# Patient Record
Sex: Female | Born: 1980
Health system: Southern US, Community
[De-identification: ages and names within clinical notes are randomized; demographics above are authoritative.]

## PROBLEM LIST (undated history)

## (undated) DIAGNOSIS — F419 Anxiety disorder, unspecified: Secondary | ICD-10-CM

## (undated) DIAGNOSIS — J309 Allergic rhinitis, unspecified: Secondary | ICD-10-CM

## (undated) DIAGNOSIS — R51 Headache: Secondary | ICD-10-CM

## (undated) DIAGNOSIS — M1990 Unspecified osteoarthritis, unspecified site: Secondary | ICD-10-CM

## (undated) DIAGNOSIS — J45909 Unspecified asthma, uncomplicated: Secondary | ICD-10-CM

## (undated) DIAGNOSIS — F3289 Other specified depressive episodes: Secondary | ICD-10-CM

## (undated) DIAGNOSIS — R000 Tachycardia, unspecified: Secondary | ICD-10-CM

## (undated) DIAGNOSIS — F329 Major depressive disorder, single episode, unspecified: Secondary | ICD-10-CM

## (undated) DIAGNOSIS — F1010 Alcohol abuse, uncomplicated: Secondary | ICD-10-CM

## (undated) HISTORY — DX: Other specified depressive episodes: F32.89

## (undated) HISTORY — PX: WISDOM TOOTH EXTRACTION: SHX21

## (undated) HISTORY — DX: Alcohol abuse, uncomplicated: F10.10

## (undated) HISTORY — DX: Unspecified asthma, uncomplicated: J45.909

## (undated) HISTORY — DX: Headache: R51

## (undated) HISTORY — DX: Major depressive disorder, single episode, unspecified: F32.9

## (undated) HISTORY — DX: Tachycardia, unspecified: R00.0

## (undated) HISTORY — DX: Allergic rhinitis, unspecified: J30.9

## (undated) HISTORY — DX: Unspecified osteoarthritis, unspecified site: M19.90

---

## 1998-11-02 ENCOUNTER — Other Ambulatory Visit: Admission: RE | Admit: 1998-11-02 | Discharge: 1998-11-02 | Payer: Self-pay | Admitting: *Deleted

## 2001-04-04 ENCOUNTER — Emergency Department (HOSPITAL_COMMUNITY): Admission: EM | Admit: 2001-04-04 | Discharge: 2001-04-04 | Payer: Self-pay | Admitting: Emergency Medicine

## 2004-02-17 ENCOUNTER — Ambulatory Visit: Payer: Self-pay | Admitting: *Deleted

## 2004-04-28 ENCOUNTER — Ambulatory Visit: Payer: Self-pay | Admitting: Family Medicine

## 2004-07-06 ENCOUNTER — Ambulatory Visit: Payer: Self-pay | Admitting: Family Medicine

## 2004-08-25 ENCOUNTER — Ambulatory Visit: Payer: Self-pay | Admitting: Internal Medicine

## 2004-09-11 ENCOUNTER — Emergency Department (HOSPITAL_COMMUNITY): Admission: EM | Admit: 2004-09-11 | Discharge: 2004-09-11 | Payer: Self-pay | Admitting: Family Medicine

## 2004-09-14 ENCOUNTER — Ambulatory Visit: Payer: Self-pay | Admitting: Family Medicine

## 2004-10-05 ENCOUNTER — Ambulatory Visit: Payer: Self-pay | Admitting: Family Medicine

## 2004-11-04 ENCOUNTER — Emergency Department (HOSPITAL_COMMUNITY): Admission: EM | Admit: 2004-11-04 | Discharge: 2004-11-04 | Payer: Self-pay | Admitting: Emergency Medicine

## 2004-11-08 ENCOUNTER — Ambulatory Visit: Payer: Self-pay | Admitting: Family Medicine

## 2005-05-01 ENCOUNTER — Emergency Department (HOSPITAL_COMMUNITY): Admission: EM | Admit: 2005-05-01 | Discharge: 2005-05-01 | Payer: Self-pay | Admitting: Emergency Medicine

## 2005-08-01 ENCOUNTER — Emergency Department (HOSPITAL_COMMUNITY): Admission: EM | Admit: 2005-08-01 | Discharge: 2005-08-01 | Payer: Self-pay | Admitting: Family Medicine

## 2006-03-17 ENCOUNTER — Emergency Department (HOSPITAL_COMMUNITY): Admission: EM | Admit: 2006-03-17 | Discharge: 2006-03-17 | Payer: Self-pay | Admitting: Emergency Medicine

## 2006-06-21 ENCOUNTER — Ambulatory Visit (HOSPITAL_COMMUNITY): Admission: RE | Admit: 2006-06-21 | Discharge: 2006-06-21 | Payer: Self-pay | Admitting: Internal Medicine

## 2007-01-27 ENCOUNTER — Emergency Department (HOSPITAL_COMMUNITY): Admission: EM | Admit: 2007-01-27 | Discharge: 2007-01-28 | Payer: Self-pay | Admitting: Emergency Medicine

## 2008-11-20 ENCOUNTER — Encounter: Admission: RE | Admit: 2008-11-20 | Discharge: 2009-02-18 | Payer: Self-pay | Admitting: Specialist

## 2010-04-19 ENCOUNTER — Ambulatory Visit: Payer: Self-pay | Admitting: Internal Medicine

## 2010-04-19 DIAGNOSIS — F418 Other specified anxiety disorders: Secondary | ICD-10-CM | POA: Insufficient documentation

## 2010-04-19 DIAGNOSIS — J309 Allergic rhinitis, unspecified: Secondary | ICD-10-CM | POA: Insufficient documentation

## 2010-04-19 DIAGNOSIS — M1990 Unspecified osteoarthritis, unspecified site: Secondary | ICD-10-CM | POA: Insufficient documentation

## 2010-04-19 DIAGNOSIS — R519 Headache, unspecified: Secondary | ICD-10-CM | POA: Insufficient documentation

## 2010-04-19 DIAGNOSIS — R51 Headache: Secondary | ICD-10-CM

## 2010-04-19 DIAGNOSIS — J4520 Mild intermittent asthma, uncomplicated: Secondary | ICD-10-CM | POA: Insufficient documentation

## 2010-06-15 NOTE — Assessment & Plan Note (Signed)
Summary: NEW PT/UMR/#/LB   Vital Signs:  Patient profile:   30 year old female Menstrual status:  regular LMP:     04/12/2010 Height:      59 inches Weight:      177 pounds BMI:     35.88 O2 Sat:      97 % on Room air Temp:     98.3 degrees F oral Pulse rate:   98 / minute Pulse rhythm:   regular Resp:     16 per minute BP sitting:   112 / 82  (left arm) Cuff size:   regular  Vitals Entered By: Rock Nephew CMA (April 19, 2010 10:52 AM)  Nutrition Counseling: Patient's BMI is greater than 25 and therefore counseled on weight management options.  O2 Flow:  Room air CC: new to establish Is Patient Diabetic? No  Does patient need assistance? Functional Status Self care Ambulation Normal LMP (date): 04/12/2010     Menstrual Status regular Enter LMP: 04/12/2010 Last PAP Result normal   Primary Care Provider:  Etta Grandchild MD  CC:  new to establish.  History of Present Illness: New to me for evaluation and treatment of depression. She has been declining this year after her brother died in 01-25-2011from cirrhosis induced by alcohol abuse. She has not done any therapy this year. Her prior PCP increased the dose of Effexor 4 months ago but she still has crying spells, fatigue, weight gain, and hypersomnolence.  Asthma History    Initial Asthma Severity Rating:    Age range: 12+ years    Symptoms: 0-2 days/week    Nighttime Awakenings: 0-2/month    Interferes w/ normal activity: no limitations    SABA use (not for EIB): 0-2 days/week    Asthma Severity Assessment: Intermittent  Preventive Screening-Counseling & Management  Alcohol-Tobacco     Alcohol drinks/day: 0     Alcohol Counseling: not indicated; patient does not drink     Smoking Status: never     Tobacco Counseling: not indicated; no tobacco use  Caffeine-Diet-Exercise     Does Patient Exercise: no  Hep-HIV-STD-Contraception     Hepatitis Risk: no risk noted     HIV Risk: no risk noted  STD Risk: no risk noted      Sexual History:  currently monogamous.        Drug Use:  no.        Blood Transfusions:  no.    Medications Prior to Update: 1)  None  Current Medications (verified): 1)  Singulair 10 Mg Tabs (Montelukast Sodium) .... Take 1 Tablet By Mouth Once A Day 2)  Zolpidem Tartrate 10 Mg Tabs (Zolpidem Tartrate) .... Take 1 Tab By Mouth At Bedtime 3)  Venlafaxine Hcl 225 Mg Xr24h-Tab (Venlafaxine Hcl) .... Take 1 Tablet By Mouth Once A Day 4)  Vitamin D 2000 Unit Tabs (Cholecalciferol) .... Take 1 Tablet By Mouth Once A Day 5)  Sprintec 28 0.25-35 Mg-Mcg Tabs (Norgestimate-Eth Estradiol) 6)  Budeprion Xl 150 Mg Xr24h-Tab (Bupropion Hcl) .... One By Mouth Once Daily For Depression  Allergies (verified): 1)  ! Penicillin  Past History:  Past Medical History: Allergic rhinitis Asthma Depression Headache Osteoarthritis  Past Surgical History: Denies surgical history  Family History: Family History of Alcoholism/Addiction Family History of Arthritis  Social History: Occupation: Research scientist (medical) Married Never Smoked Alcohol use-no Drug use-no Regular exercise-no Smoking Status:  never Hepatitis Risk:  no risk noted HIV Risk:  no risk noted STD  Risk:  no risk noted Sexual History:  currently monogamous Blood Transfusions:  no Drug Use:  no Does Patient Exercise:  no  Review of Systems       The patient complains of weight gain and depression.  The patient denies anorexia, fever, weight loss, chest pain, syncope, dyspnea on exertion, peripheral edema, prolonged cough, headaches, hemoptysis, abdominal pain, hematuria, suspicious skin lesions, unusual weight change, abnormal bleeding, and enlarged lymph nodes.   Resp:  Denies chest pain with inspiration, cough, coughing up blood, excessive snoring, hypersomnolence, pleuritic, shortness of breath, sputum productive, and wheezing. Psych:  Complains of depression, easily tearful, and irritability; denies  alternate hallucination ( auditory/visual), anxiety, easily angered, mental problems, panic attacks, sense of great danger, suicidal thoughts/plans, thoughts of violence, unusual visions or sounds, and thoughts /plans of harming others.  Physical Exam  General:  alert, well-developed, well-nourished, well-hydrated, appropriate dress, normal appearance, healthy-appearing, cooperative to examination, good hygiene, and overweight-appearing.   Head:  normocephalic, atraumatic, no abnormalities observed, and no abnormalities palpated.   Mouth:  good dentition and pharynx pink and moist.   Neck:  supple, full ROM, no masses, no thyromegaly, no thyroid nodules or tenderness, no JVD, normal carotid upstroke, no carotid bruits, no cervical lymphadenopathy, and no neck tenderness.   Lungs:  normal respiratory effort, no intercostal retractions, no accessory muscle use, normal breath sounds, no dullness, no fremitus, no crackles, and no wheezes.   Heart:  normal rate, regular rhythm, no murmur, no gallop, no rub, and no JVD.   Abdomen:  soft, non-tender, normal bowel sounds, no distention, no masses, no guarding, no rigidity, no rebound tenderness, no abdominal hernia, no inguinal hernia, no hepatomegaly, and no splenomegaly.   Msk:  normal ROM, no joint tenderness, no joint swelling, no joint warmth, no redness over joints, no joint deformities, no joint instability, and no crepitation.   Pulses:  R and L carotid,radial,femoral,dorsalis pedis and posterior tibial pulses are full and equal bilaterally Extremities:  No clubbing, cyanosis, edema, or deformity noted with normal full range of motion of all joints.   Neurologic:  No cranial nerve deficits noted. Station and gait are normal. Plantar reflexes are down-going bilaterally. DTRs are symmetrical throughout. Sensory, motor and coordinative functions appear intact. Skin:  Intact without suspicious lesions or rashes Cervical Nodes:  no anterior cervical  adenopathy and no posterior cervical adenopathy.   Psych:  Oriented X3, memory intact for recent and remote, normally interactive, good eye contact, not anxious appearing, not agitated, not suicidal, not homicidal, subdued, and tearful.     Impression & Recommendations:  Problem # 1:  DEPRESSION (ICD-311) Assessment Deteriorated  she was referred for psuchotherapy with Burnard Hawthorne The following medications were removed from the medication list:    Budeprion Sr 100 Mg Xr12h-tab (Bupropion hcl) .Marland Kitchen... Take 1 tablet by mouth once a day Her updated medication list for this problem includes:    Venlafaxine Hcl 225 Mg Xr24h-tab (Venlafaxine hcl) .Marland Kitchen... Take 1 tablet by mouth once a day    Budeprion Xl 150 Mg Xr24h-tab (Bupropion hcl) ..... One by mouth once daily for depression  Discussed treatment options, including trial of antidpressant medication. Will refer to behavioral health. Follow-up call in in 24-48 hours and recheck in 2 weeks, sooner as needed. Patient agrees to call if any worsening of symptoms or thoughts of doing harm arise. Verified that the patient has no suicidal ideation at this time.   Problem # 2:  ASTHMA (ICD-493.90) Assessment: Improved  Her  updated medication list for this problem includes:    Singulair 10 Mg Tabs (Montelukast sodium) .Marland Kitchen... Take 1 tablet by mouth once a day  Pulmonary Functions Reviewed: O2 sat: 97 (04/19/2010)  Complete Medication List: 1)  Singulair 10 Mg Tabs (Montelukast sodium) .... Take 1 tablet by mouth once a day 2)  Zolpidem Tartrate 10 Mg Tabs (Zolpidem tartrate) .... Take 1 tab by mouth at bedtime 3)  Venlafaxine Hcl 225 Mg Xr24h-tab (Venlafaxine hcl) .... Take 1 tablet by mouth once a day 4)  Vitamin D 2000 Unit Tabs (Cholecalciferol) .... Take 1 tablet by mouth once a day 5)  Sprintec 28 0.25-35 Mg-mcg Tabs (Norgestimate-eth estradiol) 6)  Budeprion Xl 150 Mg Xr24h-tab (Bupropion hcl) .... One by mouth once daily for  depression  Other Orders: Tdap => 79yrs IM (16109) Admin 1st Vaccine (60454)   Patient Instructions: 1)  Please schedule a follow-up appointment in 3 months. 2)  It is important that you exercise regularly at least 20 minutes 5 times a week. If you develop chest pain, have severe difficulty breathing, or feel very tired , stop exercising immediately and seek medical attention. 3)  You need to lose weight. Consider a lower calorie diet and regular exercise.  Prescriptions: BUDEPRION XL 150 MG XR24H-TAB (BUPROPION HCL) one by mouth once daily for depression  #30 x 11   Entered and Authorized by:   Etta Grandchild MD   Signed by:   Etta Grandchild MD on 04/19/2010   Method used:   Print then Give to Patient   RxID:   [id number]    Orders Added: 1)  Tdap => 38yrs IM [30865] 2)  Admin 1st Vaccine [90471] 3)  New Patient Level IV [78469]   Immunizations Administered:  Tetanus Vaccine:    Vaccine Type: Tdap    Site: right deltoid    Mfr: GlaxoSmithKline    Dose: 0.5 ml    Route: IM    Given by: Rock Nephew CMA    Exp. Date: 03/04/2012    Lot #: [id number]    VIS given: 04/02/08 version given April 19, 2010.   Immunizations Administered:  Tetanus Vaccine:    Vaccine Type: Tdap    Site: right deltoid    Mfr: [email or web address]    Dose: 0.5 ml    Route: IM    Given by: Rock Nephew CMA    Exp. Date: 03/04/2012    Lot #: [id number]    VIS given: 04/02/08 version given April 19, 2010.  Preventive Care Screening  Pap Smear:    Date:  12/14/2009    Results:  normal

## 2010-06-16 ENCOUNTER — Telehealth: Payer: Self-pay | Admitting: Internal Medicine

## 2010-06-18 ENCOUNTER — Ambulatory Visit: Payer: Self-pay | Admitting: Internal Medicine

## 2010-06-21 ENCOUNTER — Other Ambulatory Visit: Payer: Self-pay | Admitting: Internal Medicine

## 2010-06-21 ENCOUNTER — Other Ambulatory Visit: Payer: Commercial Managed Care - PPO

## 2010-06-21 ENCOUNTER — Encounter (INDEPENDENT_AMBULATORY_CARE_PROVIDER_SITE_OTHER): Payer: Self-pay | Admitting: *Deleted

## 2010-06-21 ENCOUNTER — Ambulatory Visit (INDEPENDENT_AMBULATORY_CARE_PROVIDER_SITE_OTHER): Payer: Commercial Managed Care - PPO | Admitting: Internal Medicine

## 2010-06-21 ENCOUNTER — Encounter: Payer: Self-pay | Admitting: Internal Medicine

## 2010-06-21 DIAGNOSIS — R112 Nausea with vomiting, unspecified: Secondary | ICD-10-CM | POA: Insufficient documentation

## 2010-06-21 DIAGNOSIS — F329 Major depressive disorder, single episode, unspecified: Secondary | ICD-10-CM

## 2010-06-21 DIAGNOSIS — R000 Tachycardia, unspecified: Secondary | ICD-10-CM | POA: Insufficient documentation

## 2010-06-21 DIAGNOSIS — K5289 Other specified noninfective gastroenteritis and colitis: Secondary | ICD-10-CM | POA: Insufficient documentation

## 2010-06-21 LAB — HEPATIC FUNCTION PANEL
ALT: 22 U/L (ref 0–35)
AST: 23 U/L (ref 0–37)
Alkaline Phosphatase: 79 U/L (ref 39–117)
Bilirubin, Direct: 0.1 mg/dL (ref 0.0–0.3)
Total Bilirubin: 0.2 mg/dL — ABNORMAL LOW (ref 0.3–1.2)

## 2010-06-21 LAB — CBC WITH DIFFERENTIAL/PLATELET
Eosinophils Relative: 1.4 % (ref 0.0–5.0)
HCT: 39.3 % (ref 36.0–46.0)
Lymphocytes Relative: 20.1 % (ref 12.0–46.0)
Lymphs Abs: 1.4 10*3/uL (ref 0.7–4.0)
Monocytes Relative: 4.3 % (ref 3.0–12.0)
Platelets: 325 10*3/uL (ref 150.0–400.0)
WBC: 7 10*3/uL (ref 4.5–10.5)

## 2010-06-21 LAB — BASIC METABOLIC PANEL
BUN: 4 mg/dL — ABNORMAL LOW (ref 6–23)
GFR: 88.86 mL/min (ref >60.00)
Potassium: 4.3 mEq/L (ref 3.5–5.1)
Sodium: 138 mEq/L (ref 135–145)

## 2010-06-21 LAB — TSH: TSH: 0.6 u[IU]/mL (ref 0.35–5.50)

## 2010-06-23 NOTE — Progress Notes (Signed)
Summary: Nausea  Phone Note Call from Patient   Summary of Call: Pt c/o nausea and will vomit if she eats any solids. Please advise.  Initial call taken by: Lamar Sprinkles, CMA,  June 16, 2010 5:00 PM  Follow-up for Phone Call        needs to be seen Follow-up by: Etta Grandchild MD,  June 16, 2010 5:02 PM  Additional Follow-up for Phone Call Additional follow up Details #1::        Pt informed, due to work she is unable to come into the office until Monday. Advised she keep a bland diet and avoid high acid liquids/foods. Also strongly suggested we needed to see her for eval soon and not to wait until Monday. Pt says she has nausea even w/small sips of liquids. Stressed eval and offered sooner apts - pt declined again.  Additional Follow-up by: Lamar Sprinkles, CMA,  June 16, 2010 5:36 PM

## 2010-07-01 NOTE — Assessment & Plan Note (Signed)
Summary: nausea/#   Vital Signs:  Patient profile:   30 year old female Menstrual status:  regular LMP:     06/14/2010 Height:      59 inches Weight:      169.75 pounds BMI:     34.41 O2 Sat:      97 % on Room air Temp:     98.4 degrees F oral Pulse rate:   116 / minute Pulse rhythm:   regular Resp:     16 per minute BP sitting:   102 / 70  (left arm) Cuff size:   regular  Vitals Entered By: Rock Nephew CMA (June 21, 2010 8:34 AM)  Nutrition Counseling: Patient's BMI is greater than 25 and therefore counseled on weight management options.  O2 Flow:  Room air CC: Patient c/o nausea and vomiting x last week Pain Assessment Patient in pain? no       Does patient need assistance? Functional Status Self care Ambulation Normal LMP (date): 06/14/2010     Enter LMP: 06/14/2010 Last PAP Result normal   Primary Care Provider:  Etta Grandchild MD  CC:  Patient c/o nausea and vomiting x last week.  History of Present Illness: She returns c/o a 5 day history of nausea with occasional vomiting. She tells me that her husband has had a stomach flu.  Also, she feels shaky and wants to lower the doses of her meds.  Preventive Screening-Counseling & Management  Alcohol-Tobacco     Alcohol drinks/day: 0     Alcohol Counseling: not indicated; patient does not drink     Smoking Status: never     Tobacco Counseling: not indicated; no tobacco use  Hep-HIV-STD-Contraception     Hepatitis Risk: no risk noted     HIV Risk: no risk noted     STD Risk: no risk noted      Sexual History:  currently monogamous.        Drug Use:  no.        Blood Transfusions:  no.    Medications Prior to Update: 1)  Singulair 10 Mg Tabs (Montelukast Sodium) .... Take 1 Tablet By Mouth Once A Day 2)  Zolpidem Tartrate 10 Mg Tabs (Zolpidem Tartrate) .... Take 1 Tab By Mouth At Bedtime 3)  Venlafaxine Hcl 225 Mg Xr24h-Tab (Venlafaxine Hcl) .... Take 1 Tablet By Mouth Once A Day 4)   Vitamin D 2000 Unit Tabs (Cholecalciferol) .... Take 1 Tablet By Mouth Once A Day 5)  Sprintec 28 0.25-35 Mg-Mcg Tabs (Norgestimate-Eth Estradiol) 6)  Budeprion Xl 150 Mg Xr24h-Tab (Bupropion Hcl) .... One By Mouth Once Daily For Depression  Current Medications (verified): 1)  Singulair 10 Mg Tabs (Montelukast Sodium) .... Take 1 Tablet By Mouth Once A Day 2)  Zolpidem Tartrate 10 Mg Tabs (Zolpidem Tartrate) .... Take 1 Tab By Mouth At Bedtime 3)  Vitamin D 2000 Unit Tabs (Cholecalciferol) .... Take 1 Tablet By Mouth Once A Day 4)  Sprintec 28 0.25-35 Mg-Mcg Tabs (Norgestimate-Eth Estradiol) 5)  Budeprion Xl 150 Mg Xr24h-Tab (Bupropion Hcl) .... One By Mouth Once Daily For Depression 6)  Propranolol .... As Needed For Headaches 7)  Venlafaxine Hcl 150 Mg Xr24h-Tab (Venlafaxine Hcl) .... One By Mouth Once Daily 8)  Promethazine Hcl 25 Mg Tabs (Promethazine Hcl) .... 1/2-1 By Mouth Qid As Needed For Nausea or Vomiting  Allergies (verified): 1)  ! Penicillin  Past History:  Past Medical History: Last updated: 04/19/2010 Allergic rhinitis Asthma Depression Headache  Osteoarthritis  Past Surgical History: Last updated: 04/19/2010 Denies surgical history  Family History: Last updated: 04/19/2010 Family History of Alcoholism/Addiction Family History of Arthritis  Social History: Last updated: 04/19/2010 Occupation: dog groomer Married Never Smoked Alcohol use-no Drug use-no Regular exercise-no  Risk Factors: Alcohol Use: 0 (06/21/2010) Exercise: no (04/19/2010)  Risk Factors: Smoking Status: never (06/21/2010)  Family History: Reviewed history from 04/19/2010 and no changes required. Family History of Alcoholism/Addiction Family History of Arthritis  Social History: Reviewed history from 04/19/2010 and no changes required. 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   GI:  Complains of nausea and vomiting; denies abdominal pain, bloody stools, change in bowel habits, diarrhea, excessive appetite, gas, hemorrhoids, indigestion, loss of appetite, vomiting blood, and yellowish skin color. Psych:  Complains of depression; denies alternate hallucination ( auditory/visual), anxiety, easily angered, easily tearful, irritability, mental problems, panic attacks, sense of great danger, suicidal thoughts/plans, thoughts of violence, unusual visions or sounds, and thoughts /plans of harming others.  Physical Exam  General:  alert, well-developed, well-nourished, well-hydrated, appropriate dress, normal appearance, healthy-appearing, cooperative to examination, good hygiene, and overweight-appearing.   Head:  normocephalic, atraumatic, no abnormalities observed, and no abnormalities palpated.   Eyes:  vision grossly intact, pupils equal, and no injection or injection. Mouth:  good dentition and pharynx pink and moist.   Neck:  supple, full ROM, no masses, no thyromegaly, no thyroid nodules or tenderness, no JVD, normal carotid upstroke, no carotid bruits, no cervical lymphadenopathy, and no neck tenderness.   Lungs:  normal respiratory effort, no intercostal retractions, no accessory muscle use, normal breath sounds, no dullness, no fremitus, no crackles, and no wheezes.   Heart:  normal rate, regular rhythm, no murmur, no gallop, no rub, and no JVD.   Abdomen:  soft, non-tender, normal bowel sounds, no distention, no masses, no guarding, no rigidity, no rebound tenderness, no abdominal hernia, no inguinal hernia, no hepatomegaly, and no splenomegaly.   Msk:  normal ROM, no joint tenderness, no  joint swelling, no joint warmth, no redness over joints, no joint deformities, no joint instability, and no crepitation.   Pulses:  R and L carotid,radial,femoral,dorsalis pedis and posterior tibial pulses are full and equal bilaterally Extremities:  No clubbing, cyanosis, edema, or deformity noted with normal full range of motion of all joints.   Neurologic:  No cranial nerve deficits noted. Station and gait are normal. Plantar reflexes are down-going bilaterally. DTRs are symmetrical throughout. Sensory, motor and coordinative functions appear intact. Skin:  turgor normal, color normal, no rashes, no suspicious lesions, no ecchymoses, no petechiae, no purpura, no ulcerations, and no edema.   Cervical Nodes:  no anterior cervical adenopathy and no posterior cervical adenopathy.   Axillary Nodes:  no R axillary adenopathy and no L axillary adenopathy.   Psych:  Oriented X3, memory intact for recent and remote, normally interactive, good eye contact, not anxious appearing, not depressed appearing, not agitated, not suicidal, and not homicidal.     Impression & Recommendations:  Problem # 1:  GASTROENTERITIS (ICD-558.9) Assessment New will try phenergan for symptoms and encourage increased fluid intake  Problem # 2:  UNSPECIFIED TACHYCARDIA (ICD-785.0)  Orders: Venipuncture (91478) TLB-BMP (Basic Metabolic Panel-BMET) (80048-METABOL) TLB-CBC Platelet -  w/Differential (85025-CBCD) TLB-Hepatic/Liver Function Pnl (80076-HEPATIC) TLB-TSH (Thyroid Stimulating Hormone) (84443-TSH) TLB-Amylase (82150-AMYL) TLB-Lipase (83690-LIPASE) TLB-Preg Serum Quant (B-hCG) (84702-HCG-QN)  Problem # 3:  NAUSEA AND VOMITING (ICD-787.01) will check labs for dehydration, anemia, thyroid disease, abnormal lytes, etc. Orders: Venipuncture (16109) TLB-BMP (Basic Metabolic Panel-BMET) (80048-METABOL) TLB-CBC Platelet - w/Differential (85025-CBCD) TLB-Hepatic/Liver Function Pnl (80076-HEPATIC) TLB-TSH (Thyroid  Stimulating Hormone) (84443-TSH) TLB-Amylase (82150-AMYL) TLB-Lipase (83690-LIPASE) TLB-Preg Serum Quant (B-hCG) (84702-HCG-QN)  Problem # 4:  DEPRESSION (ICD-311) Assessment: Unchanged  The following medications were removed from the medication list:    Venlafaxine Hcl 225 Mg Xr24h-tab (Venlafaxine hcl) .Marland Kitchen... Take 1 tablet by mouth once a day Her updated medication list for this problem includes:    Budeprion Xl 150 Mg Xr24h-tab (Bupropion hcl) ..... One by mouth once daily for depression    Venlafaxine Hcl 150 Mg Xr24h-tab (Venlafaxine hcl) ..... One by mouth once daily  Orders: Venipuncture (60454) TLB-BMP (Basic Metabolic Panel-BMET) (80048-METABOL) TLB-CBC Platelet - w/Differential (85025-CBCD) TLB-Hepatic/Liver Function Pnl (80076-HEPATIC) TLB-TSH (Thyroid Stimulating Hormone) (84443-TSH) TLB-Amylase (82150-AMYL) TLB-Lipase (83690-LIPASE) TLB-Preg Serum Quant (B-hCG) (84702-HCG-QN)  Discussed treatment options, including trial of antidpressant medication. Will refer to behavioral health. Follow-up call in in 24-48 hours and recheck in 2 weeks, sooner as needed. Patient agrees to call if any worsening of symptoms or thoughts of doing harm arise. Verified that the patient has no suicidal ideation at this time.   Complete Medication List: 1)  Singulair 10 Mg Tabs (Montelukast sodium) .... Take 1 tablet by mouth once a day 2)  Zolpidem Tartrate 10 Mg Tabs (Zolpidem tartrate) .... Take 1 tab by mouth at bedtime 3)  Vitamin D 2000 Unit Tabs (Cholecalciferol) .... Take 1 tablet by mouth once a day 4)  Sprintec 28 0.25-35 Mg-mcg Tabs (Norgestimate-eth estradiol) 5)  Budeprion Xl 150 Mg Xr24h-tab (Bupropion hcl) .... One by mouth once daily for depression 6)  Propranolol  .... As needed for headaches 7)  Venlafaxine Hcl 150 Mg Xr24h-tab (Venlafaxine hcl) .... One by mouth once daily 8)  Promethazine Hcl 25 Mg Tabs (Promethazine hcl) .... 1/2-1 by mouth qid as needed for nausea or  vomiting  Patient Instructions: 1)  Please schedule a follow-up appointment in 2 weeks. 2)  teh main problem with gastroenteritis is dehydration. Drink plenty of fluids and take solids as you feel better. If you are unable to keep anything down and/or you show signs of dehydration(dry/cracked lips, lack of tears, not urinating, very sleepy), call our office. Prescriptions: PROMETHAZINE HCL 25 MG TABS (PROMETHAZINE HCL) 1/2-1 by mouth QID as needed for nausea or vomiting  #20 x 0   Entered and Authorized by:   Etta Grandchild MD   Signed by:   Etta Grandchild MD on 06/21/2010   Method used:   Print then Give to Patient   RxID:   [id number] VENLAFAXINE HCL 150 MG XR24H-TAB (VENLAFAXINE HCL) One by mouth once daily  #30 x 11   Entered and Authorized by:   Etta Grandchild MD   Signed by:   Etta Grandchild MD on 06/21/2010   Method used:   Print then Give to Patient   RxID:   [id number]    Orders Added: 1)  Venipuncture [41324] 2)  TLB-BMP (Basic Metabolic Panel-BMET) [80048-METABOL] 3)  TLB-CBC Platelet - w/Differential [85025-CBCD] 4)  TLB-Hepatic/Liver Function Pnl [80076-HEPATIC] 5)  TLB-TSH (Thyroid Stimulating Hormone) [84443-TSH] 6)  TLB-Amylase [82150-AMYL] 7)  TLB-Lipase [83690-LIPASE] 8)  TLB-Preg Serum Quant (B-hCG) [84702-HCG-QN] 9)  Est. Patient Level IV [  99214] 

## 2010-07-23 ENCOUNTER — Encounter: Payer: Self-pay | Admitting: Internal Medicine

## 2010-07-23 ENCOUNTER — Ambulatory Visit (INDEPENDENT_AMBULATORY_CARE_PROVIDER_SITE_OTHER): Payer: Commercial Managed Care - PPO | Admitting: Internal Medicine

## 2010-07-23 DIAGNOSIS — J209 Acute bronchitis, unspecified: Secondary | ICD-10-CM

## 2010-07-26 ENCOUNTER — Telehealth: Payer: Self-pay | Admitting: Internal Medicine

## 2010-07-26 ENCOUNTER — Ambulatory Visit: Payer: Commercial Managed Care - PPO | Admitting: Internal Medicine

## 2010-07-27 NOTE — Assessment & Plan Note (Signed)
Summary: NAUSEA / HOARSNESS /NWS   Vital Signs:  Patient profile:   30 year old female Menstrual status:  regular LMP:     07/06/2010 Height:      59 inches Weight:      170 pounds BMI:     34.46 O2 Sat:      97 % on Room air Temp:     98.8 degrees F oral Pulse rate:   94 / minute Pulse rhythm:   regular Resp:     16 per minute BP sitting:   104 / 70  (left arm) Cuff size:   large  Vitals Entered By: Rock Nephew CMA (July 23, 2010 8:25 AM)  Nutrition Counseling: Patient's BMI is greater than 25 and therefore counseled on weight management options.  O2 Flow:  Room air  Primary Care Provider:  Etta Grandchild MD  CC:  URI symptoms.  History of Present Illness:  URI Symptoms      This is a 30 year old woman who presents with URI symptoms.  The symptoms began 3 days ago.  The severity is described as moderate.  The patient reports sore throat, productive cough, and sick contacts, but denies nasal congestion, clear nasal discharge, purulent nasal discharge, dry cough, and earache.  Associated symptoms include low-grade fever (<100.5 degrees).  The patient denies stiff neck, dyspnea, wheezing, rash, vomiting, diarrhea, use of an antipyretic, and response to antipyretic.  The patient also reports severe fatigue.  The patient denies itchy watery eyes, itchy throat, sneezing, seasonal symptoms, response to antihistamine, headache, and muscle aches.  The patient denies the following risk factors for Strep sinusitis: unilateral facial pain, unilateral nasal discharge, poor response to decongestant, double sickening, tooth pain, Strep exposure, tender adenopathy, and absence of cough.    Preventive Screening-Counseling & Management  Alcohol-Tobacco     Alcohol drinks/day: 0     Alcohol Counseling: not indicated; patient does not drink     Smoking Status: never     Tobacco Counseling: not indicated; no tobacco use  Hep-HIV-STD-Contraception     Hepatitis Risk: no risk noted     HIV  Risk: no risk noted     STD Risk: no risk noted      Sexual History:  currently monogamous.        Drug Use:  no.        Blood Transfusions:  no.    Clinical Review Panels:  Prevention   Last Pap Smear:  normal (12/14/2009)  Immunizations   Last Tetanus Booster:  Tdap (04/19/2010)  Diabetes Management   Creatinine:  0.8 (06/21/2010)  CBC   WBC:  7.0 (06/21/2010)   RBC:  4.39 (06/21/2010)   Hgb:  13.4 (06/21/2010)   Hct:  39.3 (06/21/2010)   Platelets:  325.0 (06/21/2010)   MCV  89.5 (06/21/2010)   MCHC  34.1 (06/21/2010)   RDW  13.2 (06/21/2010)   PMN:  73.5 (06/21/2010)   Lymphs:  20.1 (06/21/2010)   Monos:  4.3 (06/21/2010)   Eosinophils:  1.4 (06/21/2010)   Basophil:  0.7 (06/21/2010)  Complete Metabolic Panel   Glucose:  97 (06/21/2010)   Sodium:  138 (06/21/2010)   Potassium:  4.3 (06/21/2010)   Chloride:  104 (06/21/2010)   CO2:  27 (06/21/2010)   BUN:  4 (06/21/2010)   Creatinine:  0.8 (06/21/2010)   Albumin:  3.6 (06/21/2010)   Total Protein:  7.3 (06/21/2010)   Calcium:  8.5 (06/21/2010)   Total Bili:  0.2 (06/21/2010)  Alk Phos:  79 (06/21/2010)   SGPT (ALT):  22 (06/21/2010)   SGOT (AST):  23 (06/21/2010)   Medications Prior to Update: 1)  Singulair 10 Mg Tabs (Montelukast Sodium) .... Take 1 Tablet By Mouth Once A Day 2)  Zolpidem Tartrate 10 Mg Tabs (Zolpidem Tartrate) .... Take 1 Tab By Mouth At Bedtime 3)  Vitamin D 2000 Unit Tabs (Cholecalciferol) .... Take 1 Tablet By Mouth Once A Day 4)  Sprintec 28 0.25-35 Mg-Mcg Tabs (Norgestimate-Eth Estradiol) 5)  Budeprion Xl 150 Mg Xr24h-Tab (Bupropion Hcl) .... One By Mouth Once Daily For Depression 6)  Propranolol .... As Needed For Headaches 7)  Venlafaxine Hcl 150 Mg Xr24h-Tab (Venlafaxine Hcl) .... One By Mouth Once Daily 8)  Promethazine Hcl 25 Mg Tabs (Promethazine Hcl) .... 1/2-1 By Mouth Qid As Needed For Nausea or Vomiting  Current Medications (verified): 1)  Singulair 10 Mg Tabs  (Montelukast Sodium) .... Take 1 Tablet By Mouth Once A Day 2)  Zolpidem Tartrate 10 Mg Tabs (Zolpidem Tartrate) .... Take 1 Tab By Mouth At Bedtime 3)  Vitamin D 2000 Unit Tabs (Cholecalciferol) .... Take 1 Tablet By Mouth Once A Day 4)  Sprintec 28 0.25-35 Mg-Mcg Tabs (Norgestimate-Eth Estradiol) 5)  Budeprion Xl 150 Mg Xr24h-Tab (Bupropion Hcl) .... One By Mouth Once Daily For Depression 6)  Propranolol .... As Needed For Headaches 7)  Venlafaxine Hcl 150 Mg Xr24h-Tab (Venlafaxine Hcl) .... One By Mouth Once Daily 8)  Avelox 400 Mg Tabs (Moxifloxacin Hcl) .... One By Mouth Once Daily 9)  Zutripro 60-4-5 Mg/33ml Soln (Pseudoeph-Chlorphen-Hydrocod) .Marland Kitchen.. 1 Tsp By Mouth Qid As Needed For Cough and Congestion  Allergies (verified): 1)  ! Penicillin  Past History:  Past Medical History: Last updated: 04/19/2010 Allergic rhinitis Asthma Depression Headache Osteoarthritis  Past Surgical History: Last updated: 04/19/2010 Denies surgical history  Family History: Last updated: 04/19/2010 Family History of Alcoholism/Addiction Family History of Arthritis  Social History: Last updated: 04/19/2010 Occupation: dog groomer Married Never Smoked Alcohol use-no Drug use-no Regular exercise-no  Risk Factors: Alcohol Use: 0 (07/23/2010) Exercise: no (04/19/2010)  Risk Factors: Smoking Status: never (07/23/2010)  Family History: Reviewed history from 04/19/2010 and no changes required. Family History of Alcoholism/Addiction Family History of Arthritis  Social History: Reviewed history from 04/19/2010 and no changes required. Occupation: dog groomer Married Never Smoked Alcohol use-no Drug use-no Regular exercise-no  Review of Systems       The patient complains of hoarseness.  The patient denies anorexia, weight loss, weight gain, decreased hearing, chest pain, syncope, dyspnea on exertion, peripheral edema, headaches, hemoptysis, abdominal pain, hematuria, suspicious skin  lesions, transient blindness, and enlarged lymph nodes.    Physical Exam  General:  alert, well-developed, well-nourished, well-hydrated, appropriate dress, normal appearance, healthy-appearing, cooperative to examination, good hygiene, and overweight-appearing.   Head:  normocephalic, atraumatic, no abnormalities observed, and no abnormalities palpated.   Eyes:  vision grossly intact, pupils equal, and no injection or injection. Ears:  R ear normal and L ear normal.   Nose:  External nasal examination shows no deformity or inflammation. Nasal mucosa are pink and moist without lesions or exudates. Mouth:  good dentition, no exudates, no postnasal drip, no pharyngeal crowing, no lesions, no aphthous ulcers, no erosions, no tongue abnormalities, no leukoplakia, no petechiae, pharyngeal erythema, and posterior lymphoid hypertrophy.   Neck:  supple, full ROM, no masses, no thyromegaly, no thyroid nodules or tenderness, no JVD, normal carotid upstroke, no carotid bruits, no cervical lymphadenopathy, and no neck  tenderness.   Lungs:  normal respiratory effort, no intercostal retractions, no accessory muscle use, normal breath sounds, no dullness, no fremitus, no crackles, and no wheezes.   Heart:  normal rate, regular rhythm, no murmur, no gallop, no rub, and no JVD.   Abdomen:  soft, non-tender, normal bowel sounds, no distention, no masses, no guarding, no rigidity, no rebound tenderness, no abdominal hernia, no inguinal hernia, no hepatomegaly, and no splenomegaly.   Msk:  normal ROM, no joint tenderness, no joint swelling, no joint warmth, no redness over joints, no joint deformities, no joint instability, and no crepitation.   Pulses:  R and L carotid,radial,femoral,dorsalis pedis and posterior tibial pulses are full and equal bilaterally Extremities:  No clubbing, cyanosis, edema, or deformity noted with normal full range of motion of all joints.   Neurologic:  No cranial nerve deficits noted.  Station and gait are normal. Plantar reflexes are down-going bilaterally. DTRs are symmetrical throughout. Sensory, motor and coordinative functions appear intact. Skin:  turgor normal, color normal, no rashes, no suspicious lesions, no ecchymoses, no petechiae, no purpura, no ulcerations, and no edema.   Cervical Nodes:  no anterior cervical adenopathy and no posterior cervical adenopathy.   Psych:  Oriented X3, memory intact for recent and remote, normally interactive, good eye contact, not anxious appearing, not depressed appearing, not agitated, not suicidal, and not homicidal.     Impression & Recommendations:  Problem # 1:  BRONCHITIS-ACUTE (ICD-466.0) Assessment New  Her updated medication list for this problem includes:    Singulair 10 Mg Tabs (Montelukast sodium) .Marland Kitchen... Take 1 tablet by mouth once a day    Avelox 400 Mg Tabs (Moxifloxacin hcl) ..... One by mouth once daily    Zutripro 60-4-5 Mg/85ml Soln (Pseudoeph-chlorphen-hydrocod) .Marland Kitchen... 1 tsp by mouth qid as needed for cough and congestion  Take antibiotics and other medications as directed. Encouraged to push clear liquids, get enough rest, and take acetaminophen as needed. To be seen in 5-7 days if no improvement, sooner if worse.  Complete Medication List: 1)  Singulair 10 Mg Tabs (Montelukast sodium) .... Take 1 tablet by mouth once a day 2)  Zolpidem Tartrate 10 Mg Tabs (Zolpidem tartrate) .... Take 1 tab by mouth at bedtime 3)  Vitamin D 2000 Unit Tabs (Cholecalciferol) .... Take 1 tablet by mouth once a day 4)  Sprintec 28 0.25-35 Mg-mcg Tabs (Norgestimate-eth estradiol) 5)  Budeprion Xl 150 Mg Xr24h-tab (Bupropion hcl) .... One by mouth once daily for depression 6)  Propranolol  .... As needed for headaches 7)  Venlafaxine Hcl 150 Mg Xr24h-tab (Venlafaxine hcl) .... One by mouth once daily 8)  Avelox 400 Mg Tabs (Moxifloxacin hcl) .... One by mouth once daily 9)  Zutripro 60-4-5 Mg/98ml Soln (Pseudoeph-chlorphen-hydrocod)  .Marland Kitchen.. 1 tsp by mouth qid as needed for cough and congestion  Patient Instructions: 1)  Please schedule a follow-up appointment in 2 weeks. 2)  Take your antibiotic as prescribed until ALL of it is gone, but stop if you develop a rash or swelling and contact our office as soon as possible. 3)  Acute bronchitis symptoms for less than 10 days are not helped by antibiotics. take over the counter cough medications. call if no improvment in  5-7 days, sooner if increasing cough, fever, or new symptoms( shortness of breath, chest pain). Prescriptions: ZUTRIPRO 60-4-5 MG/5ML SOLN (PSEUDOEPH-CHLORPHEN-HYDROCOD) 1 tsp by mouth QID as needed for cough and congestion  #60 ml x 0   Entered and Authorized by:  Etta Grandchild MD   Signed by:   Etta Grandchild MD on 07/23/2010   Method used:   Samples Given   RxID:   [id number] AVELOX 400 MG TABS (MOXIFLOXACIN HCL) One by mouth once daily  #5 x 0   Entered and Authorized by:   Etta Grandchild MD   Signed by:   Etta Grandchild MD on 07/23/2010   Method used:   Samples Given   RxID:   [id number]    Orders Added: 1)  Est. Patient Level IV [84696]

## 2010-08-02 ENCOUNTER — Ambulatory Visit: Payer: Commercial Managed Care - PPO | Admitting: Internal Medicine

## 2010-08-03 NOTE — Progress Notes (Signed)
Summary: RF Cough med  Phone Note Call from Patient   Summary of Call: Patient is requesting refill of cough med, this has helped her cough but she is almost out - [facility]  Initial call taken by: Lamar Sprinkles, CMA,  July 26, 2010 2:47 PM  Follow-up for Phone Call        left vm for pt on hm # Follow-up by: Lamar Sprinkles, CMA,  July 26, 2010 6:22 PM    Prescriptions: ZUTRIPRO 60-4-5 MG/5ML SOLN (PSEUDOEPH-CHLORPHEN-HYDROCOD) 1 tsp by mouth QID as needed for cough and congestion  #60 ml x 0   Entered by:   Lamar Sprinkles, CMA   Authorized by:   Etta Grandchild MD   Signed by:   Lamar Sprinkles, CMA on 07/26/2010   Method used:   Telephoned to ...       [facility] [address] [phone]* (retail)       [address]       [place], [place]  [address]       Ph: [phone] or [phone]       Fax: [phone]   RxID:   [id number] ZUTRIPRO 60-4-5 MG/5ML SOLN (PSEUDOEPH-CHLORPHEN-HYDROCOD) 1 tsp by mouth QID as needed for cough and congestion  #60 ml x 0   Entered and Authorized by:   Etta Grandchild MD   Signed by:   Etta Grandchild MD on 07/26/2010   Method used:   Historical   RxID:   [id number]

## 2010-08-26 ENCOUNTER — Telehealth: Payer: Self-pay | Admitting: Internal Medicine

## 2010-08-26 MED ORDER — METHYLPREDNISOLONE 4 MG PO KIT: PACK | ORAL | Status: DC

## 2010-08-26 NOTE — Telephone Encounter (Signed)
Please call in the medrol dose pak

## 2010-08-26 NOTE — Telephone Encounter (Signed)
Pt states that her allergies are far worse than last season and is requesting a new Rx be sent in. Please advise.

## 2010-08-31 NOTE — Telephone Encounter (Signed)
Pt informed

## 2010-09-24 ENCOUNTER — Inpatient Hospital Stay (INDEPENDENT_AMBULATORY_CARE_PROVIDER_SITE_OTHER)
Admission: RE | Admit: 2010-09-24 | Discharge: 2010-09-24 | Disposition: A | Discharge status: Home / Self Care | Payer: Commercial Managed Care - PPO | Source: Ambulatory Visit | Attending: Emergency Medicine | Admitting: Emergency Medicine

## 2010-09-24 DIAGNOSIS — S61409A Unspecified open wound of unspecified hand, initial encounter: Secondary | ICD-10-CM

## 2010-09-27 ENCOUNTER — Encounter: Payer: Self-pay | Admitting: Internal Medicine

## 2010-09-27 DIAGNOSIS — Z0000 Encounter for general adult medical examination without abnormal findings: Secondary | ICD-10-CM | POA: Insufficient documentation

## 2010-09-29 ENCOUNTER — Ambulatory Visit: Payer: Commercial Managed Care - PPO | Admitting: Internal Medicine

## 2010-10-05 ENCOUNTER — Ambulatory Visit (INDEPENDENT_AMBULATORY_CARE_PROVIDER_SITE_OTHER): Payer: Commercial Managed Care - PPO | Admitting: Internal Medicine

## 2010-10-05 VITALS — BP 118/62 | HR 104 | Temp 98.6°F | Wt 172.0 lb

## 2010-10-05 DIAGNOSIS — S61409A Unspecified open wound of unspecified hand, initial encounter: Secondary | ICD-10-CM

## 2010-10-05 DIAGNOSIS — W5501XA Bitten by cat, initial encounter: Secondary | ICD-10-CM

## 2010-10-05 NOTE — Progress Notes (Signed)
  Subjective:    Patient ID: Patty Stanley, female    DOB: Jun 11, 1980, 30 y.o.   MRN: [id number]  HPI Patient presents for follow-up of cat bite. She was bitten in the web space between thumb and index finger, right at the nuckle, left hand about 10 days ago. She reports she had swelling and discoloration .She was seen by Dr. Lorenz Coaster at Urgent Care and was treated with clindamycin. The swelling, redness and pain resolved. She has a small residual nodule at the base of the MCP joint left index.  Past Medical History  Diagnosis Date  . DEPRESSION 04/19/2010  . ALLERGIC RHINITIS 04/19/2010  . ASTHMA 04/19/2010  . OSTEOARTHRITIS 04/19/2010  . Headache 04/19/2010  . GASTROENTERITIS 06/21/2010  . UNSPECIFIED TACHYCARDIA 06/21/2010  . NAUSEA AND VOMITING 06/21/2010   No past surgical history on file. Family History  Problem Relation Age of Onset  . Alcohol abuse Other   . Arthritis Other    History   Social History  . Marital Status: Married    Spouse Name: N/A    Number of Children: N/A  . Years of Education: N/A   Occupational History  . Not on file.   Social History Main Topics  . Smoking status: Never Smoker   . Smokeless tobacco: Not on file  . Alcohol Use: No  . Drug Use: No  . Sexually Active: Not on file   Other Topics Concern  . Not on file   Social History Narrative  . No narrative on file       Review of Systems Review of Systems  Constitutional:  Negative for fever, chills, activity change and unexpected weight change.  HENT:  Negative for hearing loss, ear pain, congestion, neck stiffness and postnasal drip.   Eyes: Negative for pain, discharge and visual disturbance.  Respiratory: Negative for chest tightness and wheezing.   Cardiovascular: Negative for chest pain and palpitations.       [No decreased exercise tolerance Gastrointestinal: [No change in bowel habit. No bloating or gas. No reflux or indigestion Genitourinary: Negative for urgency, frequency, flank  pain and difficulty urinating.  Musculoskeletal: Negative for myalgias, back pain, arthralgias and gait problem.  Neurological: Negative for dizziness, tremors, weakness and headaches.  Hematological: Negative for adenopathy.  Psychiatric/Behavioral: Negative for behavioral problems and dysphoric mood.       Objective:   Physical Exam Vitals reviewed. Resp - noraml Cor - RRR Derm - left hand without redness, swelling or tenderness. Minimal nodule, less than 1mm, at knuckle.       Assessment & Plan:  1. Cat bite - Reviewed patient materials from urgent care. Problem  resolved.

## 2010-10-14 ENCOUNTER — Encounter: Payer: Self-pay | Admitting: Internal Medicine

## 2010-10-15 ENCOUNTER — Ambulatory Visit (INDEPENDENT_AMBULATORY_CARE_PROVIDER_SITE_OTHER): Payer: Commercial Managed Care - PPO | Admitting: Internal Medicine

## 2010-10-15 ENCOUNTER — Encounter: Payer: Self-pay | Admitting: Internal Medicine

## 2010-10-15 ENCOUNTER — Ambulatory Visit (INDEPENDENT_AMBULATORY_CARE_PROVIDER_SITE_OTHER)
Admission: RE | Admit: 2010-10-15 | Discharge: 2010-10-15 | Disposition: A | Discharge status: Home / Self Care | Payer: Commercial Managed Care - PPO | Source: Ambulatory Visit | Attending: Internal Medicine | Admitting: Internal Medicine

## 2010-10-15 VITALS — BP 120/82 | HR 86 | Temp 98.0°F | Resp 16 | Wt 171.0 lb

## 2010-10-15 DIAGNOSIS — S6720XA Crushing injury of unspecified hand, initial encounter: Secondary | ICD-10-CM

## 2010-10-15 DIAGNOSIS — T07XXXA Unspecified multiple injuries, initial encounter: Secondary | ICD-10-CM

## 2010-10-15 DIAGNOSIS — W5501XA Bitten by cat, initial encounter: Secondary | ICD-10-CM

## 2010-10-15 DIAGNOSIS — IMO0001 Reserved for inherently not codable concepts without codable children: Secondary | ICD-10-CM

## 2010-10-15 NOTE — Patient Instructions (Signed)
Cat Bite Cat bites need to be cleaned very well. This is because a cat's teeth and mouth carry germs. These germs can cause a serious infection if not treated carefully. Contact your local animal control or the police. The biting animal may need to be watched (quarantined) for 10 days to see if rabies develops in the animal. Ask your doctor if you need a rabies shot.  HOME CARE  Keep the wound clean, dry, and bandaged.   Raise (elevate) the injured part if possible.   Do not use the affected area until your doctor says it is okay.   Take all medicine as told by your doctor. Finish all medicines even if you start to feel better.   Follow up with your doctor. Any delay in follow-up care could lead to a serious infection.  You might need a tetanus shot if:  You cannot remember when your last tetanus shot was.   You have never had a tetanus shot.   If the bite broke your skin.  GET HELP IF:  The bite wound becomes red and sore.   The bite wound becomes puffy (swollen).   There is more pain in the bite wound.   Fluid (pus) comes from the bite wound.   A bad smell is coming from the bite wound or bandage (dressing).   You or your child has a temperature by mouth above 100.5.  GET HELP RIGHT AWAY IF:  You or your child has a temperature by mouth above 100.5, not controlled by medicine.   You feel sick to your stomach (nauseous) or throw up (vomit).   You have chills.   The pain is so bad you cannot move your joint(s).  MAKE SURE YOU:  Understand these instructions.   Will watch your condition.   Will get help right away if you are not doing well or get worse.  Document Released: 10/26/2000 Document Re-Released: 10/20/2009 [facility] Patient Information 2011 [place], [place].Plantar Fasciitis Plantar fasciitis is a common condition that causes foot pain. It is soreness (inflammation) of the band of tough fibrous tissue on the bottom of the foot that runs from the heel bone  (calcaneus) to the ball of the foot. The cause of this soreness may be from excessive standing, poor fitting shoes, running on hard surfaces, being overweight, having an abnormal walk, or overuse (this is common in runners) of the painful foot or feet. It is also common in aerobic exercise dancers and ballet dancers.  SYMPTOMS Most people with plantar fasciitis complain of:  Severe pain in the morning on the bottom of their foot especially when taking the first steps out of bed. This pain recedes after a few minutes of walking.   Severe pain is experienced also during walking following a long period of inactivity.   Pain is worse when walking barefoot or up stairs  DIAGNOSIS  Your caregiver will diagnose this condition by examining and feeling your foot.   Special tests such as x-rays of your foot, are usually not needed.  PREVENTION  Consult a sports medicine professional before beginning a new exercise program.   Walking programs offer a good workout. With walking there is a lower chance of overuse injuries common to runners. There is less impact and less jarring of the joints.   Begin all new exercise programs slowly. If problems or pain develop, decrease the amount of time or distance until you are at a comfortable level.   Wear good shoes and replace them  regularly.   Stretch your foot and the heel cords at the back of the ankle (Achilles tendon) both before and after exercise.   Run or exercise on even surfaces that are not hard. For example, asphalt is better than pavement.   Do not run barefoot on hard surfaces.   If using a treadmill, vary the incline.   Do not continue to workout if you have foot or joint problems. Seek professional help if they do not improve.  HOME CARE INSTRUCTIONS  Avoid activities that cause you pain until you recover.   Use ice or cold packs on the problem or painful areas after working out.   Only take over-the-counter or prescription  medicines for pain, discomfort, or fever as directed by your caregiver.   Soft shoe inserts or athletic shoes with air or gel sole cushions may be helpful.   If problems continue or become more severe, consult a sports medicine caregiver or your own health care provider. Cortisone is a potent anti-inflammatory medication that may be injected into the painful area. You can discuss this treatment with your caregiver.  MAKE SURE YOU:  Understand these instructions.   Will watch your condition.   Will get help right away if you are not doing well or get worse.  Document Released: 01/25/2001 Document Re-Released: 07/27/2009 [facility] Patient Information 2011 [place], [place].

## 2010-10-17 ENCOUNTER — Encounter: Payer: Self-pay | Admitting: Internal Medicine

## 2010-10-17 NOTE — Assessment & Plan Note (Signed)
Xray was done to look for FB, osteomyelitis, bone deformity, or SQ air and all is neg/normal. I spoke to pt about the normal results and she wishes to see a hand surgeon to see if the tendons have been damaged.

## 2010-10-17 NOTE — Assessment & Plan Note (Signed)
There is no evidence of infection today

## 2010-10-17 NOTE — Progress Notes (Signed)
  Subjective:    Patient ID: Patty Stanley, female    DOB: 28-Aug-1980, 30 y.o.   MRN: [id number]  HPI She returns for f/up and she tells me that she was bitten by a cat three weeks ago and was treated at an [facility] and was given ? Antibiotics but she feels like the area is still painful around the site of the bite (the 2nd MCP joint). She has no redness or swelling and has good ROM in the index finger.    Review of Systems  Constitutional: Negative for fever, chills, diaphoresis, activity change, appetite change, fatigue and unexpected weight change.  HENT: Negative for facial swelling and neck pain.   Cardiovascular: Negative for chest pain, palpitations and leg swelling.  Gastrointestinal: Negative for nausea and diarrhea.  Musculoskeletal: Positive for arthralgias (at site of cat bite). Negative for myalgias, back pain, joint swelling and gait problem.  Skin: Negative for color change, pallor and rash.  Neurological: Negative for weakness and numbness.  Hematological: Negative for adenopathy. Does not bruise/bleed easily.  Psychiatric/Behavioral: Negative.        Objective:   Physical Exam  Vitals reviewed. Constitutional: She appears well-developed and well-nourished. No distress.  Eyes: Conjunctivae and EOM are normal. Pupils are equal, round, and reactive to light. Right eye exhibits no discharge. Left eye exhibits no discharge. No scleral icterus.  Neck: Normal range of motion. Neck supple. No JVD present. No tracheal deviation present. No thyromegaly present.  Cardiovascular: Normal rate, regular rhythm, normal heart sounds and intact distal pulses.  Exam reveals no gallop and no friction rub.   No murmur heard. Pulmonary/Chest: Effort normal and breath sounds normal. No respiratory distress. She has no wheezes. She has no rales. She exhibits no tenderness.  Abdominal: Soft. Bowel sounds are normal. She exhibits no distension and no mass. There is no tenderness. There is no rebound  and no guarding.  Musculoskeletal: Normal range of motion. She exhibits tenderness. She exhibits no edema.       Left hand: She exhibits bony tenderness. She exhibits normal range of motion, no tenderness, normal two-point discrimination, normal capillary refill, no deformity, no laceration and no swelling. normal sensation noted. Normal strength noted.       She has 3 tiny areas of scar tissue over the radial side of the 2nd MCP joint with minimal ttp but no palpable FB, erythema, warmth, induration, fluctuance, or streaking, In the finger she has excellent flexion and extension and good capillary refill.  Skin: She is not diaphoretic.          Assessment & Plan:

## 2010-11-01 ENCOUNTER — Ambulatory Visit (INDEPENDENT_AMBULATORY_CARE_PROVIDER_SITE_OTHER): Payer: Commercial Managed Care - PPO | Admitting: Internal Medicine

## 2010-11-01 ENCOUNTER — Encounter: Payer: Self-pay | Admitting: Internal Medicine

## 2010-11-01 VITALS — BP 110/90 | HR 107 | Temp 97.1°F | Ht 59.0 in | Wt 171.0 lb

## 2010-11-01 DIAGNOSIS — G43909 Migraine, unspecified, not intractable, without status migrainosus: Secondary | ICD-10-CM

## 2010-11-01 DIAGNOSIS — J45909 Unspecified asthma, uncomplicated: Secondary | ICD-10-CM

## 2010-11-01 MED ORDER — AZITHROMYCIN 250 MG PO TABS: 250.0000 mg | ORAL_TABLET | Freq: Every day | ORAL | Status: DC

## 2010-11-01 MED ORDER — ALBUTEROL 90 MCG/ACT IN AERS: 2.0000 | INHALATION_SPRAY | Freq: Four times a day (QID) | RESPIRATORY_TRACT | Status: DC | PRN

## 2010-11-01 MED ORDER — PROPRANOLOL HCL ER 80 MG PO CP24: 80.0000 mg | ORAL_CAPSULE | Freq: Every day | ORAL | Status: DC

## 2010-11-01 NOTE — Progress Notes (Signed)
  Subjective:    Patient ID: Patty Stanley, female    DOB: Jul 14, 1980, 30 y.o.   MRN: [id number]  HPI  complains of cough and congestion (head and chest) Onset 1 week ago Precipitated by sick contacts - spouse at home and ill coworkers +yellow sputum associated with chest tightness and wheeze, esp bedtime No fever but +chills No relief with otc meds  PMH reviewed - sig for asthma   Review of Systems  HENT: Positive for congestion, sneezing and postnasal drip. Negative for ear pain.   Respiratory: Negative for choking.   Cardiovascular: Negative for palpitations.       Objective:   Physical Exam BP 110/90  Pulse 107  Temp(Src) 97.1 F (36.2 C) (Oral)  Ht 4\' 11"  (1.499 m)  Wt 171 lb (77.565 kg)  BMI 34.54 kg/m2  SpO2 97% Physical Exam  Constitutional: oriented to person, place, and time. She appears well-developed and well-nourished. Mild resp congestion but no distress. spouse at side HENT: Head: Normocephalic and atraumatic. Ears; B TMs ok, no erythema or effusion; Nose: Nose normal.  Mouth/Throat: Oropharynx is clear and moist. No oropharyngeal exudate.  Neck: Normal range of motion. Neck supple. No JVD present. No thyromegaly present.  Cardiovascular: Normal rate, regular rhythm and normal heart sounds.  No murmur heard. No BLE edema. Pulmonary/Chest: Effort normal and breath sounds with bilateral rhonchi. No respiratory distress. She has no exp wheezes.  Psychiatric: She has a normal mood and affect. Her behavior is normal. Judgment and thought content normal.   Lab Results  Component Value Date   WBC 7.0 06/21/2010   HGB 13.4 06/21/2010   HCT 39.3 06/21/2010   PLT 325.0 06/21/2010   ALT 22 06/21/2010   AST 23 06/21/2010   NA 138 06/21/2010   K 4.3 06/21/2010   CL 104 06/21/2010   CREATININE 0.8 06/21/2010   BUN 4* 06/21/2010   CO2 27 06/21/2010   TSH 0.60 06/21/2010        Assessment & Plan:  Asthmatic bronchitis - no wheeze on exa at this time so hold steroids and use prn Alb  MDI - add empiric Zpak for infx and cont other otc symptomatic control as ongoing - call if symptoms worse or unimproved  Migraines - refill on prophylactic inderal provided per request (reports never has interfered with asthma symptoms )

## 2010-11-01 NOTE — Patient Instructions (Signed)
It was good to see you today. Zpack antibiotics and rescue inhaler - Your prescription(s) have been submitted to your pharmacy. Please take as directed and contact our office if you believe you are having problem(s) with the medication(s). Refill on medication(s) as discussed today.

## 2010-11-24 ENCOUNTER — Other Ambulatory Visit: Payer: Self-pay | Admitting: *Deleted

## 2010-11-24 MED ORDER — MONTELUKAST SODIUM 10 MG PO TABS: 10.0000 mg | ORAL_TABLET | Freq: Every day | ORAL | Status: DC

## 2010-11-26 ENCOUNTER — Other Ambulatory Visit: Payer: Self-pay | Admitting: Internal Medicine

## 2010-11-26 MED ORDER — MONTELUKAST SODIUM 10 MG PO TABS: 10.0000 mg | ORAL_TABLET | Freq: Every day | ORAL | Status: DC

## 2010-12-10 ENCOUNTER — Encounter: Payer: Self-pay | Admitting: Internal Medicine

## 2010-12-10 ENCOUNTER — Ambulatory Visit (INDEPENDENT_AMBULATORY_CARE_PROVIDER_SITE_OTHER): Payer: Commercial Managed Care - PPO | Admitting: Internal Medicine

## 2010-12-10 ENCOUNTER — Ambulatory Visit: Payer: Commercial Managed Care - PPO

## 2010-12-10 VITALS — BP 118/82 | HR 90 | Temp 98.0°F | Ht 59.0 in | Wt 176.0 lb

## 2010-12-10 DIAGNOSIS — F32A Depression, unspecified: Secondary | ICD-10-CM

## 2010-12-10 DIAGNOSIS — F411 Generalized anxiety disorder: Secondary | ICD-10-CM

## 2010-12-10 DIAGNOSIS — F329 Major depressive disorder, single episode, unspecified: Secondary | ICD-10-CM

## 2010-12-10 DIAGNOSIS — G2581 Restless legs syndrome: Secondary | ICD-10-CM

## 2010-12-10 DIAGNOSIS — F419 Anxiety disorder, unspecified: Secondary | ICD-10-CM

## 2010-12-10 LAB — CBC WITH DIFFERENTIAL/PLATELET
Basophils Relative: 0.7 % (ref 0.0–3.0)
Eosinophils Relative: 0.8 % (ref 0.0–5.0)
HCT: 36.1 % (ref 36.0–46.0)
Hemoglobin: 12.3 g/dL (ref 12.0–15.0)
Lymphs Abs: 2.3 10*3/uL (ref 0.7–4.0)
MCV: 86.8 fl (ref 78.0–100.0)
Monocytes Absolute: 0.7 10*3/uL (ref 0.1–1.0)
RBC: 4.16 Mil/uL (ref 3.87–5.11)
WBC: 10.4 10*3/uL (ref 4.5–10.5)

## 2010-12-10 LAB — IBC PANEL: Saturation Ratios: 9.6 % — ABNORMAL LOW (ref 20.0–50.0)

## 2010-12-10 MED ORDER — PROMETHAZINE HCL 25 MG PO TABS: 25.0000 mg | ORAL_TABLET | Freq: Four times a day (QID) | ORAL | Status: AC | PRN

## 2010-12-10 MED ORDER — ROPINIROLE HCL 0.5 MG PO TABS: 0.5000 mg | ORAL_TABLET | Freq: Every evening | ORAL | Status: DC | PRN

## 2010-12-10 NOTE — Patient Instructions (Signed)
It was good to see you today. Test(s) ordered today. Your results will be called to you after review (48-72hours after test completion). If any changes need to be made, you will be notified at that time. Requip for restless legs and take iron pill daily - Your prescription(s) have been submitted to your pharmacy. Please take as directed and contact our office if you believe you are having problem(s) with the medication(s). we'll make referral to counseling as discussed for anxiety and depression. Our office will contact you regarding appointment(s) once made.

## 2010-12-10 NOTE — Progress Notes (Signed)
  Subjective:    Patient ID: Patty Stanley, female    DOB: [birth year], 30 y.o.   MRN: [id number]  HPI  complains of "twitch" in legs - onset 2 weeks ago but "bad spell" last night Similar to prior RLS and prev tx with requip - but not on same for years Denies heavy menses or diet changes (hx low iron) Also increase nervousness  Anxiety precipitated by [place] want [place] to come back with my husband in November") No weakness, no syncope or dizziness  Past Medical History  Diagnosis Date  . Headache   . UNSPECIFIED TACHYCARDIA   . DEPRESSION   . ASTHMA   . OSTEOARTHRITIS   . ALLERGIC RHINITIS     Review of Systems  Respiratory: Negative for shortness of breath.   Cardiovascular: Negative for chest pain.  Gastrointestinal: Negative for abdominal pain.  Psychiatric/Behavioral: Positive for dysphoric mood. Negative for suicidal ideas and sleep disturbance. The patient is nervous/anxious.        Objective:   Physical Exam BP 118/82  Pulse 90  Temp(Src) 98 F (36.7 C) (Oral)  Ht 4\' 11"  (1.499 m)  Wt 176 lb (79.833 kg)  BMI 35.55 kg/m2  SpO2 97%  LMP 11/29/2010  Constitutional: She is oriented to person, place, and time. She appears well-developed and well-nourished. Tremulous, visibly shudders but nontoxic.  Cardiovascular: Normal rate, regular rhythm and normal heart sounds.  No murmur heard. No BLE edema. Pulmonary/Chest: Effort normal and breath sounds normal. No respiratory distress. She has no wheezes.  Abdominal: Soft. Bowel sounds are normal. She exhibits no distension. There is no tenderness.  Neurological: She is alert and oriented to person, place, and time. No cranial nerve deficit. Coordination normal.  Psychiatric: She has an anxious mood and affect. Tremulous when discussing her birth parents and stress with spouse. Judgment and thought content fair  Lab Results  Component Value Date   WBC 7.0 06/21/2010   HGB 13.4 06/21/2010   HCT 39.3 06/21/2010   PLT 325.0 06/21/2010   ALT 22 06/21/2010   AST 23 06/21/2010   NA 138 06/21/2010   K 4.3 06/21/2010   CL 104 06/21/2010   CREATININE 0.8 06/21/2010   BUN 4* 06/21/2010   CO2 27 06/21/2010   TSH 0.60 06/21/2010       Assessment & Plan:   Twitch in legs - hx RLS but also exac by stress - check TSH, CBC and iron now - resume requip (prior good relief with same); iron qd otc  Depression/anxiety - long hx same - not currently in counseling - symptoms exac by family stressors (visiting [place] birth parents and plans to do same in fall) - no change in meds but pt agrees to resume counseling - refer to behav health done  Nausea - no vomiting - suspect related to increasing anxiety - GI exam and VS benign - refill prometh

## 2010-12-27 ENCOUNTER — Ambulatory Visit: Payer: Commercial Managed Care - PPO | Admitting: Licensed Clinical Social Worker

## 2011-01-03 ENCOUNTER — Ambulatory Visit: Payer: Commercial Managed Care - PPO | Admitting: Licensed Clinical Social Worker

## 2011-01-13 ENCOUNTER — Ambulatory Visit (INDEPENDENT_AMBULATORY_CARE_PROVIDER_SITE_OTHER): Payer: Commercial Managed Care - PPO | Admitting: Professional

## 2011-01-13 DIAGNOSIS — F331 Major depressive disorder, recurrent, moderate: Secondary | ICD-10-CM

## 2011-01-20 ENCOUNTER — Ambulatory Visit (INDEPENDENT_AMBULATORY_CARE_PROVIDER_SITE_OTHER): Payer: Commercial Managed Care - PPO | Admitting: Professional

## 2011-01-20 DIAGNOSIS — F331 Major depressive disorder, recurrent, moderate: Secondary | ICD-10-CM

## 2011-01-27 ENCOUNTER — Encounter: Payer: Self-pay | Admitting: Internal Medicine

## 2011-01-27 ENCOUNTER — Ambulatory Visit (INDEPENDENT_AMBULATORY_CARE_PROVIDER_SITE_OTHER): Payer: Commercial Managed Care - PPO | Admitting: Internal Medicine

## 2011-01-27 ENCOUNTER — Ambulatory Visit (INDEPENDENT_AMBULATORY_CARE_PROVIDER_SITE_OTHER)
Admission: RE | Admit: 2011-01-27 | Discharge: 2011-01-27 | Disposition: A | Discharge status: Home / Self Care | Payer: Commercial Managed Care - PPO | Source: Ambulatory Visit | Attending: Internal Medicine | Admitting: Internal Medicine

## 2011-01-27 ENCOUNTER — Ambulatory Visit (INDEPENDENT_AMBULATORY_CARE_PROVIDER_SITE_OTHER): Payer: Commercial Managed Care - PPO | Admitting: Professional

## 2011-01-27 VITALS — BP 126/80 | HR 85 | Temp 98.8°F | Resp 16 | Wt 173.0 lb

## 2011-01-27 DIAGNOSIS — H9190 Unspecified hearing loss, unspecified ear: Secondary | ICD-10-CM | POA: Insufficient documentation

## 2011-01-27 DIAGNOSIS — M545 Low back pain, unspecified: Secondary | ICD-10-CM | POA: Insufficient documentation

## 2011-01-27 DIAGNOSIS — F331 Major depressive disorder, recurrent, moderate: Secondary | ICD-10-CM

## 2011-01-27 MED ORDER — METHOCARBAMOL 500 MG PO TABS: 500.0000 mg | ORAL_TABLET | Freq: Four times a day (QID) | ORAL | Status: AC

## 2011-01-27 MED ORDER — NAPROXEN 375 MG PO TABS: 375.0000 mg | ORAL_TABLET | Freq: Two times a day (BID) | ORAL | Status: DC

## 2011-01-27 NOTE — Progress Notes (Signed)
Subjective:    Patient ID: Patty Stanley, female    DOB: 1980/08/05, 30 y.o.   MRN: [id number]  Back Pain This is a recurrent problem. The current episode started 1 to 4 weeks ago. The problem occurs constantly. The problem is unchanged. The pain is present in the lumbar spine. The quality of the pain is described as aching and stabbing. The pain does not radiate. The pain is at a severity of 4/10. The pain is moderate. The pain is worse during the day. The symptoms are aggravated by bending. Stiffness is present all day. Pertinent negatives include no abdominal pain, bladder incontinence, bowel incontinence, chest pain, dysuria, fever, headaches, leg pain, numbness, paresis, paresthesias, pelvic pain, perianal numbness, tingling, weakness or weight loss. Risk factors include lack of exercise, obesity and poor posture. She has tried analgesics (tylenol) for the symptoms. The treatment provided mild relief.      Review of Systems  Constitutional: Negative for fever, chills, weight loss, diaphoresis, activity change, appetite change, fatigue and unexpected weight change.  HENT: Positive for hearing loss (for 3 years). Negative for ear pain, nosebleeds, congestion, sore throat, facial swelling, rhinorrhea, sneezing, drooling, mouth sores, trouble swallowing, neck pain, neck stiffness, dental problem, voice change, postnasal drip, sinus pressure, tinnitus and ear discharge.   Eyes: Negative.   Respiratory: Negative.   Cardiovascular: Negative.  Negative for chest pain.  Gastrointestinal: Negative.  Negative for abdominal pain and bowel incontinence.  Genitourinary: Negative.  Negative for bladder incontinence, dysuria and pelvic pain.  Musculoskeletal: Positive for back pain. Negative for myalgias, joint swelling, arthralgias and gait problem.  Skin: Negative.   Neurological: Negative for dizziness, tingling, tremors, seizures, syncope, facial asymmetry, speech difficulty, weakness, light-headedness,  numbness, headaches and paresthesias.  Hematological: Negative for adenopathy. Does not bruise/bleed easily.  Psychiatric/Behavioral: Negative.        Objective:   Physical Exam  Vitals reviewed. Constitutional: She appears well-developed and well-nourished. No distress.  HENT:  Mouth/Throat: Oropharynx is clear and moist. No oropharyngeal exudate.  Eyes: Conjunctivae are normal. Right eye exhibits no discharge. Left eye exhibits no discharge. No scleral icterus.  Neck: Normal range of motion. Neck supple. No JVD present. No tracheal deviation present. No thyromegaly present.  Cardiovascular: Normal rate, regular rhythm, normal heart sounds and intact distal pulses.  Exam reveals no gallop and no friction rub.   No murmur heard. Pulmonary/Chest: Effort normal and breath sounds normal. No stridor. No respiratory distress. She has no wheezes. She has no rales. She exhibits no tenderness.  Abdominal: Soft. Bowel sounds are normal. She exhibits no distension and no mass. There is no tenderness. There is no rebound and no guarding.  Musculoskeletal: Normal range of motion. She exhibits no edema and no tenderness.       Lumbar back: She exhibits deformity and spasm. She exhibits normal range of motion, no tenderness, no bony tenderness, no swelling, no edema, no laceration, no pain and normal pulse.       Back:       There is a 2 cm ecchymosis over the right PSIC but it is not ttp or swollen  Lymphadenopathy:    She has no cervical adenopathy.  Neurological: She is alert. She has normal strength. She displays no atrophy, no tremor and normal reflexes. No cranial nerve deficit or sensory deficit. She exhibits normal muscle tone. She displays no seizure activity. Coordination and gait normal.  Reflex Scores:      Tricep reflexes are 1+ on the right side  and 1+ on the left side.      Bicep reflexes are 1+ on the right side and 1+ on the left side.      Brachioradialis reflexes are 1+ on the right  side and 1+ on the left side.      Patellar reflexes are 1+ on the right side and 1+ on the left side.      Achilles reflexes are 1+ on the right side and 1+ on the left side. Skin: Skin is warm and dry. No rash noted. She is not diaphoretic. No erythema. No pallor.  Psychiatric: She has a normal mood and affect. Her behavior is normal. Judgment and thought content normal.          Assessment & Plan:

## 2011-01-27 NOTE — Assessment & Plan Note (Signed)
Audiology referral.

## 2011-01-27 NOTE — Assessment & Plan Note (Addendum)
I will check plain film today to look for fracture, etc and will start nsaids and a muscle relaxer

## 2011-01-27 NOTE — Patient Instructions (Signed)
Back Pain (Lumbosacral Strain) Back pain is one of the most common causes of pain. There are many causes of back pain. Most are not serious conditions.  CAUSES Your backbone (spinal column) is made up of 24 main vertebral bodies, the sacrum, and the coccyx. These are held together by muscles and tough, fibrous tissue (ligaments). Nerve roots pass through the openings between the vertebrae. A sudden move or injury to the back may cause injury to, or pressure on, these nerves. This may result in localized back pain or pain movement (radiation) into the buttocks, down the leg, and into the foot. Sharp, shooting pain from the buttock down the back of the leg (sciatica) is frequently associated with a ruptured (herniated) disc. Pain may be caused by muscle spasm alone. Your caregiver can often find the cause of your pain by the details of your symptoms and an exam. In some cases, you may need tests (such as X-rays). Your caregiver will work with you to decide if any tests are needed based on your specific exam. HOME CARE INSTRUCTIONS  Avoid an underactive lifestyle. Active exercise, as directed by your caregiver, is your greatest weapon against back pain.   Avoid hard physical activities (tennis, racquetball, water-skiing) if you are not in proper physical condition for it. This may aggravate and/or create problems.   If you have a back problem, avoid sports requiring sudden body movements. Swimming and walking are generally safer activities.   Maintain good posture.   Avoid becoming overweight (obese).   Use bed rest for only the most extreme, sudden (acute) episode. Your caregiver will help you determine how much bed rest is necessary.   For acute conditions, you may put ice on the injured area.   Put ice in a plastic bag.   Place a towel between your skin and the bag.   Leave the ice on for 20 minutes at a time, every 2 hours, or as needed.   After you are improved and more active, it may  help to apply heat for 30 minutes before activities.  See your caregiver if you are having pain that lasts longer than expected. Your caregiver can advise appropriate exercises and/or therapy if needed. With conditioning, most back problems can be avoided. SEEK IMMEDIATE MEDICAL CARE IF:  You have numbness, tingling, weakness, or problems with the use of your arms or legs.   You experience severe back pain not relieved with medicines.   There is a change in bowel or bladder control.   You have increasing pain in any area of the body, including your belly (abdomen).   You notice shortness of breath, dizziness, or feel faint.   You feel sick to your stomach (nauseous), are throwing up (vomiting), or become sweaty.   You notice discoloration of your toes or legs, or your feet get very cold.   Your back pain is getting worse.   You have an oral temperature above 100.5, not controlled by medicine.  MAKE SURE YOU:   Understand these instructions.   Will watch your condition.   Will get help right away if you are not doing well or get worse.  Document Released: 02/09/2005 Document Re-Released: 07/27/2009 [employer] Patient Information 2011 [employer]. 

## 2011-02-10 ENCOUNTER — Encounter: Payer: Self-pay | Admitting: Internal Medicine

## 2011-02-10 ENCOUNTER — Ambulatory Visit (INDEPENDENT_AMBULATORY_CARE_PROVIDER_SITE_OTHER): Payer: Commercial Managed Care - PPO | Admitting: Internal Medicine

## 2011-02-10 ENCOUNTER — Ambulatory Visit (INDEPENDENT_AMBULATORY_CARE_PROVIDER_SITE_OTHER): Payer: Commercial Managed Care - PPO | Admitting: Professional

## 2011-02-10 VITALS — BP 110/90 | HR 84 | Temp 98.4°F | Ht 59.0 in

## 2011-02-10 DIAGNOSIS — J45909 Unspecified asthma, uncomplicated: Secondary | ICD-10-CM

## 2011-02-10 DIAGNOSIS — F331 Major depressive disorder, recurrent, moderate: Secondary | ICD-10-CM

## 2011-02-10 MED ORDER — METHYLPREDNISOLONE ACETATE 80 MG/ML IJ SUSP
120.0000 mg | Freq: Once | INTRAMUSCULAR | Status: AC
  Administered 2011-02-10: 120 mg via INTRAMUSCULAR

## 2011-02-10 MED ORDER — HYDROCODONE-HOMATROPINE 5-1.5 MG/5ML PO SYRP: 5.0000 mL | ORAL_SOLUTION | Freq: Four times a day (QID) | ORAL | Status: DC | PRN

## 2011-02-10 MED ORDER — AZITHROMYCIN 250 MG PO TABS: 250.0000 mg | ORAL_TABLET | Freq: Every day | ORAL | Status: AC

## 2011-02-10 NOTE — Patient Instructions (Signed)
It was good to see you today. Steroid shot given today Zpack antibiotics and cough syrup - Your prescription(s) have been submitted to your pharmacy. Please take as directed and contact our office if you believe you are having problem(s) with the medication(s). continue other asthma and allergy medications as ongoing

## 2011-02-10 NOTE — Progress Notes (Signed)
  Subjective:    Patient ID: Patty Stanley, female    DOB: 11-13-1980, 30 y.o.   MRN: [id number]  Cough Associated symptoms include postnasal drip. Pertinent negatives include no ear pain.   complains of cough and congestion (chest>head) Onset 1 week ago Denies sick contacts - +yellow sputum, scant associated with chest tightness and wheeze, esp bedtime No fever but +chills No relief with otc meds  Past Medical History  Diagnosis Date  . Headache   . UNSPECIFIED TACHYCARDIA   . DEPRESSION   . ASTHMA   . OSTEOARTHRITIS   . ALLERGIC RHINITIS     Review of Systems  HENT: Positive for congestion, sneezing and postnasal drip. Negative for ear pain.   Respiratory: Positive for cough. Negative for choking.   Cardiovascular: Negative for palpitations.       Objective:   Physical Exam  BP 110/90  Pulse 84  Temp(Src) 98.4 F (36.9 C) (Oral)  Ht 4\' 11"  (1.499 m)  SpO2 98%  LMP 01/21/2011   Constitutional: She appears well-developed and well-nourished. Audible resp congestion with deep cough but no distress. HENT: Head: Normocephalic and atraumatic. Ears; B TMs ok, no erythema or effusion; Nose: Nose normal.  Mouth/Throat: Oropharynx is clear and moist. No oropharyngeal exudate.  Neck: Normal range of motion. Neck supple. No JVD present. No thyromegaly present.  Cardiovascular: Normal rate, regular rhythm and normal heart sounds.  No murmur heard. No BLE edema. Pulmonary/Chest: Effort normal and breath sounds with bilateral rhonchi and exp wheeze. No respiratory distress.  Psychiatric: She has a normal mood and affect. Her behavior is normal. Judgment and thought content normal.   Lab Results  Component Value Date   WBC 10.4 12/10/2010   HGB 12.3 12/10/2010   HCT 36.1 12/10/2010   PLT 304.0 12/10/2010   ALT 22 06/21/2010   AST 23 06/21/2010   NA 138 06/21/2010   K 4.3 06/21/2010   CL 104 06/21/2010   CREATININE 0.8 06/21/2010   BUN 4* 06/21/2010   CO2 27 06/21/2010   TSH 2.23 12/10/2010         Assessment & Plan:  Asthmatic bronchitis  + wheeze on exam so IM steroids today; use prn Alb MDI and Zpak for infx - Hycodan for symptomatic control - call if symptoms worse or unimproved

## 2011-02-15 ENCOUNTER — Encounter: Payer: Self-pay | Admitting: Internal Medicine

## 2011-02-17 ENCOUNTER — Ambulatory Visit (INDEPENDENT_AMBULATORY_CARE_PROVIDER_SITE_OTHER): Payer: Commercial Managed Care - PPO | Admitting: Professional

## 2011-02-17 ENCOUNTER — Telehealth: Payer: Self-pay | Admitting: *Deleted

## 2011-02-17 DIAGNOSIS — J45909 Unspecified asthma, uncomplicated: Secondary | ICD-10-CM

## 2011-02-17 DIAGNOSIS — F331 Major depressive disorder, recurrent, moderate: Secondary | ICD-10-CM

## 2011-02-17 DIAGNOSIS — M545 Low back pain: Secondary | ICD-10-CM

## 2011-02-17 MED ORDER — METHYLPREDNISOLONE 4 MG PO KIT: PACK | ORAL | Status: AC

## 2011-02-17 MED ORDER — HYDROCODONE-HOMATROPINE 5-1.5 MG/5ML PO SYRP: 5.0000 mL | ORAL_SOLUTION | Freq: Four times a day (QID) | ORAL | Status: DC | PRN

## 2011-02-17 NOTE — Telephone Encounter (Signed)
Called in RX's to [facility] [address], left VM for pt to check w/her pharm

## 2011-02-17 NOTE — Telephone Encounter (Signed)
Pt continues to c/o non productive persistent cough. She is req Music therapist

## 2011-02-17 NOTE — Telephone Encounter (Signed)
Refill hycodan syrup and pred pak - (please phone in) - If continued cough, will need ROV (PCP)

## 2011-02-24 ENCOUNTER — Encounter: Payer: Self-pay | Admitting: Internal Medicine

## 2011-02-24 ENCOUNTER — Ambulatory Visit (INDEPENDENT_AMBULATORY_CARE_PROVIDER_SITE_OTHER)
Admission: RE | Admit: 2011-02-24 | Discharge: 2011-02-24 | Disposition: A | Discharge status: Home / Self Care | Payer: Commercial Managed Care - PPO | Source: Ambulatory Visit | Attending: Internal Medicine | Admitting: Internal Medicine

## 2011-02-24 ENCOUNTER — Ambulatory Visit (INDEPENDENT_AMBULATORY_CARE_PROVIDER_SITE_OTHER): Payer: Commercial Managed Care - PPO | Admitting: Internal Medicine

## 2011-02-24 ENCOUNTER — Ambulatory Visit: Payer: Commercial Managed Care - PPO | Admitting: Professional

## 2011-02-24 DIAGNOSIS — R059 Cough, unspecified: Secondary | ICD-10-CM

## 2011-02-24 DIAGNOSIS — R05 Cough: Secondary | ICD-10-CM

## 2011-02-24 DIAGNOSIS — J209 Acute bronchitis, unspecified: Secondary | ICD-10-CM

## 2011-02-24 DIAGNOSIS — J45909 Unspecified asthma, uncomplicated: Secondary | ICD-10-CM

## 2011-02-24 MED ORDER — HYDROCOD POLST-CPM POLST ER 10-8 MG PO CP12: 1.0000 | ORAL_CAPSULE | Freq: Two times a day (BID) | ORAL | Status: DC | PRN

## 2011-02-24 MED ORDER — BECLOMETHASONE DIPROPIONATE 80 MCG/ACT IN AERS: 1.0000 | INHALATION_SPRAY | Freq: Two times a day (BID) | RESPIRATORY_TRACT | Status: DC

## 2011-02-24 MED ORDER — MOXIFLOXACIN HCL 400 MG PO TABS: 400.0000 mg | ORAL_TABLET | Freq: Every day | ORAL | Status: AC

## 2011-02-24 NOTE — Assessment & Plan Note (Signed)
I will check her CXR for PNA

## 2011-02-24 NOTE — Assessment & Plan Note (Signed)
I will start avelox and offer tussicaps for the cough

## 2011-02-24 NOTE — Progress Notes (Signed)
  Subjective:    Patient ID: Patty Stanley, female    DOB: May 25, 1980, 30 y.o.   MRN: [id number]  Cough This is a recurrent problem. The current episode started 1 to 4 weeks ago. The problem has been gradually worsening. The problem occurs every few hours. The cough is productive of purulent sputum. Associated symptoms include chills, a fever, a sore throat and shortness of breath. Pertinent negatives include no chest pain, ear congestion, ear pain, headaches, heartburn, hemoptysis, myalgias, nasal congestion, postnasal drip, rash, rhinorrhea, sweats, weight loss or wheezing. The symptoms are aggravated by cold air. She has tried prescription cough suppressant for the symptoms. The treatment provided no relief. Her past medical history is significant for asthma.      Review of Systems  Constitutional: Positive for fever and chills. Negative for weight loss, diaphoresis, activity change, appetite change, fatigue and unexpected weight change.  HENT: Positive for sore throat. Negative for hearing loss, ear pain, nosebleeds, congestion, facial swelling, rhinorrhea, sneezing, drooling, mouth sores, trouble swallowing, neck pain, neck stiffness, dental problem, voice change, postnasal drip, sinus pressure, tinnitus and ear discharge.   Eyes: Negative.   Respiratory: Positive for cough and shortness of breath. Negative for apnea, hemoptysis, choking, chest tightness, wheezing and stridor.   Cardiovascular: Negative for chest pain, palpitations and leg swelling.  Gastrointestinal: Negative for heartburn, nausea, vomiting, abdominal pain, diarrhea, constipation, blood in stool, abdominal distention, anal bleeding and rectal pain.  Genitourinary: Negative for dysuria, urgency, frequency, hematuria, flank pain, decreased urine volume, enuresis, difficulty urinating and dyspareunia.  Musculoskeletal: Negative for myalgias, back pain, joint swelling, arthralgias and gait problem.  Skin: Negative for color  change, pallor, rash and wound.  Neurological: Negative for dizziness, tremors, seizures, syncope, facial asymmetry, speech difficulty, weakness, light-headedness, numbness and headaches.  Hematological: Negative for adenopathy. Does not bruise/bleed easily.  Psychiatric/Behavioral: Negative.        Objective:   Physical Exam  Vitals reviewed. Constitutional: She is oriented to person, place, and time. She appears well-developed and well-nourished. No distress.  HENT:  Head: Normocephalic and atraumatic.  Mouth/Throat: Oropharynx is clear and moist. No oropharyngeal exudate.  Eyes: Conjunctivae are normal. Right eye exhibits no discharge. Left eye exhibits no discharge. No scleral icterus.  Neck: Normal range of motion. Neck supple. No JVD present. No tracheal deviation present. No thyromegaly present.  Cardiovascular: Normal rate, regular rhythm, normal heart sounds and intact distal pulses.  Exam reveals no gallop and no friction rub.   No murmur heard. Pulmonary/Chest: Effort normal and breath sounds normal. No accessory muscle usage or stridor. Not tachypneic. No respiratory distress. She has no decreased breath sounds. She has no wheezes. She has no rhonchi. She has no rales. She exhibits no tenderness.  Abdominal: Soft. Bowel sounds are normal. She exhibits no distension and no mass. There is no tenderness. There is no rebound and no guarding.  Musculoskeletal: Normal range of motion. She exhibits no edema and no tenderness.  Lymphadenopathy:    She has no cervical adenopathy.  Neurological: She is oriented to person, place, and time. She displays normal reflexes. She exhibits normal muscle tone. Coordination normal.  Skin: Skin is warm and dry. No rash noted. She is not diaphoretic. No erythema. No pallor.  Psychiatric: She has a normal mood and affect. Her behavior is normal. Judgment and thought content normal.          Assessment & Plan:

## 2011-02-24 NOTE — Patient Instructions (Signed)
Bronchitis Bronchitis is the body's way of reacting to injury and/or infection (inflammation) of the bronchi. Bronchi are the air tubes that extend from the windpipe into the lungs. If the inflammation becomes severe, it may cause shortness of breath.  CAUSES Inflammation may be caused by:  A virus.   Germs (bacteria).   Dust.   Allergens.   Pollutants and many other irritants.  The cells lining the bronchial tree are covered with tiny hairs (cilia). These constantly beat upward, away from the lungs, toward the mouth. This keeps the lungs free of pollutants. When these cells become too irritated and are unable to do their job, mucus begins to develop. This causes the characteristic cough of bronchitis. The cough clears the lungs when the cilia are unable to do their job. Without either of these protective mechanisms, the mucus would settle in the lungs. Then you would develop pneumonia. Smoking is a common cause of bronchitis and can contribute to pneumonia. Stopping this habit is the single most important thing you can do to help yourself. TREATMENT  Your caregiver may prescribe an antibiotic if the cough is caused by bacteria. Also, medicines that open up your airways make it easier to breathe. Your caregiver may also recommend or prescribe an expectorant. It will loosen the mucus to be coughed up. Only take over-the-counter or prescription medicines for pain, discomfort, or fever as directed by your caregiver.   Removing whatever causes the problem (smoking, for example) is critical to preventing the problem from getting worse.   Cough suppressants may be prescribed for relief of cough symptoms.   Inhaled medicines may be prescribed to help with symptoms now and to help prevent problems from returning.   For those with recurrent (chronic) bronchitis, there may be a need for steroid medicines.  SEEK IMMEDIATE MEDICAL CARE IF:  During treatment, you develop more pus-like mucus  (purulent sputum).   You or your child has an oral temperature above 100.5, not controlled by medicine.   Your baby is older than 3 months with a rectal temperature of 102 F (38.9 C) or higher.   Your baby is 3 months old or younger with a rectal temperature of 100.4 F (38 C) or higher.   You become progressively more ill.   You have increased difficulty breathing, wheezing, or shortness of breath.  It is necessary to seek immediate medical care if you are elderly or sick from any other disease. MAKE SURE YOU:  Understand these instructions.   Will watch your condition.   Will get help right away if you are not doing well or get worse.  Document Released: 05/02/2005 Document Re-Released: 07/27/2009 [employer] Patient Information 2011 [employer]. 

## 2011-02-24 NOTE — Assessment & Plan Note (Signed)
Start qvar BID

## 2011-03-10 ENCOUNTER — Ambulatory Visit: Payer: Commercial Managed Care - PPO | Admitting: Professional

## 2011-03-17 ENCOUNTER — Ambulatory Visit: Payer: Commercial Managed Care - PPO | Admitting: Professional

## 2011-03-24 ENCOUNTER — Ambulatory Visit: Payer: Commercial Managed Care - PPO | Admitting: Professional

## 2011-04-13 ENCOUNTER — Other Ambulatory Visit: Payer: Self-pay | Admitting: Internal Medicine

## 2011-04-14 ENCOUNTER — Ambulatory Visit: Payer: Commercial Managed Care - PPO | Admitting: Professional

## 2011-04-29 ENCOUNTER — Encounter: Payer: Self-pay | Admitting: Internal Medicine

## 2011-04-29 ENCOUNTER — Other Ambulatory Visit (INDEPENDENT_AMBULATORY_CARE_PROVIDER_SITE_OTHER): Payer: Commercial Managed Care - PPO

## 2011-04-29 ENCOUNTER — Ambulatory Visit (INDEPENDENT_AMBULATORY_CARE_PROVIDER_SITE_OTHER): Payer: Commercial Managed Care - PPO | Admitting: Internal Medicine

## 2011-04-29 ENCOUNTER — Other Ambulatory Visit: Payer: Self-pay | Admitting: Internal Medicine

## 2011-04-29 VITALS — BP 110/72 | HR 85 | Temp 97.9°F | Resp 16 | Wt 167.0 lb

## 2011-04-29 DIAGNOSIS — R112 Nausea with vomiting, unspecified: Secondary | ICD-10-CM

## 2011-04-29 DIAGNOSIS — Z23 Encounter for immunization: Secondary | ICD-10-CM

## 2011-04-29 LAB — COMPREHENSIVE METABOLIC PANEL
ALT: 37 U/L — ABNORMAL HIGH (ref 0–35)
CO2: 29 mEq/L (ref 19–32)
Calcium: 8.9 mg/dL (ref 8.4–10.5)
Chloride: 108 mEq/L (ref 96–112)
Creatinine, Ser: 0.9 mg/dL (ref 0.4–1.2)
GFR: 81.29 mL/min (ref >60.00)
Glucose, Bld: 91 mg/dL (ref 70–99)
Sodium: 143 mEq/L (ref 135–145)
Total Protein: 6.7 g/dL (ref 6.0–8.3)

## 2011-04-29 LAB — CBC WITH DIFFERENTIAL/PLATELET
Basophils Absolute: 0 10*3/uL (ref 0.0–0.1)
Eosinophils Relative: 2.4 % (ref 0.0–5.0)
HCT: 38.4 % (ref 36.0–46.0)
Hemoglobin: 12.8 g/dL (ref 12.0–15.0)
Lymphocytes Relative: 40.2 % (ref 12.0–46.0)
Lymphs Abs: 3.3 10*3/uL (ref 0.7–4.0)
Monocytes Relative: 8 % (ref 3.0–12.0)
Neutro Abs: 4 10*3/uL (ref 1.4–7.7)
RDW: 12.5 % (ref 11.5–14.6)
WBC: 8.2 10*3/uL (ref 4.5–10.5)

## 2011-04-29 LAB — URINALYSIS, ROUTINE W REFLEX MICROSCOPIC
Hgb urine dipstick: NEGATIVE
Nitrite: NEGATIVE
Total Protein, Urine: NEGATIVE

## 2011-04-29 LAB — AMYLASE: Amylase: 68 U/L (ref 27–131)

## 2011-04-29 MED ORDER — ONDANSETRON HCL 4 MG PO TABS: 4.0000 mg | ORAL_TABLET | Freq: Every day | ORAL | Status: DC | PRN

## 2011-04-29 NOTE — Progress Notes (Signed)
  Subjective:    Patient ID: Patty Stanley, female    DOB: 1980/09/21, 30 y.o.   MRN: [id number]  HPI She returns and tells me that she has had intermittent N/V for two weeks, associated with the death of her GM and a trip to [place] where she ate poorly prepared [place] that did not taste "right." The vomitus is yellow food stuff and she noticed black stools after she started using pepto-bismol.   Review of Systems  Constitutional: Negative for fever, chills, diaphoresis, activity change, appetite change, fatigue and unexpected weight change.  HENT: Negative.   Eyes: Negative.   Respiratory: Negative for cough, chest tightness, shortness of breath, wheezing and stridor.   Cardiovascular: Negative for chest pain, palpitations and leg swelling.  Gastrointestinal: Positive for nausea and vomiting. Negative for abdominal pain, diarrhea, constipation, blood in stool, abdominal distention, anal bleeding and rectal pain.  Genitourinary: Negative for dysuria, urgency, frequency, hematuria, flank pain, decreased urine volume, vaginal bleeding, vaginal discharge, enuresis, difficulty urinating, genital sores, vaginal pain, menstrual problem, pelvic pain and dyspareunia.  Musculoskeletal: Negative for myalgias, back pain, joint swelling, arthralgias and gait problem.  Skin: Negative for color change, pallor, rash and wound.  Neurological: Negative for dizziness, tremors, seizures, syncope, facial asymmetry, speech difficulty, weakness, light-headedness, numbness and headaches.  Hematological: Negative for adenopathy. Does not bruise/bleed easily.  Psychiatric/Behavioral: Negative.        Objective:   Physical Exam  Vitals reviewed. Constitutional: She is oriented to person, place, and time. She appears well-developed and well-nourished. No distress.  HENT:  Head: Normocephalic and atraumatic.  Mouth/Throat: Oropharynx is clear and moist. No oropharyngeal exudate.  Eyes: Conjunctivae are normal. Right  eye exhibits no discharge. Left eye exhibits no discharge. No scleral icterus.  Neck: Normal range of motion. Neck supple. No JVD present. No tracheal deviation present. No thyromegaly present.  Cardiovascular: Normal rate, regular rhythm, normal heart sounds and intact distal pulses.  Exam reveals no gallop and no friction rub.   No murmur heard. Pulmonary/Chest: Effort normal and breath sounds normal. No stridor. No respiratory distress. She has no wheezes. She has no rales. She exhibits no tenderness.  Abdominal: Soft. Bowel sounds are normal. She exhibits no distension and no mass. There is no tenderness. There is no rebound and no guarding.  Genitourinary: Rectum normal. Rectal exam shows no external hemorrhoid, no internal hemorrhoid, no fissure, no mass, no tenderness and anal tone normal. Guaiac negative stool.  Musculoskeletal: Normal range of motion. She exhibits no edema and no tenderness.  Lymphadenopathy:    She has no cervical adenopathy.  Neurological: She is oriented to person, place, and time.  Skin: Skin is warm and dry. No rash noted. She is not diaphoretic. No erythema. No pallor.  Psychiatric: She has a normal mood and affect. Her behavior is normal. Judgment and thought content normal.      Lab Results  Component Value Date   WBC 10.4 12/10/2010   HGB 12.3 12/10/2010   HCT 36.1 12/10/2010   PLT 304.0 12/10/2010   GLUCOSE 97 06/21/2010   ALT 22 06/21/2010   AST 23 06/21/2010   NA 138 06/21/2010   K 4.3 06/21/2010   CL 104 06/21/2010   CREATININE 0.8 06/21/2010   BUN 4* 06/21/2010   CO2 27 06/21/2010   TSH 2.23 12/10/2010      Assessment & Plan:

## 2011-04-29 NOTE — Patient Instructions (Signed)
Nausea and Vomiting Nausea is a sick feeling that often comes before throwing up (vomiting). Vomiting is a reflex where stomach contents come out of your mouth. Vomiting can cause severe loss of body fluids (dehydration). Children and elderly adults can become dehydrated quickly, especially if they also have diarrhea. Nausea and vomiting are symptoms of a condition or disease. It is important to find the cause of your symptoms. CAUSES   Direct irritation of the stomach lining. This irritation can result from increased acid production (gastroesophageal reflux disease), infection, food poisoning, taking certain medicines (such as nonsteroidal anti-inflammatory drugs), alcohol use, or tobacco use.   Signals from the brain.These signals could be caused by a headache, heat exposure, an inner ear disturbance, increased pressure in the brain from injury, infection, a tumor, or a concussion, pain, emotional stimulus, or metabolic problems.   An obstruction in the gastrointestinal tract (bowel obstruction).   Illnesses such as diabetes, hepatitis, gallbladder problems, appendicitis, kidney problems, cancer, sepsis, atypical symptoms of a heart attack, or eating disorders.   Medical treatments such as chemotherapy and radiation.   Receiving medicine that makes you sleep (general anesthetic) during surgery.  DIAGNOSIS Your caregiver may ask for tests to be done if the problems do not improve after a few days. Tests may also be done if symptoms are severe or if the reason for the nausea and vomiting is not clear. Tests may include:  Urine tests.   Blood tests.   Stool tests.   Cultures (to look for evidence of infection).   X-rays or other imaging studies.  Test results can help your caregiver make decisions about treatment or the need for additional tests. TREATMENT You need to stay well hydrated. Drink frequently but in small amounts.You may wish to drink water, sports drinks, clear broth, or  eat frozen ice pops or gelatin dessert to help stay hydrated.When you eat, eating slowly may help prevent nausea.There are also some antinausea medicines that may help prevent nausea. HOME CARE INSTRUCTIONS   Take all medicine as directed by your caregiver.   If you do not have an appetite, do not force yourself to eat. However, you must continue to drink fluids.   If you have an appetite, eat a normal diet unless your caregiver tells you differently.   Eat a variety of complex carbohydrates (rice, wheat, potatoes, bread), lean meats, yogurt, fruits, and vegetables.   Avoid high-fat foods because they are more difficult to digest.   Drink enough water and fluids to keep your urine clear or pale yellow.   If you are dehydrated, ask your caregiver for specific rehydration instructions. Signs of dehydration may include:   Severe thirst.   Dry lips and mouth.   Dizziness.   Dark urine.   Decreasing urine frequency and amount.   Confusion.   Rapid breathing or pulse.  SEEK IMMEDIATE MEDICAL CARE IF:   You have blood or brown flecks (like coffee grounds) in your vomit.   You have black or bloody stools.   You have a severe headache or stiff neck.   You are confused.   You have severe abdominal pain.   You have chest pain or trouble breathing.   You do not urinate at least once every 8 hours.   You develop cold or clammy skin.   You continue to vomit for longer than 24 to 48 hours.   You have a fever.  MAKE SURE YOU:   Understand these instructions.   Will watch your  condition.   Will get help right away if you are not doing well or get worse.  Document Released: 05/02/2005 Document Revised: 01/12/2011 Document Reviewed: 09/29/2010 [facility] Patient Information 2012 [place], [place].

## 2011-04-29 NOTE — Assessment & Plan Note (Signed)
I will check her labs to see if there is some organ pathology to explain this like pancreatitis, hepatitis, infection. I am concerned that she my have "cyclical N/V syndrome" and that this may have been triggered by her GM's death. She will try zofran to control the N/V.

## 2011-05-03 ENCOUNTER — Ambulatory Visit (INDEPENDENT_AMBULATORY_CARE_PROVIDER_SITE_OTHER): Payer: Commercial Managed Care - PPO | Admitting: Professional

## 2011-05-03 DIAGNOSIS — F331 Major depressive disorder, recurrent, moderate: Secondary | ICD-10-CM

## 2011-05-04 ENCOUNTER — Telehealth: Payer: Self-pay

## 2011-05-04 NOTE — Telephone Encounter (Signed)
Patient called [facility] requesting lab results Thanks

## 2011-05-04 NOTE — Telephone Encounter (Signed)
All labs were normal

## 2011-05-05 ENCOUNTER — Ambulatory Visit: Payer: Commercial Managed Care - PPO | Admitting: Professional

## 2011-05-05 NOTE — Telephone Encounter (Signed)
Called patient//[facility] 

## 2011-05-06 ENCOUNTER — Ambulatory Visit: Payer: Commercial Managed Care - PPO

## 2011-05-06 ENCOUNTER — Ambulatory Visit (INDEPENDENT_AMBULATORY_CARE_PROVIDER_SITE_OTHER): Payer: Commercial Managed Care - PPO | Admitting: Internal Medicine

## 2011-05-06 ENCOUNTER — Encounter: Payer: Self-pay | Admitting: Internal Medicine

## 2011-05-06 VITALS — BP 112/74 | HR 90 | Temp 99.3°F | Resp 16 | Wt 168.0 lb

## 2011-05-06 DIAGNOSIS — R8281 Pyuria: Secondary | ICD-10-CM

## 2011-05-06 DIAGNOSIS — R82998 Other abnormal findings in urine: Secondary | ICD-10-CM

## 2011-05-06 DIAGNOSIS — R112 Nausea with vomiting, unspecified: Secondary | ICD-10-CM | POA: Insufficient documentation

## 2011-05-06 LAB — COMPREHENSIVE METABOLIC PANEL
Albumin: 3.7 g/dL (ref 3.5–5.2)
Alkaline Phosphatase: 98 U/L (ref 39–117)
BUN: 8 mg/dL (ref 6–23)
CO2: 28 mEq/L (ref 19–32)
Calcium: 8.5 mg/dL (ref 8.4–10.5)
Chloride: 102 mEq/L (ref 96–112)
GFR: 83.52 mL/min (ref >60.00)
Glucose, Bld: 97 mg/dL (ref 70–99)
Potassium: 3.8 mEq/L (ref 3.5–5.1)

## 2011-05-06 LAB — URINALYSIS, ROUTINE W REFLEX MICROSCOPIC
Bilirubin Urine: NEGATIVE
Ketones, ur: NEGATIVE
Nitrite: NEGATIVE
pH: 6 (ref 5.0–8.0)

## 2011-05-06 NOTE — Patient Instructions (Signed)
Nausea and Vomiting Nausea is a sick feeling that often comes before throwing up (vomiting). Vomiting is a reflex where stomach contents come out of your mouth. Vomiting can cause severe loss of body fluids (dehydration). Children and elderly adults can become dehydrated quickly, especially if they also have diarrhea. Nausea and vomiting are symptoms of a condition or disease. It is important to find the cause of your symptoms. CAUSES   Direct irritation of the stomach lining. This irritation can result from increased acid production (gastroesophageal reflux disease), infection, food poisoning, taking certain medicines (such as nonsteroidal anti-inflammatory drugs), alcohol use, or tobacco use.   Signals from the brain.These signals could be caused by a headache, heat exposure, an inner ear disturbance, increased pressure in the brain from injury, infection, a tumor, or a concussion, pain, emotional stimulus, or metabolic problems.   An obstruction in the gastrointestinal tract (bowel obstruction).   Illnesses such as diabetes, hepatitis, gallbladder problems, appendicitis, kidney problems, cancer, sepsis, atypical symptoms of a heart attack, or eating disorders.   Medical treatments such as chemotherapy and radiation.   Receiving medicine that makes you sleep (general anesthetic) during surgery.  DIAGNOSIS Your caregiver may ask for tests to be done if the problems do not improve after a few days. Tests may also be done if symptoms are severe or if the reason for the nausea and vomiting is not clear. Tests may include:  Urine tests.   Blood tests.   Stool tests.   Cultures (to look for evidence of infection).   X-rays or other imaging studies.  Test results can help your caregiver make decisions about treatment or the need for additional tests. TREATMENT You need to stay well hydrated. Drink frequently but in small amounts.You may wish to drink water, sports drinks, clear broth, or  eat frozen ice pops or gelatin dessert to help stay hydrated.When you eat, eating slowly may help prevent nausea.There are also some antinausea medicines that may help prevent nausea. HOME CARE INSTRUCTIONS   Take all medicine as directed by your caregiver.   If you do not have an appetite, do not force yourself to eat. However, you must continue to drink fluids.   If you have an appetite, eat a normal diet unless your caregiver tells you differently.   Eat a variety of complex carbohydrates (rice, wheat, potatoes, bread), lean meats, yogurt, fruits, and vegetables.   Avoid high-fat foods because they are more difficult to digest.   Drink enough water and fluids to keep your urine clear or pale yellow.   If you are dehydrated, ask your caregiver for specific rehydration instructions. Signs of dehydration may include:   Severe thirst.   Dry lips and mouth.   Dizziness.   Dark urine.   Decreasing urine frequency and amount.   Confusion.   Rapid breathing or pulse.  SEEK IMMEDIATE MEDICAL CARE IF:   You have blood or brown flecks (like coffee grounds) in your vomit.   You have black or bloody stools.   You have a severe headache or stiff neck.   You are confused.   You have severe abdominal pain.   You have chest pain or trouble breathing.   You do not urinate at least once every 8 hours.   You develop cold or clammy skin.   You continue to vomit for longer than 24 to 48 hours.   You have a fever.  MAKE SURE YOU:   Understand these instructions.   Will watch your   condition.   Will get help right away if you are not doing well or get worse.  Document Released: 05/02/2005 Document Revised: 01/12/2011 Document Reviewed: 09/29/2010 [employer] Patient Information 2012 [employer]. 

## 2011-05-06 NOTE — Progress Notes (Signed)
  Subjective:    Patient ID: Patty Stanley, female    DOB: 03-05-1981, 30 y.o.   MRN: [id number]  HPI She returns c/o persistent nausea with rare episodes of vomiting.   Review of Systems  Constitutional: Negative for fever, chills, diaphoresis, activity change, appetite change, fatigue and unexpected weight change.  HENT: Negative.   Eyes: Negative.   Respiratory: Negative for cough, chest tightness, shortness of breath, wheezing and stridor.   Cardiovascular: Negative for chest pain, palpitations and leg swelling.  Gastrointestinal: Positive for nausea and vomiting. Negative for abdominal pain, diarrhea, constipation, blood in stool, abdominal distention, anal bleeding and rectal pain.  Genitourinary: Negative.  Negative for dysuria, urgency, frequency, hematuria, flank pain, decreased urine volume, enuresis, difficulty urinating and dyspareunia.  Musculoskeletal: Negative for myalgias, back pain, joint swelling, arthralgias and gait problem.  Skin: Negative for color change, pallor, rash and wound.  Neurological: Negative for dizziness, tremors, seizures, syncope, facial asymmetry, speech difficulty, weakness, light-headedness, numbness and headaches.  Hematological: Negative for adenopathy. Does not bruise/bleed easily.  Psychiatric/Behavioral: Negative.        Objective:   Physical Exam  Vitals reviewed. Constitutional: She is oriented to person, place, and time. She appears well-developed and well-nourished. No distress.  HENT:  Head: Normocephalic and atraumatic.  Mouth/Throat: Oropharynx is clear and moist. No oropharyngeal exudate.  Eyes: Conjunctivae are normal. Right eye exhibits no discharge. Left eye exhibits no discharge. No scleral icterus.  Neck: Normal range of motion. Neck supple. No JVD present. No tracheal deviation present. No thyromegaly present.  Cardiovascular: Normal rate, regular rhythm, normal heart sounds and intact distal pulses.  Exam reveals no gallop and  no friction rub.   No murmur heard. Pulmonary/Chest: Effort normal and breath sounds normal. No stridor. No respiratory distress. She has no wheezes. She has no rales. She exhibits no tenderness.  Abdominal: Soft. Bowel sounds are normal. She exhibits no distension and no mass. There is no tenderness. There is no rebound and no guarding.  Musculoskeletal: Normal range of motion. She exhibits no edema and no tenderness.  Lymphadenopathy:    She has no cervical adenopathy.  Neurological: She is oriented to person, place, and time.  Skin: Skin is warm and dry. No rash noted. She is not diaphoretic. No erythema. No pallor.  Psychiatric: She has a normal mood and affect. Her behavior is normal. Judgment and thought content normal.     Lab Results  Component Value Date   WBC 8.2 04/29/2011   HGB 12.8 04/29/2011   HCT 38.4 04/29/2011   PLT 372.0 04/29/2011   GLUCOSE 91 04/29/2011   ALT 37* 04/29/2011   AST 25 04/29/2011   NA 143 04/29/2011   K 3.9 04/29/2011   CL 108 04/29/2011   CREATININE 0.9 04/29/2011   BUN 11 04/29/2011   CO2 29 04/29/2011   TSH 2.23 12/10/2010       Assessment & Plan:

## 2011-05-09 ENCOUNTER — Encounter: Payer: Self-pay | Admitting: Internal Medicine

## 2011-05-09 ENCOUNTER — Telehealth: Payer: Self-pay

## 2011-05-09 ENCOUNTER — Other Ambulatory Visit (INDEPENDENT_AMBULATORY_CARE_PROVIDER_SITE_OTHER): Payer: Commercial Managed Care - PPO

## 2011-05-09 ENCOUNTER — Telehealth: Payer: Self-pay | Admitting: *Deleted

## 2011-05-09 DIAGNOSIS — R8281 Pyuria: Secondary | ICD-10-CM | POA: Insufficient documentation

## 2011-05-09 DIAGNOSIS — R82998 Other abnormal findings in urine: Secondary | ICD-10-CM

## 2011-05-09 LAB — URINALYSIS, ROUTINE W REFLEX MICROSCOPIC
Nitrite: NEGATIVE
Specific Gravity, Urine: <=1.005 (ref 1.000–1.030)
Total Protein, Urine: NEGATIVE
Urobilinogen, UA: 0.2 (ref 0.0–1.0)

## 2011-05-09 LAB — HEPATITIS PANEL, ACUTE
HCV Ab: NEGATIVE
Hep B C IgM: NEGATIVE
Hepatitis B Surface Ag: NEGATIVE

## 2011-05-09 MED ORDER — SULFAMETHOXAZOLE-TRIMETHOPRIM 800-160 MG PO TABS
1.0000 | ORAL_TABLET | Freq: Two times a day (BID) | ORAL | Status: AC
Start: 2011-05-09 — End: 2011-05-19

## 2011-05-09 NOTE — Progress Notes (Signed)
Addended by: Etta Grandchild on: 05/09/2011 12:57 PM   Modules accepted: Orders

## 2011-05-09 NOTE — Telephone Encounter (Signed)
Pt states she received call to return to lab for secondary U/A per phone call this AM to receive ABX--Do not see order in EMR. Please advise.

## 2011-05-09 NOTE — Telephone Encounter (Signed)
Order placed

## 2011-05-09 NOTE — Telephone Encounter (Signed)
Patient stopped by office, filled out walk in sheet requesting UA results

## 2011-05-09 NOTE — Assessment & Plan Note (Addendum)
I see no obvious cause for the N/V, I will check her labs to look for organic causes and have asked her to get an u/s done to look for structural causes of n/v ( gallstones, liver lesions, etc)

## 2011-05-09 NOTE — Assessment & Plan Note (Signed)
I have asked her to come in for a urine culture

## 2011-05-09 NOTE — Assessment & Plan Note (Addendum)
I will recheck her liver enzymes and will look for viral hepatitis, I will check an u/s of the abd to look for masses and will look at her liver for fatty liver disease

## 2011-05-09 NOTE — Telephone Encounter (Signed)
Patent informed

## 2011-05-11 NOTE — Telephone Encounter (Signed)
Called patient no answer.

## 2011-05-11 NOTE — Telephone Encounter (Signed)
Normal so far, the culture is not back yet

## 2011-05-11 NOTE — Telephone Encounter (Signed)
Pt informed but is still requesting an ABX and to speak with [facility]'s CMA.

## 2011-05-13 ENCOUNTER — Ambulatory Visit
Admission: RE | Admit: 2011-05-13 | Discharge: 2011-05-13 | Disposition: A | Discharge status: Home / Self Care | Payer: Commercial Managed Care - PPO | Source: Ambulatory Visit | Attending: Internal Medicine | Admitting: Internal Medicine

## 2011-05-13 DIAGNOSIS — R112 Nausea with vomiting, unspecified: Secondary | ICD-10-CM

## 2011-05-26 ENCOUNTER — Telehealth: Payer: Self-pay

## 2011-05-26 NOTE — Telephone Encounter (Signed)
Pt called requesting result of recent U/S, please advise.

## 2011-05-26 NOTE — Telephone Encounter (Signed)
Pt advised via VM 

## 2011-05-26 NOTE — Telephone Encounter (Signed)
normal

## 2011-05-31 ENCOUNTER — Ambulatory Visit (INDEPENDENT_AMBULATORY_CARE_PROVIDER_SITE_OTHER): Payer: Commercial Managed Care - PPO | Admitting: Professional

## 2011-05-31 DIAGNOSIS — F331 Major depressive disorder, recurrent, moderate: Secondary | ICD-10-CM

## 2011-06-07 ENCOUNTER — Ambulatory Visit (INDEPENDENT_AMBULATORY_CARE_PROVIDER_SITE_OTHER): Payer: Commercial Managed Care - PPO | Admitting: Professional

## 2011-06-07 DIAGNOSIS — F331 Major depressive disorder, recurrent, moderate: Secondary | ICD-10-CM

## 2011-06-09 ENCOUNTER — Other Ambulatory Visit: Payer: Self-pay | Admitting: Internal Medicine

## 2011-06-09 ENCOUNTER — Ambulatory Visit: Payer: Commercial Managed Care - PPO | Admitting: Professional

## 2011-06-14 ENCOUNTER — Ambulatory Visit (INDEPENDENT_AMBULATORY_CARE_PROVIDER_SITE_OTHER): Payer: Commercial Managed Care - PPO | Admitting: Professional

## 2011-06-14 DIAGNOSIS — F331 Major depressive disorder, recurrent, moderate: Secondary | ICD-10-CM

## 2011-06-27 ENCOUNTER — Encounter: Payer: Self-pay | Admitting: Internal Medicine

## 2011-06-27 ENCOUNTER — Ambulatory Visit (INDEPENDENT_AMBULATORY_CARE_PROVIDER_SITE_OTHER): Payer: Commercial Managed Care - PPO | Admitting: Internal Medicine

## 2011-06-27 VITALS — BP 116/68 | HR 84 | Temp 98.0°F | Resp 20 | Wt 165.0 lb

## 2011-06-27 DIAGNOSIS — J069 Acute upper respiratory infection, unspecified: Secondary | ICD-10-CM

## 2011-06-27 DIAGNOSIS — R112 Nausea with vomiting, unspecified: Secondary | ICD-10-CM

## 2011-06-27 MED ORDER — PROMETHAZINE-DM 6.25-15 MG/5ML PO SYRP: 5.0000 mL | ORAL_SOLUTION | Freq: Four times a day (QID) | ORAL | Status: AC | PRN

## 2011-06-27 NOTE — Assessment & Plan Note (Signed)
She does not need antibiotics, will try a cough suppressant and she will let me know of she develops any new or worsening symptoms

## 2011-06-27 NOTE — Patient Instructions (Signed)

## 2011-06-27 NOTE — Progress Notes (Signed)
  Subjective:    Patient ID: Patty Stanley, female    DOB: [birth year], 31 y.o.   MRN: [id number]  Cough This is a new problem. The current episode started in the past 7 days. The problem has been gradually improving. The problem occurs every few hours. The cough is non-productive. Associated symptoms include chills, nasal congestion, postnasal drip and rhinorrhea. Pertinent negatives include no chest pain, ear congestion, ear pain, fever, headaches, heartburn, hemoptysis, myalgias, rash, sore throat, shortness of breath, sweats, weight loss or wheezing. The symptoms are aggravated by nothing. She has tried nothing for the symptoms. Her past medical history is significant for asthma.      Review of Systems  Constitutional: Positive for chills. Negative for fever, weight loss, diaphoresis, activity change, appetite change, fatigue and unexpected weight change.  HENT: Positive for rhinorrhea and postnasal drip. Negative for ear pain, sore throat, sneezing and sinus pressure.   Eyes: Negative.   Respiratory: Positive for cough. Negative for apnea, hemoptysis, choking, chest tightness, shortness of breath, wheezing and stridor.   Cardiovascular: Negative for chest pain, palpitations and leg swelling.  Gastrointestinal: Positive for nausea and vomiting (she has had recurrent cycles on N/V over the last year). Negative for heartburn, abdominal pain, diarrhea, constipation, blood in stool, abdominal distention, anal bleeding and rectal pain.  Genitourinary: Negative.   Musculoskeletal: Negative for myalgias, back pain, joint swelling, arthralgias and gait problem.  Skin: Negative for color change, pallor, rash and wound.  Neurological: Negative.  Negative for headaches.  Hematological: Negative for adenopathy. Does not bruise/bleed easily.  Psychiatric/Behavioral: Negative.        Objective:   Physical Exam  Vitals reviewed. Constitutional: She is oriented to person, place, and time. She appears  well-developed and well-nourished. No distress.  HENT:  Head: Normocephalic and atraumatic.  Mouth/Throat: Oropharynx is clear and moist. No oropharyngeal exudate.  Eyes: Conjunctivae are normal. Right eye exhibits no discharge. Left eye exhibits no discharge. No scleral icterus.  Neck: Normal range of motion. Neck supple. No JVD present. No tracheal deviation present. No thyromegaly present.  Cardiovascular: Normal rate, regular rhythm, normal heart sounds and intact distal pulses.  Exam reveals no gallop and no friction rub.   No murmur heard. Pulmonary/Chest: Effort normal and breath sounds normal. No stridor. No respiratory distress. She has no wheezes. She has no rales. She exhibits no tenderness.  Abdominal: Soft. Bowel sounds are normal. She exhibits no distension and no mass. There is no tenderness. There is no rebound and no guarding.  Musculoskeletal: Normal range of motion. She exhibits no edema and no tenderness.  Lymphadenopathy:    She has no cervical adenopathy.  Neurological: She is oriented to person, place, and time.  Skin: Skin is warm and dry. No rash noted. She is not diaphoretic. No erythema. No pallor.  Psychiatric: She has a normal mood and affect. Her behavior is normal. Judgment and thought content normal.      Lab Results  Component Value Date   WBC 8.2 04/29/2011   HGB 12.8 04/29/2011   HCT 38.4 04/29/2011   PLT 372.0 04/29/2011   GLUCOSE 97 05/06/2011   ALT 16 05/06/2011   AST 16 05/06/2011   NA 137 05/06/2011   K 3.8 05/06/2011   CL 102 05/06/2011   CREATININE 0.8 05/06/2011   BUN 8 05/06/2011   CO2 28 05/06/2011   TSH 2.23 12/10/2010      Assessment & Plan:

## 2011-06-27 NOTE — Assessment & Plan Note (Signed)
I am concerned that she may have UGI pathology or gastroparesis so I have asked her to see GI

## 2011-06-28 ENCOUNTER — Encounter: Payer: Self-pay | Admitting: Internal Medicine

## 2011-06-28 ENCOUNTER — Ambulatory Visit: Payer: Commercial Managed Care - PPO | Admitting: Professional

## 2011-06-30 ENCOUNTER — Encounter: Payer: Self-pay | Admitting: Internal Medicine

## 2011-07-04 ENCOUNTER — Encounter: Payer: Self-pay | Admitting: Internal Medicine

## 2011-07-04 ENCOUNTER — Ambulatory Visit (INDEPENDENT_AMBULATORY_CARE_PROVIDER_SITE_OTHER): Payer: Commercial Managed Care - PPO | Admitting: Internal Medicine

## 2011-07-04 VITALS — BP 106/74 | HR 64 | Ht 59.0 in | Wt 166.0 lb

## 2011-07-04 DIAGNOSIS — R112 Nausea with vomiting, unspecified: Secondary | ICD-10-CM

## 2011-07-04 MED ORDER — OMEPRAZOLE 20 MG PO CPDR: 20.0000 mg | DELAYED_RELEASE_CAPSULE | Freq: Two times a day (BID) | ORAL | Status: DC

## 2011-07-04 NOTE — Progress Notes (Signed)
Subjective:    Patient ID: Patty Stanley, female    DOB: Jun 09, 1980, 31 y.o.   MRN: [id number]  HPI Patty Stanley is a 31 yo female with PMH of asthma, depression, low back pain, and nausea with vomiting who seen in consultation at the request of Dr. Yetta Barre for evaluation of nausea and vomiting. The patient reports ongoing and intermittent nausea and vomiting over the past 2 months. She reports this happens more frequently in the morning after waking up. It does not wake her up in the middle of the night. She reports she usually vomits clear liquids and denies hematemesis. She does not feel that she is vomiting food from the day before.  She reports that she can quickly be hit with nausea and then vomiting with little warning sign. She was given a prescription for ondansetron which she feels is helping significantly with her nausea. She is using this as needed. She does report decrease in appetite but she is uncertain if she has lost weight.  She denies trouble eating, and does not feel that eating makes her nausea or vomiting worse. She denies heartburn, dysphagia, and odynophagia. Reports normal bowel movements without diarrhea, constipation, bright red blood per rectum, or melena. No fevers or chills. She does report headaches which are long-standing and not new for her. We reviewed her medications, and she reports none are new within the last several months.  Review of Systems Constitutional: Negative for fever, chills, night sweats, activity change, appetite change and unexpected weight change HEENT: Negative for sore throat, mouth sores and trouble swallowing. Eyes: Negative for visual disturbance Respiratory: Negative for cough, chest tightness and shortness of breath Cardiovascular: Negative for chest pain, palpitations and lower extremity swelling Gastrointestinal: See history of present illness Genitourinary: Negative for dysuria and hematuria. Musculoskeletal: Negative for back pain,  arthralgias and myalgias Skin: Negative for rash or color change Neurological: Positive for headaches, negative for weakness, numbness Hematological: Negative for adenopathy, negative for easy bruising/bleeding Psychiatric/behavioral: Positive for depressed mood, positive for anxiety   Patient Active Problem List  Diagnoses  . DEPRESSION  . ALLERGIC RHINITIS  . ASTHMA  . OSTEOARTHRITIS  . Low back pain  . Nausea & vomiting  . Viral URI with cough   Past Surgical History  Procedure Date  . Wisdom tooth extraction    Current Outpatient Prescriptions  Medication Sig Dispense Refill  . albuterol (PROVENTIL,VENTOLIN) 90 MCG/ACT inhaler Inhale 2 puffs into the lungs every 6 (six) hours as needed for wheezing.  17 g  12  . beclomethasone (QVAR) 80 MCG/ACT inhaler Inhale 1 puff into the lungs 2 (two) times daily.  1 Inhaler  0  . Cholecalciferol (VITAMIN D) 2000 UNITS CAPS Take by mouth daily.        . montelukast (SINGULAIR) 10 MG tablet Take 1 tablet (10 mg total) by mouth daily.  90 tablet  3  . norgestimate-ethinyl estradiol (SPRINTEC 28) 0.25-35 MG-MCG per tablet Take 1 tablet by mouth daily.        . ondansetron (ZOFRAN) 4 MG tablet TAKE 1 TABLET (4 MG TOTAL) BY MOUTH DAILY AS NEEDED FOR NAUSEA.  30 tablet  1  . promethazine-dextromethorphan (PROMETHAZINE-DM) 6.25-15 MG/5ML syrup Take 5 mLs by mouth 4 (four) times daily as needed for cough.  118 mL  0  . propranolol (INDERAL LA) 80 MG 24 hr capsule TAKE 1 CAPSULE (80 MG TOTAL) BY MOUTH DAILY.  30 capsule  5  . rOPINIRole (REQUIP) 0.5 MG tablet Take 1  tablet (0.5 mg total) by mouth at bedtime as needed.  30 tablet  1  . zolpidem (AMBIEN) 10 MG tablet Take 10 mg by mouth at bedtime as needed.         Allergies  Allergen Reactions  . Penicillins     REACTION: Itching   Family History  Problem Relation Age of Onset  . Alcohol abuse Other   . Arthritis Other    Social History  . Marital Status: Married   Occupational History    . [employer]    Social History Main Topics  . Smoking status: Never Smoker   . Smokeless tobacco: Never Used  . Alcohol Use: No  . Drug Use: No  . Sexually Active: Yes    Birth Control/ Protection: Pill      Objective:   Physical Exam BP 106/74  Pulse 64  Ht 4\' 11"  (1.499 m)  Wt 166 lb (75.297 kg)  BMI 33.53 kg/m2  LMP 07/01/2011 Constitutional: Well-developed and well-nourished. No distress. HEENT: Normocephalic and atraumatic. Oropharynx is clear and moist. No oropharyngeal exudate. Conjunctivae are normal. Pupils are equal round and reactive to light. No scleral icterus. Neck: Neck supple. Trachea midline. Cardiovascular: Normal rate, regular rhythm and intact distal pulses. No M/R/G Pulmonary/chest: Effort normal and breath sounds normal. No wheezing, rales or rhonchi. Abdominal: Soft, nontender, nondistended. Bowel sounds active throughout. There are no masses palpable. No hepatosplenomegaly. Extremities: no clubbing, cyanosis, or edema Lymphadenopathy: No cervical adenopathy noted. Neurological: Alert and oriented to person place and time. Skin: Skin is warm and dry. No rashes noted. Psychiatric: Normal mood and affect. Behavior is normal.  CMP     Component Value Date/Time   NA 137 05/06/2011 1634   K 3.8 05/06/2011 1634   CL 102 05/06/2011 1634   CO2 28 05/06/2011 1634   GLUCOSE 97 05/06/2011 1634   BUN 8 05/06/2011 1634   CREATININE 0.8 05/06/2011 1634   CALCIUM 8.5 05/06/2011 1634   PROT 7.4 05/06/2011 1634   ALBUMIN 3.7 05/06/2011 1634   AST 16 05/06/2011 1634   ALT 16 05/06/2011 1634   ALKPHOS 98 05/06/2011 1634   BILITOT 0.7 05/06/2011 1634    CBC    Component Value Date/Time   WBC 8.2 04/29/2011 1050   RBC 4.16 04/29/2011 1050   HGB 12.8 04/29/2011 1050   HCT 38.4 04/29/2011 1050   PLT 372.0 04/29/2011 1050   MCV 92.2 04/29/2011 1050   MCHC 33.4 04/29/2011 1050   RDW 12.5 04/29/2011 1050   LYMPHSABS 3.3 04/29/2011 1050   MONOABS 0.7 04/29/2011  1050   EOSABS 0.2 04/29/2011 1050   BASOSABS 0.0 04/29/2011 1050    Lipase     Component Value Date/Time   LIPASE 44.0 05/06/2011 1634   Clinical Data:  Nausea and vomiting   COMPLETE ABDOMINAL ULTRASOUND 05/06/11   Comparison:  None.   Findings:   Gallbladder:  No gallstones, gallbladder wall thickening, or pericholecystic fluid.  Negative sonographic Murphy's sign.   Common bile duct:  Normal in size measuring 2 mm in diameter   Liver:  Homogeneous hepatic echotexture.  No discrete hepatic lesions.  No ascites.   IVC:  Appears normal.   Pancreas:  Largely obscured by bowel gas.   Spleen:  Largely obscured by bowel gas.   Right Kidney:  Normal cortical thickness, echogenicity and size, measuring 9.8 cm in length.  No focal renal lesions.  No echogenic renal stones.  No urinary obstruction.   Left Kidney:  Normal  cortical thickness, echogenicity and size, measuring 10.1 cm in length.  No focal renal lesions.  No echogenic renal stones.  No urinary obstruction.   Abdominal aorta:  No aneurysm identified.   IMPRESSION: No explanation for patient's nausea and vomiting.  Specifically, no evidence of cholelithiasis or urinary obstruction.     Assessment & Plan:   31 yo female with PMH of asthma, depression, low back pain, and nausea with vomiting who seen in consultation at the request of Dr. Yetta Barre for evaluation of nausea and vomiting.  1. N/V -- the differential for her nausea and vomiting is broad, but it has been going on long enough now to investigate further.  She is getting benefit from Zofran, and I will give her a refill for ondansetron 4 mg q. 8 hours when necessary nausea. I will give her the ODT formulation.  We have discussed and I recommended upper endoscopy to rule out structural lesions/PUD.  She may benefit from acid suppression, but I like to perform the upper endoscopy first before making this decision.  If upper endoscopy is unremarkable then we could  consider gastric emptying study to rule out gastroparesis, or CCK-HIDA, or even further imaging such as cross-sectional image/CT scan.  Further evaluation after endoscopy

## 2011-07-04 NOTE — Patient Instructions (Signed)
You have been scheduled for an endoscopy with propofol. Please follow written instructions given to you at your visit today.  We have sent the following medications to your pharmacy for you to pick up at your convenience:zofran  Dr. Rhea Belton would like to see you back in the office 1 month after your procedure

## 2011-07-05 ENCOUNTER — Other Ambulatory Visit: Payer: Commercial Managed Care - PPO | Admitting: Internal Medicine

## 2011-07-11 ENCOUNTER — Encounter: Payer: Self-pay | Admitting: Internal Medicine

## 2011-07-11 ENCOUNTER — Ambulatory Visit (AMBULATORY_SURGERY_CENTER): Payer: Commercial Managed Care - PPO | Admitting: Internal Medicine

## 2011-07-11 VITALS — BP 114/65 | HR 83 | Temp 97.8°F | Resp 20 | Ht 59.0 in | Wt 166.0 lb

## 2011-07-11 DIAGNOSIS — K317 Polyp of stomach and duodenum: Secondary | ICD-10-CM

## 2011-07-11 DIAGNOSIS — K2970 Gastritis, unspecified, without bleeding: Secondary | ICD-10-CM

## 2011-07-11 DIAGNOSIS — R112 Nausea with vomiting, unspecified: Secondary | ICD-10-CM

## 2011-07-11 DIAGNOSIS — K2990 Gastroduodenitis, unspecified, without bleeding: Secondary | ICD-10-CM

## 2011-07-11 DIAGNOSIS — K2960 Other gastritis without bleeding: Secondary | ICD-10-CM

## 2011-07-11 MED ORDER — ESOMEPRAZOLE MAGNESIUM 40 MG PO PACK: 40.0000 mg | PACK | Freq: Every day | ORAL | Status: DC

## 2011-07-11 MED ORDER — SODIUM CHLORIDE 0.9 % IV SOLN: 500.0000 mL | INTRAVENOUS | Status: DC

## 2011-07-11 NOTE — Progress Notes (Signed)
Patient did not experience any of the following events: a burn prior to discharge; a fall within the facility; wrong site/side/patient/procedure/implant event; or a hospital transfer or hospital admission upon discharge from the facility. (G8907) Patient did not have preoperative order for IV antibiotic SSI prophylaxis. (G8918)  

## 2011-07-11 NOTE — Op Note (Signed)
[facility] [address] [place], [place]  [address]  ENDOSCOPY PROCEDURE REPORT  PATIENT:  Patty Stanley, Patty Stanley  MR#:  [id number] BIRTHDATE:  Aug 12, 1980, 30 yrs. old  GENDER:  female ENDOSCOPIST:  Carie Caddy. Crimson Beer, MD Referred by:  Etta Grandchild, M.D. PROCEDURE DATE:  07/11/2011 PROCEDURE:  EGD with biopsy, 43239 ASA CLASS:  Class II INDICATIONS:  nausea and vomiting MEDICATIONS:   These medications were titrated to patient response per physician's verbal order, Fentanyl 50 mcg IV, Versed 6 mg IV, Benadryl 12.5 mg IV TOPICAL ANESTHETIC:  Cetacaine Spray  DESCRIPTION OF PROCEDURE:   After the risks benefits and alternatives of the procedure were thoroughly explained, informed consent was obtained.  The LB [id number]-H180 [id number] endoscope was introduced through the mouth and advanced to the second portion of the duodenum, without limitations.  The instrument was slowly withdrawn as the mucosa was fully examined. <<PROCEDUREIMAGES>> The esophagus and gastroesophageal junction were completely normal in appearance.  Mild gastritis with superficial erosion was found antrum Multiple biopsies were obtained from gastric body and antrum and sent to pathology.  A 3 mm sessile polyp was found in the fundus. Multiple biopsies were obtained and sent to pathology. The duodenal bulb was normal in appearance, as was the postbulbar duodenum.    Retroflexed views revealed no abnormalities.    The scope was then withdrawn from the patient and the procedure completed.  COMPLICATIONS:  None  ENDOSCOPIC IMPRESSION: 1) Normal esophagus 2) Mild gastritis in the antrum.  Multiple biopsies taken and sent to pathology. 3) Sessile polyp in the fundus.  Removed by biopsy and sent to pathology. 4) Normal duodenum RECOMMENDATIONS: 1) Await pathology results 2) Given inflammation seen today, begin pantoprazole 40 mg daily. This is best taken 30 min to 1 hour before your 1st meal of the day. 3) Office  follow-up in 1 month to reassess symptoms.  Carie Caddy. Rhea Belton, MD  CC:  Etta Grandchild, MD The Patient  n. eSIGNEDCarie Caddy. Jevon Shells at 07/11/2011 02:30 PM  Wilburt Finlay, [phone]

## 2011-07-11 NOTE — Patient Instructions (Signed)
PLEASE CALL TOMORROW TO SCHEDULE AN OFFICE VISIT IN ONE MONTH   YOU HAD AN ENDOSCOPIC PROCEDURE TODAY AT THE [facility]: Refer to the procedure report that was given to you for any specific questions about what was found during the examination.  If the procedure report does not answer your questions, please call your gastroenterologist to clarify.  If you requested that your care partner not be given the details of your procedure findings, then the procedure report has been included in a sealed envelope for you to review at your convenience later.  YOU SHOULD EXPECT: Some feelings of bloating in the abdomen. Passage of more gas than usual.  Walking can help get rid of the air that was put into your GI tract during the procedure and reduce the bloating. If you had a lower endoscopy (such as a colonoscopy or flexible sigmoidoscopy) you may notice spotting of blood in your stool or on the toilet paper. If you underwent a bowel prep for your procedure, then you may not have a normal bowel movement for a few days.  DIET: Your first meal following the procedure should be a light meal and then it is ok to progress to your normal diet.  A half-sandwich or bowl of soup is an example of a good first meal.  Heavy or fried foods are harder to digest and may make you feel nauseous or bloated.  Likewise meals heavy in dairy and vegetables can cause extra gas to form and this can also increase the bloating.  Drink plenty of fluids but you should avoid alcoholic beverages for 24 hours.  ACTIVITY: Your care partner should take you home directly after the procedure.  You should plan to take it easy, moving slowly for the rest of the day.  You can resume normal activity the day after the procedure however you should NOT DRIVE or use heavy machinery for 24 hours (because of the sedation medicines used during the test).    SYMPTOMS TO REPORT IMMEDIATELY: A gastroenterologist can be reached at any hour.  During  normal business hours, 8:30 AM to 5:00 PM Monday through Friday, call [phone].  After hours and on weekends, please call the GI answering service at [phone] who will take a message and have the physician on call contact you.   Following upper endoscopy (EGD)  Vomiting of blood or coffee ground material  New chest pain or pain under the shoulder blades  Painful or persistently difficult swallowing  New shortness of breath  Fever of 100F or higher  Black, tarry-looking stools  FOLLOW UP: If any biopsies were taken you will be contacted by phone or by letter within the next 1-3 weeks.  Call your gastroenterologist if you have not heard about the biopsies in 3 weeks.  Our staff will call the home number listed on your records the next business day following your procedure to check on you and address any questions or concerns that you may have at that time regarding the information given to you following your procedure. This is a courtesy call and so if there is no answer at the home number and we have not heard from you through the emergency physician on call, we will assume that you have returned to your regular daily activities without incident.  SIGNATURES/CONFIDENTIALITY: You and/or your care partner have signed paperwork which will be entered into your electronic medical record.  These signatures attest to the fact that that the information above on  your After Visit Summary has been reviewed and is understood.  Full responsibility of the confidentiality of this discharge information lies with you and/or your care-partner.

## 2011-07-12 ENCOUNTER — Telehealth: Payer: Self-pay | Admitting: *Deleted

## 2011-07-12 ENCOUNTER — Other Ambulatory Visit: Payer: Self-pay | Admitting: Internal Medicine

## 2011-07-12 NOTE — Telephone Encounter (Signed)
  Follow up Call-  Call back number 07/11/2011  Post procedure Call Back phone  # [phone]  Permission to leave phone message Yes     Patient questions:  Do you have a fever, pain , or abdominal swelling? no Pain Score  0 *  Have you tolerated food without any problems? yes  Have you been able to return to your normal activities? yes  Do you have any questions about your discharge instructions: Diet   no Medications  no Follow up visit  no  Do you have questions or concerns about your Care? no  Actions: * If pain score is 4 or above: No action needed, pain <4.

## 2011-07-18 ENCOUNTER — Encounter: Payer: Self-pay | Admitting: Internal Medicine

## 2011-07-20 ENCOUNTER — Telehealth: Payer: Self-pay | Admitting: *Deleted

## 2011-07-20 DIAGNOSIS — R112 Nausea with vomiting, unspecified: Secondary | ICD-10-CM

## 2011-07-20 NOTE — Telephone Encounter (Deleted)
Letter from: Beverley Fiedler Reason for Letter: Results Review Comments: 07/18/11 Brantley Stage, can you please call Mr. Excell Seltzer to see if his symptoms have improved overall, and if not we need to reevaluate.

## 2011-07-20 NOTE — Telephone Encounter (Addendum)
Per Dr Rhea Belton, schedule a GES for pt.

## 2011-07-21 NOTE — Telephone Encounter (Signed)
Notified pt of GES for 08/03/11 at [facility] 07:45am for 8am appt; NPO after midnight; stop Nexium 8 hours prior. Pt stated understanding.

## 2011-07-21 NOTE — Telephone Encounter (Signed)
lmom for pt to call us back. Dr Rhea Belton would like to order a Gastric Emptying Scan to r/o Gastroparesis. Recent EGD showed mild inflammation only.

## 2011-07-24 ENCOUNTER — Other Ambulatory Visit: Payer: Self-pay | Admitting: Internal Medicine

## 2011-07-26 ENCOUNTER — Ambulatory Visit (INDEPENDENT_AMBULATORY_CARE_PROVIDER_SITE_OTHER): Payer: Commercial Managed Care - PPO | Admitting: Professional

## 2011-07-26 DIAGNOSIS — F331 Major depressive disorder, recurrent, moderate: Secondary | ICD-10-CM

## 2011-08-02 ENCOUNTER — Ambulatory Visit: Payer: Commercial Managed Care - PPO | Admitting: Professional

## 2011-08-03 ENCOUNTER — Encounter: Payer: Self-pay | Admitting: Internal Medicine

## 2011-08-03 ENCOUNTER — Encounter (HOSPITAL_COMMUNITY)
Admission: RE | Admit: 2011-08-03 | Discharge: 2011-08-03 | Disposition: A | Discharge status: Home / Self Care | Payer: Commercial Managed Care - PPO | Source: Ambulatory Visit | Attending: Internal Medicine | Admitting: Internal Medicine

## 2011-08-03 DIAGNOSIS — R112 Nausea with vomiting, unspecified: Secondary | ICD-10-CM | POA: Insufficient documentation

## 2011-08-03 DIAGNOSIS — R142 Eructation: Secondary | ICD-10-CM | POA: Insufficient documentation

## 2011-08-03 DIAGNOSIS — R141 Gas pain: Secondary | ICD-10-CM | POA: Insufficient documentation

## 2011-08-03 DIAGNOSIS — R6881 Early satiety: Secondary | ICD-10-CM | POA: Insufficient documentation

## 2011-08-03 DIAGNOSIS — R143 Flatulence: Secondary | ICD-10-CM | POA: Insufficient documentation

## 2011-08-03 MED ORDER — TECHNETIUM TC 99M SULFUR COLLOID
2.2000 | Freq: Once | INTRAVENOUS | Status: AC | PRN
  Administered 2011-08-03: 2.2 via INTRAVENOUS

## 2011-08-04 ENCOUNTER — Encounter: Payer: Self-pay | Admitting: Internal Medicine

## 2011-08-04 ENCOUNTER — Ambulatory Visit (INDEPENDENT_AMBULATORY_CARE_PROVIDER_SITE_OTHER): Payer: Commercial Managed Care - PPO | Admitting: Internal Medicine

## 2011-08-04 VITALS — BP 102/68 | HR 80 | Temp 97.0°F | Resp 16

## 2011-08-04 DIAGNOSIS — J309 Allergic rhinitis, unspecified: Secondary | ICD-10-CM

## 2011-08-04 DIAGNOSIS — H9190 Unspecified hearing loss, unspecified ear: Secondary | ICD-10-CM

## 2011-08-04 DIAGNOSIS — R112 Nausea with vomiting, unspecified: Secondary | ICD-10-CM

## 2011-08-04 DIAGNOSIS — H9193 Unspecified hearing loss, bilateral: Secondary | ICD-10-CM | POA: Insufficient documentation

## 2011-08-04 MED ORDER — MOMETASONE FUROATE 50 MCG/ACT NA SUSP: 2.0000 | Freq: Every day | NASAL | Status: DC

## 2011-08-04 NOTE — Progress Notes (Signed)
Subjective:    Patient ID: Patty Stanley, female    DOB: 11-Feb-1981, 31 y.o.   MRN: [id number]  Allergic Reaction This is a recurrent problem. The current episode started more than 1 week ago. The problem occurs intermittently. The problem is unchanged. The problem is mild. Associated with: pollen. The time of exposure is unknown. Pertinent negatives include no abdominal pain, chest pain, chest pressure, coughing, diarrhea, difficulty breathing, drooling, eye itching, eye redness, eye watering, globus sensation, hyperventilation, itching, rash, stridor, trouble swallowing or wheezing. There is no swelling present. Past treatments include nothing. Her past medical history is significant for asthma and seasonal allergies.      Review of Systems  Constitutional: Negative for fever, chills, diaphoresis, activity change, appetite change, fatigue and unexpected weight change.  HENT: Positive for hearing loss, congestion, rhinorrhea, sneezing and postnasal drip. Negative for ear pain, nosebleeds, sore throat, facial swelling, drooling, mouth sores, trouble swallowing, neck pain, neck stiffness, dental problem, voice change, sinus pressure and ear discharge.   Eyes: Negative for redness and itching.  Respiratory: Negative for cough, choking, wheezing and stridor.   Cardiovascular: Negative for chest pain, palpitations and leg swelling.  Gastrointestinal: Negative for abdominal pain, diarrhea, constipation, blood in stool, abdominal distention, anal bleeding and rectal pain.  Genitourinary: Negative.   Musculoskeletal: Negative.   Skin: Negative for color change, itching, pallor, rash and wound.  Neurological: Negative for dizziness, tremors, seizures, syncope, facial asymmetry, speech difficulty, weakness, light-headedness, numbness and headaches.  Hematological: Negative for adenopathy. Does not bruise/bleed easily.  Psychiatric/Behavioral: Negative.        Objective:   Physical Exam  Vitals  reviewed. Constitutional: She is oriented to person, place, and time. She appears well-developed and well-nourished. No distress.  HENT:  Head: Normocephalic and atraumatic. No trismus in the jaw.  Right Ear: Hearing, tympanic membrane, external ear and ear canal normal.  Left Ear: Hearing, tympanic membrane, external ear and ear canal normal.  Nose: Mucosal edema and rhinorrhea present. No nose lacerations, sinus tenderness, nasal deformity, septal deviation or nasal septal hematoma. No epistaxis.  No foreign bodies. Right sinus exhibits no maxillary sinus tenderness and no frontal sinus tenderness. Left sinus exhibits no maxillary sinus tenderness and no frontal sinus tenderness.  Mouth/Throat: Oropharynx is clear and moist. Mucous membranes are not pale, not dry and not cyanotic. No uvula swelling. No oropharyngeal exudate, posterior oropharyngeal edema, posterior oropharyngeal erythema or tonsillar abscesses.  Eyes: Conjunctivae are normal. Right eye exhibits no discharge. Left eye exhibits no discharge. No scleral icterus.  Neck: Normal range of motion. Neck supple. No JVD present. No tracheal deviation present. No thyromegaly present.  Cardiovascular: Normal rate, regular rhythm, normal heart sounds and intact distal pulses.  Exam reveals no gallop and no friction rub.   No murmur heard. Pulmonary/Chest: Effort normal and breath sounds normal. No stridor. No respiratory distress. She has no wheezes. She has no rales. She exhibits no tenderness.  Abdominal: Soft. Bowel sounds are normal. She exhibits no distension and no mass. There is no tenderness. There is no rebound and no guarding.  Musculoskeletal: Normal range of motion. She exhibits no edema and no tenderness.  Lymphadenopathy:    She has no cervical adenopathy.  Neurological: She is oriented to person, place, and time.  Skin: Skin is warm and dry. No rash noted. She is not diaphoretic. No erythema. No pallor.  Psychiatric: She has a  normal mood and affect. Her behavior is normal. Judgment and thought content normal.  Lab Results  Component Value Date   WBC 8.2 04/29/2011   HGB 12.8 04/29/2011   HCT 38.4 04/29/2011   PLT 372.0 04/29/2011   GLUCOSE 97 05/06/2011   ALT 16 05/06/2011   AST 16 05/06/2011   NA 137 05/06/2011   K 3.8 05/06/2011   CL 102 05/06/2011   CREATININE 0.8 05/06/2011   BUN 8 05/06/2011   CO2 28 05/06/2011   TSH 2.23 12/10/2010      Assessment & Plan:

## 2011-08-04 NOTE — Patient Instructions (Signed)
Allergic Rhinitis  Allergic rhinitis is when the mucous membranes in the nose respond to allergens. Allergens are particles in the air that cause your body to have an allergic reaction. This causes you to release allergic antibodies. Through a chain of events, these eventually cause you to release histamine into the blood stream (hence the use of antihistamines). Although meant to be protective to the body, it is this release that causes your discomfort, such as frequent sneezing, congestion and an itchy runny nose.    CAUSES    The pollen allergens may come from grasses, trees, and weeds. This is seasonal allergic rhinitis, or "hay fever." Other allergens cause year-round allergic rhinitis (perennial allergic rhinitis) such as house dust mite allergen, pet dander and mold spores.    SYMPTOMS     Nasal stuffiness (congestion).   Runny, itchy nose with sneezing and tearing of the eyes.   There is often an itching of the mouth, eyes and ears.  It cannot be cured, but it can be controlled with medications.  DIAGNOSIS    If you are unable to determine the offending allergen, skin or blood testing may find it.  TREATMENT     Avoid the allergen.   Medications and allergy shots (immunotherapy) can help.   Hay fever may often be treated with antihistamines in pill or nasal spray forms. Antihistamines block the effects of histamine. There are over-the-counter medicines that may help with nasal congestion and swelling around the eyes. Check with your caregiver before taking or giving this medicine.  If the treatment above does not work, there are many new medications your caregiver can prescribe. Stronger medications may be used if initial measures are ineffective. Desensitizing injections can be used if medications and avoidance fails. Desensitization is when a patient is given ongoing shots until the body becomes less sensitive to the allergen. Make sure you follow up with your caregiver if problems continue.  SEEK  MEDICAL CARE IF:     You develop fever (more than 100.5 F (38.1 C).   You develop a cough that does not stop easily (persistent).   You have shortness of breath.   You start wheezing.   Symptoms interfere with normal daily activities.  Document Released: 01/25/2001 Document Revised: 04/21/2011 Document Reviewed: 08/06/2008  [employer] Patient Information 2012 [employer].

## 2011-08-05 ENCOUNTER — Encounter: Payer: Self-pay | Admitting: Internal Medicine

## 2011-08-05 NOTE — Assessment & Plan Note (Signed)
ENT referral

## 2011-08-05 NOTE — Assessment & Plan Note (Signed)
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 Rhea Belton in f/up next week

## 2011-08-05 NOTE — Assessment & Plan Note (Signed)
Start nasonex ns 

## 2011-08-08 ENCOUNTER — Ambulatory Visit (INDEPENDENT_AMBULATORY_CARE_PROVIDER_SITE_OTHER): Payer: Commercial Managed Care - PPO | Admitting: Internal Medicine

## 2011-08-08 ENCOUNTER — Encounter: Payer: Self-pay | Admitting: Internal Medicine

## 2011-08-08 VITALS — BP 110/70 | HR 88 | Ht 59.0 in | Wt 171.4 lb

## 2011-08-08 DIAGNOSIS — R51 Headache: Secondary | ICD-10-CM

## 2011-08-08 DIAGNOSIS — R110 Nausea: Secondary | ICD-10-CM

## 2011-08-08 MED ORDER — ONDANSETRON HCL 4 MG PO TABS: 4.0000 mg | ORAL_TABLET | Freq: Three times a day (TID) | ORAL | Status: DC | PRN

## 2011-08-08 MED ORDER — ESOMEPRAZOLE MAGNESIUM 40 MG PO PACK: 40.0000 mg | PACK | Freq: Every day | ORAL | Status: DC

## 2011-08-08 NOTE — Patient Instructions (Addendum)
You have been schedule for and MRI of the brain on 08/15/2011 @ 9:00am at [facility] on [address]. Please arrive 15 minutes prior to your appointment.  If you need to reschedule please call [phone]  Dr. Rhea Belton would like you to continue taking your Nexium and Zofran as directed. Please call the office if symptoms get worse.  Follow up in 3 months

## 2011-08-08 NOTE — Progress Notes (Signed)
  Subjective:    Patient ID: Patty Stanley, female    DOB: [birth year], 31 y.o.   MRN: [id number]  HPI Patty Stanley is a 31 year old female with a past medical history of nausea and vomiting, low back pain, asthma, and depression who seen in followup for her nausea and vomiting. She underwent an upper endoscopy on 07/11/2011 which revealed mild antral gastritis found to be H. pylori negative and a fundic gland polyp. She subsequently underwent a gastric emptying study which was normal. She returns today reporting daily nausea, but less frequent vomiting. She recalls vomiting last 3 weeks ago. She feels overall her symptoms are better with the combination of Nexium and Zofran. She is currently taking Nexium 40 mg daily and Zofran 4 mg twice a day. She continues to deny abdominal pain. No change in her bowel habits. No blood in her stools or melena. No hematemesis. No fevers or chills. She does have daily headaches which are occipital in nature. No weakness, numbness, dizziness. No falls.  Review of Systems As per history of present illness, otherwise negative  Patient Active Problem List  Diagnoses  . DEPRESSION  . ALLERGIC RHINITIS  . ASTHMA  . OSTEOARTHRITIS  . Low back pain  . Nausea & vomiting  . Hearing loss of both ears   Current Medications, Allergies, Past Medical History, Past Surgical History, Family History and Social History were reviewed in [employer] record.     Objective:   Physical Exam BP 110/70  Pulse 88  Ht 4\' 11"  (1.499 m)  Wt 171 lb 6 oz (77.735 kg)  BMI 34.61 kg/m2  LMP 08/08/2011 Constitutional: Well-developed and well-nourished. No distress. HEENT: Normocephalic and atraumatic. Oropharynx is clear and moist. No oropharyngeal exudate. Conjunctivae are normal. Pupils are equal round and reactive to light. No scleral icterus. Cardiovascular: Normal rate, regular rhythm and intact distal pulses. No M/R/G Pulmonary/chest: Effort normal and  breath sounds normal. No wheezing, rales or rhonchi. Abdominal: Soft, obese, nontender, nondistended. Bowel sounds active throughout. There are no masses palpable. No hepatosplenomegaly. Extremities: no clubbing, cyanosis, or edema Neurological: Alert and oriented to person place and time. Skin: Skin is warm and dry. No rashes noted. Psychiatric: Normal mood and affect. Behavior is normal.  Imaging -- gastric imaging study review EGD and pathology reviewed  TSH - normal    Assessment & Plan:  31 year old female with a past medical history of nausea and vomiting, low back pain, asthma, and depression who seen in followup for her nausea and vomiting.  1. N/V -- overall her symptoms, particularly her vomiting, are better with daily PPI and ondansetron therapy. Her symptoms do relate to headache, and therefore I think brain imaging to exclude underlying pathology such as pseudotumor cerebri is reasonable. We discussed this, and I have ordered MRI brain. She can continue with the daily Nexium therapy, and when necessary ondansetron. There may be a component of cyclical nausea and vomiting, but I would like to exclude central pathology first. Should her symptoms become intractable, we could try another antibiotic, or consider referring her to the nausea and vomiting clinic at [facility]. She is happy with this plan. I will see her in 3 months time.

## 2011-08-09 ENCOUNTER — Ambulatory Visit: Payer: Commercial Managed Care - PPO | Admitting: Professional

## 2011-08-15 ENCOUNTER — Ambulatory Visit
Admission: RE | Admit: 2011-08-15 | Discharge: 2011-08-15 | Disposition: A | Discharge status: Home / Self Care | Payer: Commercial Managed Care - PPO | Source: Ambulatory Visit | Attending: Internal Medicine | Admitting: Internal Medicine

## 2011-08-15 DIAGNOSIS — R110 Nausea: Secondary | ICD-10-CM

## 2011-08-15 MED ORDER — GADOBENATE DIMEGLUMINE 529 MG/ML IV SOLN
16.0000 mL | Freq: Once | INTRAVENOUS | Status: AC | PRN
  Administered 2011-08-15: 16 mL via INTRAVENOUS

## 2011-08-16 ENCOUNTER — Ambulatory Visit: Payer: Commercial Managed Care - PPO | Admitting: Professional

## 2011-10-14 ENCOUNTER — Ambulatory Visit (INDEPENDENT_AMBULATORY_CARE_PROVIDER_SITE_OTHER): Payer: Commercial Managed Care - PPO | Admitting: Internal Medicine

## 2011-10-14 ENCOUNTER — Encounter: Payer: Self-pay | Admitting: Internal Medicine

## 2011-10-14 VITALS — BP 130/88 | HR 76 | Temp 98.3°F | Resp 16 | Wt 171.0 lb

## 2011-10-14 DIAGNOSIS — K219 Gastro-esophageal reflux disease without esophagitis: Secondary | ICD-10-CM | POA: Insufficient documentation

## 2011-10-14 DIAGNOSIS — J45909 Unspecified asthma, uncomplicated: Secondary | ICD-10-CM

## 2011-10-14 NOTE — Patient Instructions (Signed)

## 2011-10-14 NOTE — Assessment & Plan Note (Signed)
This is well-controlled.

## 2011-10-14 NOTE — Progress Notes (Signed)
  Subjective:    Patient ID: Patty Stanley, female    DOB: 1981-03-02, 31 y.o.   MRN: [id number]  Gastrophageal Reflux She reports no abdominal pain, no belching, no chest pain, no choking, no coughing, no dysphagia, no early satiety, no globus sensation, no heartburn, no hoarse voice, no nausea, no sore throat, no stridor, no tooth decay, no water brash or no wheezing. This is a chronic problem. The current episode started more than 1 year ago. The problem occurs rarely. The problem has been unchanged. The symptoms are aggravated by nothing. Pertinent negatives include no anemia, fatigue, melena, muscle weakness, orthopnea or weight loss. She has tried a PPI for the symptoms. The treatment provided significant relief.      Review of Systems  Constitutional: Negative for fever, chills, weight loss, diaphoresis, activity change, appetite change, fatigue and unexpected weight change.  HENT: Negative.  Negative for sore throat and hoarse voice.   Eyes: Negative.   Respiratory: Negative for apnea, cough, choking, chest tightness, shortness of breath, wheezing and stridor.   Cardiovascular: Negative for chest pain, palpitations and leg swelling.  Gastrointestinal: Negative for heartburn, dysphagia, nausea, vomiting, abdominal pain, diarrhea, constipation, blood in stool, melena, abdominal distention, anal bleeding and rectal pain.  Genitourinary: Negative.   Musculoskeletal: Negative for myalgias, back pain, joint swelling, arthralgias, gait problem and muscle weakness.  Skin: Negative.   Neurological: Negative.   Hematological: Negative for adenopathy. Does not bruise/bleed easily.  Psychiatric/Behavioral: Negative.        Objective:   Physical Exam  Vitals reviewed. Constitutional: She is oriented to person, place, and time. She appears well-developed and well-nourished. No distress.  HENT:  Head: Normocephalic and atraumatic.  Mouth/Throat: Oropharynx is clear and moist. No oropharyngeal  exudate.  Eyes: Conjunctivae are normal. Right eye exhibits no discharge. Left eye exhibits no discharge. No scleral icterus.  Neck: Normal range of motion. Neck supple. No JVD present. No tracheal deviation present. No thyromegaly present.  Cardiovascular: Normal rate, regular rhythm, normal heart sounds and intact distal pulses.  Exam reveals no gallop and no friction rub.   No murmur heard. Pulmonary/Chest: Effort normal and breath sounds normal. No stridor. No respiratory distress. She has no wheezes. She has no rales. She exhibits no tenderness.  Abdominal: Soft. Bowel sounds are normal. She exhibits no distension and no mass. There is no tenderness. There is no rebound and no guarding.  Musculoskeletal: Normal range of motion. She exhibits no edema and no tenderness.  Lymphadenopathy:    She has no cervical adenopathy.  Neurological: She is oriented to person, place, and time.  Skin: Skin is warm and dry. No rash noted. She is not diaphoretic. No erythema. No pallor.  Psychiatric: She has a normal mood and affect. Her behavior is normal. Judgment and thought content normal.      Lab Results  Component Value Date   WBC 8.2 04/29/2011   HGB 12.8 04/29/2011   HCT 38.4 04/29/2011   PLT 372.0 04/29/2011   GLUCOSE 97 05/06/2011   ALT 16 05/06/2011   AST 16 05/06/2011   NA 137 05/06/2011   K 3.8 05/06/2011   CL 102 05/06/2011   CREATININE 0.8 05/06/2011   BUN 8 05/06/2011   CO2 28 05/06/2011   TSH 2.23 12/10/2010      Assessment & Plan:

## 2011-10-14 NOTE — Assessment & Plan Note (Signed)
She wants to change nexium to otc zantac

## 2012-11-29 ENCOUNTER — Encounter: Payer: Self-pay | Admitting: Internal Medicine

## 2012-11-29 ENCOUNTER — Other Ambulatory Visit (INDEPENDENT_AMBULATORY_CARE_PROVIDER_SITE_OTHER): Payer: Self-pay

## 2012-11-29 ENCOUNTER — Ambulatory Visit (INDEPENDENT_AMBULATORY_CARE_PROVIDER_SITE_OTHER): Payer: Self-pay | Admitting: Internal Medicine

## 2012-11-29 VITALS — BP 122/80 | HR 80 | Temp 98.1°F | Resp 16 | Ht 59.0 in | Wt 171.4 lb

## 2012-11-29 DIAGNOSIS — Z23 Encounter for immunization: Secondary | ICD-10-CM

## 2012-11-29 DIAGNOSIS — F341 Dysthymic disorder: Secondary | ICD-10-CM

## 2012-11-29 DIAGNOSIS — G2581 Restless legs syndrome: Secondary | ICD-10-CM

## 2012-11-29 DIAGNOSIS — J45909 Unspecified asthma, uncomplicated: Secondary | ICD-10-CM

## 2012-11-29 DIAGNOSIS — E669 Obesity, unspecified: Secondary | ICD-10-CM

## 2012-11-29 DIAGNOSIS — Z0000 Encounter for general adult medical examination without abnormal findings: Secondary | ICD-10-CM

## 2012-11-29 DIAGNOSIS — F418 Other specified anxiety disorders: Secondary | ICD-10-CM

## 2012-11-29 DIAGNOSIS — E66811 Obesity, class 1: Secondary | ICD-10-CM

## 2012-11-29 LAB — CBC WITH DIFFERENTIAL/PLATELET
Basophils Relative: 0.9 % (ref 0.0–3.0)
Eosinophils Absolute: 0.2 10*3/uL (ref 0.0–0.7)
Eosinophils Relative: 2.5 % (ref 0.0–5.0)
HCT: 37.9 % (ref 36.0–46.0)
Lymphs Abs: 2.3 10*3/uL (ref 0.7–4.0)
MCHC: 33.3 g/dL (ref 30.0–36.0)
MCV: 86.1 fl (ref 78.0–100.0)
Monocytes Absolute: 0.6 10*3/uL (ref 0.1–1.0)
Neutrophils Relative %: 52.9 % (ref 43.0–77.0)
Platelets: 416 10*3/uL — ABNORMAL HIGH (ref 150.0–400.0)
RBC: 4.4 Mil/uL (ref 3.87–5.11)
WBC: 6.6 10*3/uL (ref 4.5–10.5)

## 2012-11-29 LAB — COMPREHENSIVE METABOLIC PANEL
AST: 18 U/L (ref 0–37)
Albumin: 3.8 g/dL (ref 3.5–5.2)
Alkaline Phosphatase: 106 U/L (ref 39–117)
Potassium: 4 mEq/L (ref 3.5–5.1)
Sodium: 138 mEq/L (ref 135–145)
Total Bilirubin: 0.4 mg/dL (ref 0.3–1.2)
Total Protein: 7.5 g/dL (ref 6.0–8.3)

## 2012-11-29 LAB — LIPID PANEL
HDL: 49.2 mg/dL (ref >39.00)
LDL Cholesterol: 95 mg/dL (ref 0–99)
Total CHOL/HDL Ratio: 3
Triglycerides: 113 mg/dL (ref 0.0–149.0)
VLDL: 22.6 mg/dL (ref 0.0–40.0)

## 2012-11-29 LAB — TSH: TSH: 1.12 u[IU]/mL (ref 0.35–5.50)

## 2012-11-29 MED ORDER — CLONAZEPAM 1 MG PO TABS: 1.0000 mg | ORAL_TABLET | Freq: Two times a day (BID) | ORAL | Status: DC | PRN

## 2012-11-29 MED ORDER — BECLOMETHASONE DIPROPIONATE 80 MCG/ACT IN AERS: 1.0000 | INHALATION_SPRAY | Freq: Two times a day (BID) | RESPIRATORY_TRACT | Status: DC

## 2012-11-29 NOTE — Assessment & Plan Note (Signed)
Klonopin should help this

## 2012-11-29 NOTE — Assessment & Plan Note (Signed)
Continue effexor We talked about BZD therapy and the risks of dependence, addiction, tolerance, side effects. She feels miserable and wants to continue taking a BZD so I recommended klonopin.

## 2012-11-29 NOTE — Assessment & Plan Note (Signed)
Exam done Vaccines were reviewed and updated Labs ordered Pt ed material was given 

## 2012-11-29 NOTE — Assessment & Plan Note (Signed)
She will work on her lifestyle modifications 

## 2012-11-29 NOTE — Progress Notes (Signed)
Subjective:    Patient ID: Patty Stanley, female    DOB: March 29, 1981, 32 y.o.   MRN: [id number]  Anxiety Presents for follow-up visit. Symptoms include depressed mood, excessive worry, insomnia, irritability, nervous/anxious behavior and panic. Patient reports no chest pain, compulsions, confusion, decreased concentration, dizziness, dry mouth, feeling of choking, hyperventilation, malaise, muscle tension, nausea, obsessions, palpitations, restlessness, shortness of breath or suicidal ideas. Symptoms occur most days. The severity of symptoms is mild. The quality of sleep is poor (she has frequent leg and feet movements at night). Nighttime awakenings: several.   Compliance with medications: her dentist gave her som valium and that has helped.      Review of Systems  Constitutional: Positive for irritability. Negative for fever, chills, diaphoresis, activity change, appetite change, fatigue and unexpected weight change.  HENT: Negative.   Eyes: Negative.   Respiratory: Negative.  Negative for cough, choking, chest tightness, shortness of breath, wheezing and stridor.   Cardiovascular: Negative.  Negative for chest pain, palpitations and leg swelling.  Gastrointestinal: Negative.  Negative for nausea, vomiting, abdominal pain, diarrhea, constipation and blood in stool.  Endocrine: Negative.   Genitourinary: Negative.   Musculoskeletal: Negative.   Skin: Negative.   Allergic/Immunologic: Negative.   Neurological: Negative.  Negative for dizziness, tremors, weakness, light-headedness and headaches.  Hematological: Negative.  Negative for adenopathy. Does not bruise/bleed easily.  Psychiatric/Behavioral: Positive for sleep disturbance and dysphoric mood (irritable). Negative for suicidal ideas, hallucinations, behavioral problems, confusion, self-injury, decreased concentration and agitation. The patient is nervous/anxious and has insomnia. The patient is not hyperactive.        Objective:   Physical Exam  Vitals reviewed. Constitutional: She is oriented to person, place, and time. She appears well-developed and well-nourished. No distress.  HENT:  Head: Normocephalic and atraumatic.  Mouth/Throat: Oropharynx is clear and moist. No oropharyngeal exudate.  Eyes: Conjunctivae are normal. Right eye exhibits no discharge. Left eye exhibits no discharge. No scleral icterus.  Neck: Normal range of motion. Neck supple. No JVD present. No tracheal deviation present. No thyromegaly present.  Cardiovascular: Normal rate, regular rhythm, normal heart sounds and intact distal pulses.  Exam reveals no gallop and no friction rub.   No murmur heard. Pulmonary/Chest: Effort normal and breath sounds normal. No stridor. No respiratory distress. She has no wheezes. She has no rales. She exhibits no tenderness.  Abdominal: Soft. Bowel sounds are normal. She exhibits no distension and no mass. There is no tenderness. There is no rebound and no guarding.  Musculoskeletal: Normal range of motion. She exhibits no edema and no tenderness.  Lymphadenopathy:    She has no cervical adenopathy.  Neurological: She is oriented to person, place, and time.  Skin: Skin is warm and dry. No rash noted. She is not diaphoretic. No erythema. No pallor.  Psychiatric: Her speech is normal and behavior is normal. Judgment and thought content normal. Her mood appears anxious. Her affect is not angry, not blunt, not labile and not inappropriate. She is not slowed, not withdrawn and not actively hallucinating. Cognition and memory are normal. She exhibits a depressed mood. She is attentive.     Lab Results  Component Value Date   WBC 8.2 04/29/2011   HGB 12.8 04/29/2011   HCT 38.4 04/29/2011   PLT 372.0 04/29/2011   GLUCOSE 97 05/06/2011   ALT 16 05/06/2011   AST 16 05/06/2011   NA 137 05/06/2011   K 3.8 05/06/2011   CL 102 05/06/2011   CREATININE 0.8 05/06/2011  BUN 8 05/06/2011   CO2 28 05/06/2011   TSH 2.23  12/10/2010       Assessment & Plan:

## 2012-11-29 NOTE — Patient Instructions (Signed)
Preventive Care for Adults, Female A healthy lifestyle and preventive care can promote health and wellness. Preventive health guidelines for women include the following key practices.  A routine yearly physical is a good way to check with your caregiver about your health and preventive screening. It is a chance to share any concerns and updates on your health, and to receive a thorough exam.  Visit your dentist for a routine exam and preventive care every 6 months. Brush your teeth twice a day and floss once a day. Good oral hygiene prevents tooth decay and gum disease.  The frequency of eye exams is based on your age, health, family medical history, use of contact lenses, and other factors. Follow your caregiver's recommendations for frequency of eye exams.  Eat a healthy diet. Foods like vegetables, fruits, whole grains, low-fat dairy products, and lean protein foods contain the nutrients you need without too many calories. Decrease your intake of foods high in solid fats, added sugars, and salt. Eat the right amount of calories for you.Get information about a proper diet from your caregiver, if necessary.  Regular physical exercise is one of the most important things you can do for your health. Most adults should get at least 150 minutes of moderate-intensity exercise (any activity that increases your heart rate and causes you to sweat) each week. In addition, most adults need muscle-strengthening exercises on 2 or more days a week.  Maintain a healthy weight. The body mass index (BMI) is a screening tool to identify possible weight problems. It provides an estimate of body fat based on height and weight. Your caregiver can help determine your BMI, and can help you achieve or maintain a healthy weight.For adults 20 years and older:  A BMI below 18.5 is considered underweight.  A BMI of 18.5 to 24.9 is normal.  A BMI of 25 to 29.9 is considered overweight.  A BMI of 30 and above is  considered obese.  Maintain normal blood lipids and cholesterol levels by exercising and minimizing your intake of saturated fat. Eat a balanced diet with plenty of fruit and vegetables. Blood tests for lipids and cholesterol should begin at age 20 and be repeated every 5 years. If your lipid or cholesterol levels are high, you are over 50, or you are at high risk for heart disease, you may need your cholesterol levels checked more frequently.Ongoing high lipid and cholesterol levels should be treated with medicines if diet and exercise are not effective.  If you smoke, find out from your caregiver how to quit. If you do not use tobacco, do not start.  If you are pregnant, do not drink alcohol. If you are breastfeeding, be very cautious about drinking alcohol. If you are not pregnant and choose to drink alcohol, do not exceed 1 drink per day. One drink is considered to be 12 ounces (355 mL) of beer, 5 ounces (148 mL) of wine, or 1.5 ounces (44 mL) of liquor.  Avoid use of street drugs. Do not share needles with anyone. Ask for help if you need support or instructions about stopping the use of drugs.  High blood pressure causes heart disease and increases the risk of stroke. Your blood pressure should be checked at least every 1 to 2 years. Ongoing high blood pressure should be treated with medicines if weight loss and exercise are not effective.  If you are 55 to 34 years old, ask your caregiver if you should take aspirin to prevent strokes.  Diabetes   screening involves taking a blood sample to check your fasting blood sugar level. This should be done once every 3 years, after age 45, if you are within normal weight and without risk factors for diabetes. Testing should be considered at a younger age or be carried out more frequently if you are overweight and have at least 1 risk factor for diabetes.  Breast cancer screening is essential preventive care for women. You should practice "breast  self-awareness." This means understanding the normal appearance and feel of your breasts and may include breast self-examination. Any changes detected, no matter how small, should be reported to a caregiver. Women in their 20s and 30s should have a clinical breast exam (CBE) by a caregiver as part of a regular health exam every 1 to 3 years. After age 40, women should have a CBE every year. Starting at age 40, women should consider having a mammography (breast X-ray test) every year. Women who have a family history of breast cancer should talk to their caregiver about genetic screening. Women at a high risk of breast cancer should talk to their caregivers about having magnetic resonance imaging (MRI) and a mammography every year.  The Pap test is a screening test for cervical cancer. A Pap test can show cell changes on the cervix that might become cervical cancer if left untreated. A Pap test is a procedure in which cells are obtained and examined from the lower end of the uterus (cervix).  Women should have a Pap test starting at age 21.  Between ages 21 and 29, Pap tests should be repeated every 2 years.  Beginning at age 30, you should have a Pap test every 3 years as long as the past 3 Pap tests have been normal.  Some women have medical problems that increase the chance of getting cervical cancer. Talk to your caregiver about these problems. It is especially important to talk to your caregiver if a new problem develops soon after your last Pap test. In these cases, your caregiver may recommend more frequent screening and Pap tests.  The above recommendations are the same for women who have or have not gotten the vaccine for human papillomavirus (HPV).  If you had a hysterectomy for a problem that was not cancer or a condition that could lead to cancer, then you no longer need Pap tests. Even if you no longer need a Pap test, a regular exam is a good idea to make sure no other problems are  starting.  If you are between ages 65 and 70, and you have had normal Pap tests going back 10 years, you no longer need Pap tests. Even if you no longer need a Pap test, a regular exam is a good idea to make sure no other problems are starting.  If you have had past treatment for cervical cancer or a condition that could lead to cancer, you need Pap tests and screening for cancer for at least 20 years after your treatment.  If Pap tests have been discontinued, risk factors (such as a new sexual partner) need to be reassessed to determine if screening should be resumed.  The HPV test is an additional test that may be used for cervical cancer screening. The HPV test looks for the virus that can cause the cell changes on the cervix. The cells collected during the Pap test can be tested for HPV. The HPV test could be used to screen women aged 30 years and older, and should   be used in women of any age who have unclear Pap test results. After the age of 30, women should have HPV testing at the same frequency as a Pap test.  Colorectal cancer can be detected and often prevented. Most routine colorectal cancer screening begins at the age of 50 and continues through age 75. However, your caregiver may recommend screening at an earlier age if you have risk factors for colon cancer. On a yearly basis, your caregiver may provide home test kits to check for hidden blood in the stool. Use of a small camera at the end of a tube, to directly examine the colon (sigmoidoscopy or colonoscopy), can detect the earliest forms of colorectal cancer. Talk to your caregiver about this at age 50, when routine screening begins. Direct examination of the colon should be repeated every 5 to 10 years through age 75, unless early forms of pre-cancerous polyps or small growths are found.  Hepatitis C blood testing is recommended for all people born from 1945 through 1965 and any individual with known risks for hepatitis C.  Practice  safe sex. Use condoms and avoid high-risk sexual practices to reduce the spread of sexually transmitted infections (STIs). STIs include gonorrhea, chlamydia, syphilis, trichomonas, herpes, HPV, and human immunodeficiency virus (HIV). Herpes, HIV, and HPV are viral illnesses that have no cure. They can result in disability, cancer, and death. Sexually active women aged 25 and younger should be checked for chlamydia. Older women with new or multiple partners should also be tested for chlamydia. Testing for other STIs is recommended if you are sexually active and at increased risk.  Osteoporosis is a disease in which the bones lose minerals and strength with aging. This can result in serious bone fractures. The risk of osteoporosis can be identified using a bone density scan. Women ages 65 and over and women at risk for fractures or osteoporosis should discuss screening with their caregivers. Ask your caregiver whether you should take a calcium supplement or vitamin D to reduce the rate of osteoporosis.  Menopause can be associated with physical symptoms and risks. Hormone replacement therapy is available to decrease symptoms and risks. You should talk to your caregiver about whether hormone replacement therapy is right for you.  Use sunscreen with sun protection factor (SPF) of 30 or more. Apply sunscreen liberally and repeatedly throughout the day. You should seek shade when your shadow is shorter than you. Protect yourself by wearing long sleeves, pants, a wide-brimmed hat, and sunglasses year round, whenever you are outdoors.  Once a month, do a whole body skin exam, using a mirror to look at the skin on your back. Notify your caregiver of new moles, moles that have irregular borders, moles that are larger than a pencil eraser, or moles that have changed in shape or color.  Stay current with required immunizations.  Influenza. You need a dose every fall (or winter). The composition of the flu vaccine  changes each year, so being vaccinated once is not enough.  Pneumococcal polysaccharide. You need 1 to 2 doses if you smoke cigarettes or if you have certain chronic medical conditions. You need 1 dose at age 65 (or older) if you have never been vaccinated.  Tetanus, diphtheria, pertussis (Tdap, Td). Get 1 dose of Tdap vaccine if you are younger than age 65, are over 65 and have contact with an infant, are a healthcare worker, are pregnant, or simply want to be protected from whooping cough. After that, you need a Td   booster dose every 10 years. Consult your caregiver if you have not had at least 3 tetanus and diphtheria-containing shots sometime in your life or have a deep or dirty wound.  HPV. You need this vaccine if you are a woman age 26 or younger. The vaccine is given in 3 doses over 6 months.  Measles, mumps, rubella (MMR). You need at least 1 dose of MMR if you were born in 1957 or later. You may also need a second dose.  Meningococcal. If you are age 19 to 21 and a first-year college student living in a residence hall, or have one of several medical conditions, you need to get vaccinated against meningococcal disease. You may also need additional booster doses.  Zoster (shingles). If you are age 60 or older, you should get this vaccine.  Varicella (chickenpox). If you have never had chickenpox or you were vaccinated but received only 1 dose, talk to your caregiver to find out if you need this vaccine.  Hepatitis A. You need this vaccine if you have a specific risk factor for hepatitis A virus infection or you simply wish to be protected from this disease. The vaccine is usually given as 2 doses, 6 to 18 months apart.  Hepatitis B. You need this vaccine if you have a specific risk factor for hepatitis B virus infection or you simply wish to be protected from this disease. The vaccine is given in 3 doses, usually over 6 months. Preventive Services / Frequency Ages 19 to 39  Blood  pressure check.** / Every 1 to 2 years.  Lipid and cholesterol check.** / Every 5 years beginning at age 20.  Clinical breast exam.** / Every 3 years for women in their 20s and 30s.  Pap test.** / Every 2 years from ages 21 through 29. Every 3 years starting at age 30 through age 65 or 70 with a history of 3 consecutive normal Pap tests.  HPV screening.** / Every 3 years from ages 30 through ages 65 to 70 with a history of 3 consecutive normal Pap tests.  Hepatitis C blood test.** / For any individual with known risks for hepatitis C.  Skin self-exam. / Monthly.  Influenza immunization.** / Every year.  Pneumococcal polysaccharide immunization.** / 1 to 2 doses if you smoke cigarettes or if you have certain chronic medical conditions.  Tetanus, diphtheria, pertussis (Tdap, Td) immunization. / A one-time dose of Tdap vaccine. After that, you need a Td booster dose every 10 years.  HPV immunization. / 3 doses over 6 months, if you are 26 and younger.  Measles, mumps, rubella (MMR) immunization. / You need at least 1 dose of MMR if you were born in 1957 or later. You may also need a second dose.  Meningococcal immunization. / 1 dose if you are age 19 to 21 and a first-year college student living in a residence hall, or have one of several medical conditions, you need to get vaccinated against meningococcal disease. You may also need additional booster doses.  Varicella immunization.** / Consult your caregiver.  Hepatitis A immunization.** / Consult your caregiver. 2 doses, 6 to 18 months apart.  Hepatitis B immunization.** / Consult your caregiver. 3 doses usually over 6 months. Ages 40 to 64  Blood pressure check.** / Every 1 to 2 years.  Lipid and cholesterol check.** / Every 5 years beginning at age 20.  Clinical breast exam.** / Every year after age 40.  Mammogram.** / Every year beginning at age 40   and continuing for as long as you are in good health. Consult with your  caregiver.  Pap test.** / Every 3 years starting at age 30 through age 65 or 70 with a history of 3 consecutive normal Pap tests.  HPV screening.** / Every 3 years from ages 30 through ages 65 to 70 with a history of 3 consecutive normal Pap tests.  Fecal occult blood test (FOBT) of stool. / Every year beginning at age 50 and continuing until age 75. You may not need to do this test if you get a colonoscopy every 10 years.  Flexible sigmoidoscopy or colonoscopy.** / Every 5 years for a flexible sigmoidoscopy or every 10 years for a colonoscopy beginning at age 50 and continuing until age 75.  Hepatitis C blood test.** / For all people born from 1945 through 1965 and any individual with known risks for hepatitis C.  Skin self-exam. / Monthly.  Influenza immunization.** / Every year.  Pneumococcal polysaccharide immunization.** / 1 to 2 doses if you smoke cigarettes or if you have certain chronic medical conditions.  Tetanus, diphtheria, pertussis (Tdap, Td) immunization.** / A one-time dose of Tdap vaccine. After that, you need a Td booster dose every 10 years.  Measles, mumps, rubella (MMR) immunization. / You need at least 1 dose of MMR if you were born in 1957 or later. You may also need a second dose.  Varicella immunization.** / Consult your caregiver.  Meningococcal immunization.** / Consult your caregiver.  Hepatitis A immunization.** / Consult your caregiver. 2 doses, 6 to 18 months apart.  Hepatitis B immunization.** / Consult your caregiver. 3 doses, usually over 6 months. Ages 65 and over  Blood pressure check.** / Every 1 to 2 years.  Lipid and cholesterol check.** / Every 5 years beginning at age 20.  Clinical breast exam.** / Every year after age 40.  Mammogram.** / Every year beginning at age 40 and continuing for as long as you are in good health. Consult with your caregiver.  Pap test.** / Every 3 years starting at age 30 through age 65 or 70 with a 3  consecutive normal Pap tests. Testing can be stopped between 65 and 70 with 3 consecutive normal Pap tests and no abnormal Pap or HPV tests in the past 10 years.  HPV screening.** / Every 3 years from ages 30 through ages 65 or 70 with a history of 3 consecutive normal Pap tests. Testing can be stopped between 65 and 70 with 3 consecutive normal Pap tests and no abnormal Pap or HPV tests in the past 10 years.  Fecal occult blood test (FOBT) of stool. / Every year beginning at age 50 and continuing until age 75. You may not need to do this test if you get a colonoscopy every 10 years.  Flexible sigmoidoscopy or colonoscopy.** / Every 5 years for a flexible sigmoidoscopy or every 10 years for a colonoscopy beginning at age 50 and continuing until age 75.  Hepatitis C blood test.** / For all people born from 1945 through 1965 and any individual with known risks for hepatitis C.  Osteoporosis screening.** / A one-time screening for women ages 65 and over and women at risk for fractures or osteoporosis.  Skin self-exam. / Monthly.  Influenza immunization.** / Every year.  Pneumococcal polysaccharide immunization.** / 1 dose at age 65 (or older) if you have never been vaccinated.  Tetanus, diphtheria, pertussis (Tdap, Td) immunization. / A one-time dose of Tdap vaccine if you are over   65 and have contact with an infant, are a healthcare worker, or simply want to be protected from whooping cough. After that, you need a Td booster dose every 10 years.  Varicella immunization.** / Consult your caregiver.  Meningococcal immunization.** / Consult your caregiver.  Hepatitis A immunization.** / Consult your caregiver. 2 doses, 6 to 18 months apart.  Hepatitis B immunization.** / Check with your caregiver. 3 doses, usually over 6 months. ** Family history and personal history of risk and conditions may change your caregiver's recommendations. Document Released: 06/28/2001 Document Revised: 07/25/2011  Document Reviewed: 09/27/2010 [employer] Patient Information 2014 [employer].  

## 2012-12-11 ENCOUNTER — Other Ambulatory Visit: Payer: Self-pay | Admitting: Internal Medicine

## 2012-12-11 DIAGNOSIS — F418 Other specified anxiety disorders: Secondary | ICD-10-CM

## 2012-12-11 DIAGNOSIS — K219 Gastro-esophageal reflux disease without esophagitis: Secondary | ICD-10-CM

## 2012-12-11 DIAGNOSIS — J309 Allergic rhinitis, unspecified: Secondary | ICD-10-CM

## 2012-12-11 DIAGNOSIS — J4520 Mild intermittent asthma, uncomplicated: Secondary | ICD-10-CM

## 2012-12-11 DIAGNOSIS — J45909 Unspecified asthma, uncomplicated: Secondary | ICD-10-CM

## 2012-12-11 MED ORDER — DEXLANSOPRAZOLE 60 MG PO CPDR: 60.0000 mg | DELAYED_RELEASE_CAPSULE | Freq: Every day | ORAL | Status: DC

## 2012-12-11 MED ORDER — ALBUTEROL SULFATE HFA 108 (90 BASE) MCG/ACT IN AERS: 2.0000 | INHALATION_SPRAY | Freq: Four times a day (QID) | RESPIRATORY_TRACT | Status: DC | PRN

## 2012-12-11 MED ORDER — BECLOMETHASONE DIPROPIONATE 80 MCG/ACT IN AERS: 1.0000 | INHALATION_SPRAY | Freq: Two times a day (BID) | RESPIRATORY_TRACT | Status: DC

## 2012-12-11 MED ORDER — DESLORATADINE 5 MG PO TABS: 5.0000 mg | ORAL_TABLET | Freq: Every day | ORAL | Status: DC

## 2012-12-11 MED ORDER — VENLAFAXINE HCL ER 150 MG PO CP24: 150.0000 mg | ORAL_CAPSULE | Freq: Every day | ORAL | Status: DC

## 2012-12-11 MED ORDER — MOMETASONE FUROATE 50 MCG/ACT NA SUSP: 24.0000 | Freq: Every day | NASAL | Status: DC

## 2013-02-10 ENCOUNTER — Encounter (HOSPITAL_COMMUNITY): Payer: Self-pay

## 2013-02-10 ENCOUNTER — Emergency Department (HOSPITAL_COMMUNITY)
Admission: EM | Admit: 2013-02-10 | Discharge: 2013-02-10 | Disposition: A | Discharge status: Home / Self Care | Payer: Self-pay | Attending: Emergency Medicine | Admitting: Emergency Medicine

## 2013-02-10 DIAGNOSIS — Y9289 Other specified places as the place of occurrence of the external cause: Secondary | ICD-10-CM | POA: Insufficient documentation

## 2013-02-10 DIAGNOSIS — F3289 Other specified depressive episodes: Secondary | ICD-10-CM | POA: Insufficient documentation

## 2013-02-10 DIAGNOSIS — S0180XA Unspecified open wound of other part of head, initial encounter: Secondary | ICD-10-CM | POA: Insufficient documentation

## 2013-02-10 DIAGNOSIS — Y9389 Activity, other specified: Secondary | ICD-10-CM | POA: Insufficient documentation

## 2013-02-10 DIAGNOSIS — F329 Major depressive disorder, single episode, unspecified: Secondary | ICD-10-CM | POA: Insufficient documentation

## 2013-02-10 DIAGNOSIS — Z880 Allergy status to penicillin: Secondary | ICD-10-CM | POA: Insufficient documentation

## 2013-02-10 DIAGNOSIS — J45909 Unspecified asthma, uncomplicated: Secondary | ICD-10-CM | POA: Insufficient documentation

## 2013-02-10 DIAGNOSIS — W64XXXA Exposure to other animate mechanical forces, initial encounter: Secondary | ICD-10-CM | POA: Insufficient documentation

## 2013-02-10 DIAGNOSIS — F411 Generalized anxiety disorder: Secondary | ICD-10-CM | POA: Insufficient documentation

## 2013-02-10 DIAGNOSIS — M1990 Unspecified osteoarthritis, unspecified site: Secondary | ICD-10-CM | POA: Insufficient documentation

## 2013-02-10 DIAGNOSIS — Z792 Long term (current) use of antibiotics: Secondary | ICD-10-CM | POA: Insufficient documentation

## 2013-02-10 DIAGNOSIS — IMO0002 Reserved for concepts with insufficient information to code with codable children: Secondary | ICD-10-CM | POA: Insufficient documentation

## 2013-02-10 DIAGNOSIS — Z79899 Other long term (current) drug therapy: Secondary | ICD-10-CM | POA: Insufficient documentation

## 2013-02-10 HISTORY — DX: Anxiety disorder, unspecified: F41.9

## 2013-02-10 MED ORDER — CIPROFLOXACIN HCL 500 MG PO TABS: 500.0000 mg | ORAL_TABLET | Freq: Two times a day (BID) | ORAL | Status: DC

## 2013-02-10 NOTE — ED Provider Notes (Signed)
CSN: [id number]     Arrival date & time 02/10/13  2019 History  This chart was scribed for non-physician practitioner Ivonne Andrew, working with Nelia Shi, MD by Carl Best, ED Scribe. This patient was seen in room WTR8/WTR8 and the patient's care was started at 9:36 PM.     Chief Complaint  Patient presents with  . cat scratch     The history is provided by the patient. No language interpreter was used.   HPI Comments: Patty Stanley is a 32 y.o. female who presents to the Emergency Department complaining of a laceration over her right eye and the left side of her face that occurred today after she was scratched by a cat.  The patient states that the scratch was bleeding profusely but has since been controlled. The patient's friend states that the cat does not have its shots.  The patient states that she has an allergy to Penicillin.  The patient states that her TD is UTD.    Past Medical History  Diagnosis Date  . Headache(784.0)   . UNSPECIFIED TACHYCARDIA   . DEPRESSION   . ASTHMA   . OSTEOARTHRITIS   . ALLERGIC RHINITIS   . Alcohol abuse   . Anxiety    Past Surgical History  Procedure Laterality Date  . Wisdom tooth extraction     Family History  Problem Relation Age of Onset  . Adopted: Yes  . Alcohol abuse Other   . Arthritis Other    History  Substance Use Topics  . Smoking status: Never Smoker   . Smokeless tobacco: Never Used  . Alcohol Use: No   OB History   Grav Para Term Preterm Abortions TAB SAB Ect Mult Living                 Review of Systems  Skin: Positive for wound (scratch above right eye).    Allergies  Penicillins  Home Medications   Current Outpatient Rx  Name  Route  Sig  Dispense  Refill  . albuterol (PROVENTIL HFA) 108 (90 BASE) MCG/ACT inhaler   Inhalation   Inhale 2 puffs into the lungs every 6 (six) hours as needed for wheezing.   3 Inhaler   3   . beclomethasone (QVAR) 80 MCG/ACT inhaler   Inhalation   Inhale 1 puff  into the lungs 2 (two) times daily.   3 Inhaler   3   . clonazePAM (KLONOPIN) 1 MG tablet   Oral   Take 1 tablet (1 mg total) by mouth 2 (two) times daily as needed for anxiety.   60 tablet   5   . desloratadine (CLARINEX) 5 MG tablet   Oral   Take 1 tablet (5 mg total) by mouth daily.   90 tablet   3   . dexlansoprazole (DEXILANT) 60 MG capsule   Oral   Take 1 capsule (60 mg total) by mouth daily.   90 capsule   3   . mometasone (NASONEX) 50 MCG/ACT nasal spray   Nasal   Place 24 sprays into the nose daily.   51 g   3   . montelukast (SINGULAIR) 10 MG tablet   Oral   Take 1 tablet (10 mg total) by mouth daily.   90 tablet   3   . ranitidine (ZANTAC) 150 MG tablet   Oral   Take 150 mg by mouth daily.         Marland Kitchen venlafaxine XR (EFFEXOR-XR) 150 MG 24  hr capsule   Oral   Take 1 capsule (150 mg total) by mouth daily.   90 capsule   3    Triage Vitals: BP 123/89  Pulse 117  Temp(Src) 98.4 F (36.9 C) (Oral)  Resp 18  SpO2 100%  LMP 02/08/2013  Physical Exam  Nursing note and vitals reviewed. Constitutional: She is oriented to person, place, and time. She appears well-developed and well-nourished. No distress.  HENT:  Head: Normocephalic and atraumatic.  Right Ear: External ear normal.  Left Ear: External ear normal.  Nose: Nose normal.  Eyes: Conjunctivae and EOM are normal. Pupils are equal, round, and reactive to light.  Neck: Normal range of motion. Neck supple.  Cardiovascular: Normal rate, regular rhythm, normal heart sounds and intact distal pulses.   Pulmonary/Chest: Effort normal and breath sounds normal.  Neurological: She is alert and oriented to person, place, and time.  Skin: Skin is warm and dry. She is not diaphoretic.  Very small scratches versus miniature puncture wounds consistent with a cat claw to the left and right side of the face and right eyebrow.    Psychiatric: She has a normal mood and affect.    ED Course  Procedures    DIAGNOSTIC STUDIES: Oxygen Saturation is 100% on room air, normal by my interpretation.    COORDINATION OF CARE: 9:38 PM-  Advised the patient to wash the skin and pat it dry to reduce the risk of infection.  The patient agreed to the treatment plan.       MDM   1. Cat scratch, initial encounter      Patient seen and evaluated. He appears well very anxious. Does not appear in any acute distress.  I personally performed the services described in this documentation, which was scribed in my presence. The recorded information has been reviewed and is accurate.   Angus Seller, PA-C 02/11/13 [id number]

## 2013-02-10 NOTE — ED Notes (Signed)
Pt was scratched above rt eye by neighbors out door cat.  Bleeding controlled

## 2013-02-22 NOTE — ED Provider Notes (Signed)
Medical screening examination/treatment/procedure(s) were performed by non-physician practitioner and as supervising physician I was immediately available for consultation/collaboration.    Nelia Shi, MD 02/22/13 [phone]

## 2013-03-21 ENCOUNTER — Other Ambulatory Visit: Payer: Self-pay

## 2014-08-18 ENCOUNTER — Ambulatory Visit (INDEPENDENT_AMBULATORY_CARE_PROVIDER_SITE_OTHER): Payer: Worker's Compensation | Admitting: Family Medicine

## 2014-08-18 VITALS — BP 112/84 | HR 70 | Temp 98.2°F | Resp 18 | Ht 60.5 in | Wt 171.8 lb

## 2014-08-18 DIAGNOSIS — R519 Headache, unspecified: Secondary | ICD-10-CM

## 2014-08-18 DIAGNOSIS — R51 Headache: Secondary | ICD-10-CM | POA: Diagnosis not present

## 2014-08-18 DIAGNOSIS — S0093XA Contusion of unspecified part of head, initial encounter: Secondary | ICD-10-CM | POA: Diagnosis not present

## 2014-08-18 MED ORDER — ACETAMINOPHEN 500 MG PO TABS: 1000.0000 mg | ORAL_TABLET | Freq: Once | ORAL | Status: DC

## 2014-08-18 NOTE — Patient Instructions (Signed)
Rest- physical and mental.  Avoid reading, TV, computer, phone.  Listening to music is ok If you feel up to it, tomorrow try being a bit more active.  However if activity triggers headache or nausea please go back to complete rest Please see me Wednesday morning- 9 am- and we will see if you are ready to go back to work Use tylenol as needed for your headache If you have any worsening or severe symptoms please seek emergency care

## 2014-08-18 NOTE — Progress Notes (Signed)
[facility] [address], [place] [address] [phone]  Date:  08/18/2014   Name:  Patty Stanley   DOB:  1981-04-22   MRN:  [id number]  PCP:  Scarlette Calico, MD    Chief Complaint: Head Injury   History of Present Illness:  Patty Stanley is a 34 y.o. very pleasant female patient who presents with the following:  She was at work today when a glass water bottle (for an animal cage) fell from approx 6 feet high and hit her on the head while she was crouching down.  It seems that the metal holder that holds the bottle on the cage gave way.   The bottle hit her in the midline of her head.   She felt nausated right away, and a bit dizzy a few minutes later. No LOC. No confusion, no amnesia.   No meds used.   She has a headche now. She has not taken any medication for her head She is taking naproxen at ngiht currently for pain in her left hand from a recent dog bite   Patient Active Problem List   Diagnosis Date Noted  . RLS (restless legs syndrome) 11/29/2012  . Routine general medical examination at a health care facility 11/29/2012  . Obesity (BMI 30.0-34.9) 11/29/2012  . Need for pneumococcal vaccination 11/29/2012  . GERD (gastroesophageal reflux disease) 10/14/2011  . Low back pain 01/27/2011  . Depression with anxiety 04/19/2010  . ALLERGIC RHINITIS 04/19/2010  . Asthma, intermittent 04/19/2010  . OSTEOARTHRITIS 04/19/2010    Past Medical History  Diagnosis Date  . Headache(784.0)   . UNSPECIFIED TACHYCARDIA   . DEPRESSION   . ASTHMA   . OSTEOARTHRITIS   . ALLERGIC RHINITIS   . Alcohol abuse   . Anxiety     Past Surgical History  Procedure Laterality Date  . Wisdom tooth extraction      History  Substance Use Topics  . Smoking status: Never Smoker   . Smokeless tobacco: Never Used  . Alcohol Use: No    Family History  Problem Relation Age of Onset  . Adopted: Yes  . Alcohol abuse Other   . Arthritis Other      Allergies  Allergen Reactions  . Penicillins     REACTION: Itching    Medication list has been reviewed and updated.  Current Outpatient Prescriptions on File Prior to Visit  Medication Sig Dispense Refill  . albuterol (PROVENTIL HFA) 108 (90 BASE) MCG/ACT inhaler Inhale 2 puffs into the lungs every 6 (six) hours as needed for wheezing. 3 Inhaler 3  . beclomethasone (QVAR) 80 MCG/ACT inhaler Inhale 1 puff into the lungs 2 (two) times daily. 3 Inhaler 3  . clonazePAM (KLONOPIN) 1 MG tablet Take 1 tablet (1 mg total) by mouth 2 (two) times daily as needed for anxiety. 60 tablet 5  . montelukast (SINGULAIR) 10 MG tablet Take 1 tablet (10 mg total) by mouth daily. 90 tablet 3  . ranitidine (ZANTAC) 150 MG tablet Take 150 mg by mouth daily.    Marland Kitchen venlafaxine XR (EFFEXOR-XR) 150 MG 24 hr capsule Take 1 capsule (150 mg total) by mouth daily. 90 capsule 3  . desloratadine (CLARINEX) 5 MG tablet Take 1 tablet (5 mg total) by mouth daily. (Patient not taking: Reported on 08/18/2014) 90 tablet 3  . dexlansoprazole (DEXILANT) 60 MG capsule Take 1 capsule (60 mg total) by mouth daily. (Patient not taking: Reported on 08/18/2014) 90 capsule 3  .  mometasone (NASONEX) 50 MCG/ACT nasal spray Place 24 sprays into the nose daily. 51 g 3   No current facility-administered medications on file prior to visit.    Review of Systems:  As per HPI- otherwise negative.   Physical Examination: Filed Vitals:   08/18/14 1415  BP: 112/84  Pulse: 102  Temp: 98.2 F (36.8 C)  Resp: 18   Filed Vitals:   08/18/14 1415  Height: 5' 0.5" (1.537 m)  Weight: 171 lb 12.8 oz (77.928 kg)   Body mass index is 32.99 kg/(m^2). Ideal Body Weight: Weight in (lb) to have BMI = 25: 129.9  GEN: WDWN, NAD, Non-toxic, A & O x 3, obese, looks well HEENT: Atraumatic, Normocephalic. Neck supple. No masses, No LAD. Bilateral TM wnl, oropharynx normal.  PEERL,EOMI.   Cervical spine is non- tender, normal cervical ROM Ears and  Nose: No external deformity. CV: RRR, No M/G/R. No JVD. No thrill. No extra heart sounds. PULM: CTA B, no wheezes, crackles, rhonchi. No retractions. No resp. distress. No accessory muscle use. EXTR: No c/c/e NEURO Normal gait. Normal strength, sensation and DTR all extremities, normal RAM of hands, normal tandem gait and romberg PSYCH: Normally interactive. Conversant. Not depressed or anxious appearing.  Calm demeanor.  She indicates and area over the right parietal as the site of the blow.  There is a small red mark on the skin and minimal soft tissue swelling.  No "goose- egg."  No step off, crepitus or laceration  Given 1,000 mg of tylenol PO- about 10 minutes later she reported that her HA was much better Assessment and Plan: Headache in back of head - Plan: acetaminophen (TYLENOL) 500 MG tablet  Contusion of head, initial encounter - Plan: acetaminophen (TYLENOL) 500 MG tablet  Contusion to head with mild HA Possible concussion but not definite at this time.  Plan recheck on 4/6, she will be OOW and rest until then Advised regarding signs of danger and when to seek emergency care, and how to rest and care for a head injury She will see me in 2 days   Signed Lamar Blinks, MD

## 2014-08-20 ENCOUNTER — Ambulatory Visit (INDEPENDENT_AMBULATORY_CARE_PROVIDER_SITE_OTHER): Payer: Worker's Compensation | Admitting: Family Medicine

## 2014-08-20 VITALS — BP 128/84 | HR 96 | Temp 98.7°F | Resp 18 | Ht 60.0 in | Wt 172.0 lb

## 2014-08-20 DIAGNOSIS — S060X0D Concussion without loss of consciousness, subsequent encounter: Secondary | ICD-10-CM | POA: Diagnosis not present

## 2014-08-20 NOTE — Progress Notes (Signed)
[facility] [address] [place] [address] [phone]  Date:  08/20/2014   Name:  Kieran Nachtigal   DOB:  [birth year]   MRN:  [id number]  PCP:  Scarlette Calico, MD    Chief Complaint: Follow-up   History of Present Illness:  Ramiya Delahunty is a 34 y.o. very pleasant female patient who presents with the following:  She is here today for a recheck.  She suffered a head injury at work on 4/4. An animal cage water bottle fell about 4 feet and hit her on the head.  Overall she is feeling better, she has been resting a good bit. She did get a "migraine" yesterday which was brought on by going out to eat. She laid down and rested and feels better.  She feels pretty good right now. No significant HA now.  Her partner notes that after watching about 15 minutes of TV she may seem a little "dazed."  However she has not had any confusion or memory loss Patient Active Problem List   Diagnosis Date Noted  . RLS (restless legs syndrome) 11/29/2012  . Routine general medical examination at a health care facility 11/29/2012  . Obesity (BMI 30.0-34.9) 11/29/2012  . Need for pneumococcal vaccination 11/29/2012  . GERD (gastroesophageal reflux disease) 10/14/2011  . Low back pain 01/27/2011  . Depression with anxiety 04/19/2010  . ALLERGIC RHINITIS 04/19/2010  . Asthma, intermittent 04/19/2010  . OSTEOARTHRITIS 04/19/2010    Past Medical History  Diagnosis Date  . Headache(784.0)   . UNSPECIFIED TACHYCARDIA   . DEPRESSION   . ASTHMA   . OSTEOARTHRITIS   . ALLERGIC RHINITIS   . Alcohol abuse   . Anxiety     Past Surgical History  Procedure Laterality Date  . Wisdom tooth extraction      History  Substance Use Topics  . Smoking status: Never Smoker   . Smokeless tobacco: Never Used  . Alcohol Use: No    Family History  Problem Relation Age of Onset  . Adopted: Yes  . Alcohol abuse Other   . Arthritis Other     Allergies  Allergen Reactions  .  Penicillins     REACTION: Itching    Medication list has been reviewed and updated.  Current Outpatient Prescriptions on File Prior to Visit  Medication Sig Dispense Refill  . acetaminophen (TYLENOL) 500 MG tablet Take 2 tablets (1,000 mg total) by mouth once. 30 tablet 0  . albuterol (PROVENTIL HFA) 108 (90 BASE) MCG/ACT inhaler Inhale 2 puffs into the lungs every 6 (six) hours as needed for wheezing. 3 Inhaler 3  . beclomethasone (QVAR) 80 MCG/ACT inhaler Inhale 1 puff into the lungs 2 (two) times daily. 3 Inhaler 3  . clonazePAM (KLONOPIN) 1 MG tablet Take 1 tablet (1 mg total) by mouth 2 (two) times daily as needed for anxiety. 60 tablet 5  . desloratadine (CLARINEX) 5 MG tablet Take 1 tablet (5 mg total) by mouth daily. 90 tablet 3  . montelukast (SINGULAIR) 10 MG tablet Take 1 tablet (10 mg total) by mouth daily. 90 tablet 3  . ranitidine (ZANTAC) 150 MG tablet Take 150 mg by mouth daily.    Marland Kitchen venlafaxine (EFFEXOR) 37.5 MG tablet Take 37.5 mg by mouth 2 (two) times daily.    Marland Kitchen venlafaxine XR (EFFEXOR-XR) 150 MG 24 hr capsule Take 1 capsule (150 mg total) by mouth daily. 90 capsule 3  . mometasone (NASONEX) 50 MCG/ACT nasal spray Place 24  sprays into the nose daily. (Patient not taking: Reported on 08/20/2014) 51 g 3   No current facility-administered medications on file prior to visit.    Review of Systems:  As per HPI- otherwise negative.   Physical Examination: Filed Vitals:   08/20/14 1001  BP: 128/84  Pulse: 103  Temp: 98.7 F (37.1 C)  Resp: 18   Filed Vitals:   08/20/14 1001  Height: 5' (1.524 m)  Weight: 172 lb (78.019 kg)   Body mass index is 33.59 kg/(m^2). Ideal Body Weight: Weight in (lb) to have BMI = 25: 127.7  GEN: WDWN, NAD, Non-toxic, A & O x 3, obese, looks well HEENT: Atraumatic, Normocephalic. Neck supple. No masses, No LAD.  Bilateral TM wnl, oropharynx normal.  PEERL,EOMI.   Ears and Nose: No external deformity. CV: RRR, No M/G/R. No JVD. No  thrill. No extra heart sounds. PULM: CTA B, no wheezes, crackles, rhonchi. No retractions. No resp. distress. No accessory muscle use. ABD: S, NT, ND, +BS. No rebound. No HSM. EXTR: No c/c/e NEURO Normal gait.  PSYCH: Normally interactive. Conversant. Not depressed or anxious appearing.  Calm demeanor.  Looks well today, bright and alert Normal strength, sensation and DTR all extremities   Assessment and Plan: Concussion with no loss of consciousness, subsequent encounter  Here today to follow-up head injury which caused a mild concussion.  She is getting better Plan to have her return to 1/2 shifts (3 hours) starting tomorrow and I will recheck her in clinic on Monday. If she has symptoms with 1/2 shifts she is to seek care again with me/ call  Signed Lamar Blinks, MD

## 2014-08-20 NOTE — Patient Instructions (Signed)
You can go back tomorrow for 3 hour shifts, no heavy lifting. Please see me on Monday for a recheck.   If you are not able to do the 3 hour shifts please let me know right away!    You do have a mild concussion- if working causes you to have symptoms such as headache that means we need to back off

## 2014-08-25 ENCOUNTER — Ambulatory Visit (INDEPENDENT_AMBULATORY_CARE_PROVIDER_SITE_OTHER): Payer: Worker's Compensation | Admitting: Family Medicine

## 2014-08-25 ENCOUNTER — Encounter: Payer: Self-pay | Admitting: Family Medicine

## 2014-08-25 VITALS — BP 118/80 | HR 99 | Temp 98.2°F | Resp 16 | Ht 61.0 in | Wt 170.0 lb

## 2014-08-25 DIAGNOSIS — S060X0D Concussion without loss of consciousness, subsequent encounter: Secondary | ICD-10-CM | POA: Diagnosis not present

## 2014-08-25 NOTE — Progress Notes (Signed)
[facility] [address] [place] [address] [phone]  Date:  08/25/2014   Name:  Patty Stanley   DOB:  [birth year]   MRN:  [id number]  PCP:  Scarlette Calico, MD    Chief Complaint: recheck WC (head)   History of Present Illness:  Patty Stanley is a 34 y.o. very pleasant female patient who presents with the following:  Here today to recheck a WC injury that occurred on 4/4 when a glass animal water bottle fell and hit her on the head.  At her last visit on 4/6 we allowed her to RTW at 1/2 shifts (3 hours per shift). Overall she did pretty well- she is still working a little slowly because fast movements cause her to feel dizzy.  She had one headache.  She feels like her concentration is getting better, but still not 100% yet.  She does ok with using her phone, but may get some sx if she watches more than 1 hour of TV at a time.  She is ok listening to TV.  She does ok with mild exercise such as housework.   She does feel like she is continuing to make progress.   Patient Active Problem List   Diagnosis Date Noted  . RLS (restless legs syndrome) 11/29/2012  . Routine general medical examination at a health care facility 11/29/2012  . Obesity (BMI 30.0-34.9) 11/29/2012  . Need for pneumococcal vaccination 11/29/2012  . GERD (gastroesophageal reflux disease) 10/14/2011  . Low back pain 01/27/2011  . Depression with anxiety 04/19/2010  . ALLERGIC RHINITIS 04/19/2010  . Asthma, intermittent 04/19/2010  . OSTEOARTHRITIS 04/19/2010    Past Medical History  Diagnosis Date  . Headache(784.0)   . UNSPECIFIED TACHYCARDIA   . DEPRESSION   . ASTHMA   . OSTEOARTHRITIS   . ALLERGIC RHINITIS   . Alcohol abuse   . Anxiety     Past Surgical History  Procedure Laterality Date  . Wisdom tooth extraction      History  Substance Use Topics  . Smoking status: Never Smoker   . Smokeless tobacco: Never Used  . Alcohol Use: No    Family History   Problem Relation Age of Onset  . Adopted: Yes  . Alcohol abuse Other   . Arthritis Other     Allergies  Allergen Reactions  . Penicillins     REACTION: Itching    Medication list has been reviewed and updated.  Current Outpatient Prescriptions on File Prior to Visit  Medication Sig Dispense Refill  . acetaminophen (TYLENOL) 500 MG tablet Take 2 tablets (1,000 mg total) by mouth once. 30 tablet 0  . albuterol (PROVENTIL HFA) 108 (90 BASE) MCG/ACT inhaler Inhale 2 puffs into the lungs every 6 (six) hours as needed for wheezing. 3 Inhaler 3  . beclomethasone (QVAR) 80 MCG/ACT inhaler Inhale 1 puff into the lungs 2 (two) times daily. 3 Inhaler 3  . clonazePAM (KLONOPIN) 1 MG tablet Take 1 tablet (1 mg total) by mouth 2 (two) times daily as needed for anxiety. 60 tablet 5  . desloratadine (CLARINEX) 5 MG tablet Take 1 tablet (5 mg total) by mouth daily. 90 tablet 3  . montelukast (SINGULAIR) 10 MG tablet Take 1 tablet (10 mg total) by mouth daily. 90 tablet 3  . ranitidine (ZANTAC) 150 MG tablet Take 150 mg by mouth daily.    Marland Kitchen venlafaxine (EFFEXOR) 37.5 MG tablet Take 37.5 mg by mouth 2 (two) times daily.    Marland Kitchen  venlafaxine XR (EFFEXOR-XR) 150 MG 24 hr capsule Take 1 capsule (150 mg total) by mouth daily. 90 capsule 3  . mometasone (NASONEX) 50 MCG/ACT nasal spray Place 24 sprays into the nose daily. (Patient not taking: Reported on 08/20/2014) 51 g 3   No current facility-administered medications on file prior to visit.    Review of Systems:  As per HPI- otherwise negative.  Physical Examination: Filed Vitals:   08/25/14 1144  BP: 118/80  Pulse: 99  Temp: 98.2 F (36.8 C)  Resp: 16   Filed Vitals:   08/25/14 1144  Height: 5\' 1"  (1.549 m)  Weight: 170 lb (77.111 kg)   Body mass index is 32.14 kg/(m^2). Ideal Body Weight: Weight in (lb) to have BMI = 25: 132  GEN: WDWN, NAD, Non-toxic, A & O x 3, obese, looks well HEENT: Atraumatic, Normocephalic. Neck supple. No masses,  No LAD.  Bilateral TM wnl, oropharynx normal.  PEERL,EOMI.   Ears and Nose: No external deformity. CV: RRR, No M/G/R. No JVD. No thrill. No extra heart sounds. PULM: CTA B, no wheezes, crackles, rhonchi. No retractions. No resp. distress. No accessory muscle use. EXTR: No c/c/e NEURO Normal gait.  PSYCH: Normally interactive. Conversant. Not depressed or anxious appearing.  Calm demeanor.    Assessment and Plan: Concussion, without loss of consciousness, subsequent encounter  Doing well with her concussion, getting better.  Will allow to RTW longer shifts, but with required breaks as per instruction.  If not doing well she is to stop and come back/ call She will see me again on 4/22  Signed Lamar Blinks, MD

## 2014-08-25 NOTE — Patient Instructions (Signed)
Good to see you today!  You may go back to your longer shifts at work, with the following restriction: If you work up to 6 hours, you need a full 1 hour break during the day If you work more than 6 hours, you need 2 full 1 hour breaks during the day Please see me in about 2 weeks

## 2014-09-05 ENCOUNTER — Ambulatory Visit (INDEPENDENT_AMBULATORY_CARE_PROVIDER_SITE_OTHER): Payer: Worker's Compensation | Admitting: Family Medicine

## 2014-09-05 VITALS — BP 110/88 | HR 96 | Temp 98.5°F | Resp 16 | Ht 59.5 in | Wt 172.4 lb

## 2014-09-05 DIAGNOSIS — S060X0D Concussion without loss of consciousness, subsequent encounter: Secondary | ICD-10-CM

## 2014-09-05 NOTE — Progress Notes (Signed)
Patty Stanley 07-12-1980 34 y.o.   Chief Complaint  Patient presents with  . Follow-up    for concussion     Date of Injury: 08/18/14  History of Present Illness:  Presents for evaluation of work-related complaint. She suffered a head injury when a glass water bottle fell and hit her in the head at work on 4/4.  She has bene improving, last seen here on 4/11.  At that time she was improving and we advanced her work restrictions.  She notes that she continues to improve, she is able to read/ use computer etc without any problems  She may still get mild HA now and then but this is not unusual for her   Pulse Readings from Last 3 Encounters:  09/05/14 116  08/25/14 99  08/20/14 96      ROS    Allergies  Allergen Reactions  . Penicillins     REACTION: Itching     Current medications reviewed and updated. Past medical history, family history, social history have been reviewed and updated.   Physical Exam  GEN: WDWN, NAD, Non-toxic, A & O x 3, overweight, looks well HEENT: Atraumatic, Normocephalic. Neck supple. No masses, No LAD.  Bilateral TM wnl, oropharynx normal.  PEERL,EOMI.   Ears and Nose: No external deformity. CV: RRR, No M/G/R. No JVD. No thrill. No extra heart sounds. PULM: CTA B, no wheezes, crackles, rhonchi. No retractions. No resp. distress. No accessory muscle use. EXTR: No c/c/e NEURO Normal gait. Normal strength, sensation and DTR all extremities,  Normal romberg.  She no longer has any tenderness in her head PSYCH: Normally interactive. Conversant. Not depressed or anxious appearing.  Calm demeanor.    Assessment and Plan: Concussion without loss of consciousness, subsequent encounter  Feeling much better from her concussion earlier in the month.  Would like to return to full duty Follow-up only if needed

## 2014-09-05 NOTE — Patient Instructions (Signed)
Good to see you today- I'm glad that you are feeling better You do not have to come back and see [place] unless you have any other concerns

## 2014-12-17 ENCOUNTER — Emergency Department (HOSPITAL_COMMUNITY)
Admission: EM | Admit: 2014-12-17 | Discharge: 2014-12-17 | Disposition: A | Discharge status: Home / Self Care | Payer: Self-pay | Attending: Emergency Medicine | Admitting: Emergency Medicine

## 2014-12-17 ENCOUNTER — Encounter (HOSPITAL_COMMUNITY): Payer: Self-pay | Admitting: *Deleted

## 2014-12-17 DIAGNOSIS — M1990 Unspecified osteoarthritis, unspecified site: Secondary | ICD-10-CM | POA: Insufficient documentation

## 2014-12-17 DIAGNOSIS — L02211 Cutaneous abscess of abdominal wall: Secondary | ICD-10-CM | POA: Insufficient documentation

## 2014-12-17 DIAGNOSIS — F419 Anxiety disorder, unspecified: Secondary | ICD-10-CM | POA: Insufficient documentation

## 2014-12-17 DIAGNOSIS — Z880 Allergy status to penicillin: Secondary | ICD-10-CM | POA: Insufficient documentation

## 2014-12-17 DIAGNOSIS — Z79899 Other long term (current) drug therapy: Secondary | ICD-10-CM | POA: Insufficient documentation

## 2014-12-17 DIAGNOSIS — J45909 Unspecified asthma, uncomplicated: Secondary | ICD-10-CM | POA: Insufficient documentation

## 2014-12-17 DIAGNOSIS — L0291 Cutaneous abscess, unspecified: Secondary | ICD-10-CM

## 2014-12-17 DIAGNOSIS — F329 Major depressive disorder, single episode, unspecified: Secondary | ICD-10-CM | POA: Insufficient documentation

## 2014-12-17 LAB — URINALYSIS, ROUTINE W REFLEX MICROSCOPIC
Bilirubin Urine: NEGATIVE
Glucose, UA: NEGATIVE mg/dL
HGB URINE DIPSTICK: NEGATIVE
Ketones, ur: NEGATIVE mg/dL
Leukocytes, UA: NEGATIVE
NITRITE: NEGATIVE
PH: 6 (ref 5.0–8.0)
Protein, ur: NEGATIVE mg/dL
Specific Gravity, Urine: 1.004 — ABNORMAL LOW (ref 1.005–1.030)
Urobilinogen, UA: 0.2 mg/dL (ref 0.0–1.0)

## 2014-12-17 LAB — COMPREHENSIVE METABOLIC PANEL
ALK PHOS: 125 U/L (ref 38–126)
ALT: 12 U/L — ABNORMAL LOW (ref 14–54)
ANION GAP: 6 (ref 5–15)
AST: 16 U/L (ref 15–41)
Albumin: 3.9 g/dL (ref 3.5–5.0)
BILIRUBIN TOTAL: 0.4 mg/dL (ref 0.3–1.2)
BUN: 8 mg/dL (ref 6–20)
CO2: 29 mmol/L (ref 22–32)
Calcium: 8.5 mg/dL — ABNORMAL LOW (ref 8.9–10.3)
Chloride: 104 mmol/L (ref 101–111)
Creatinine, Ser: 0.72 mg/dL (ref 0.44–1.00)
GFR calc Af Amer: >60 mL/min (ref >60)
GFR calc non Af Amer: >60 mL/min (ref >60)
Glucose, Bld: 102 mg/dL — ABNORMAL HIGH (ref 65–99)
POTASSIUM: 4.6 mmol/L (ref 3.5–5.1)
SODIUM: 139 mmol/L (ref 135–145)
TOTAL PROTEIN: 7.6 g/dL (ref 6.5–8.1)

## 2014-12-17 LAB — CBC
HEMATOCRIT: 34.9 % — AB (ref 36.0–46.0)
Hemoglobin: 10.9 g/dL — ABNORMAL LOW (ref 12.0–15.0)
MCH: 26.7 pg (ref 26.0–34.0)
MCHC: 31.2 g/dL (ref 30.0–36.0)
MCV: 85.3 fL (ref 78.0–100.0)
Platelets: 379 10*3/uL (ref 150–400)
RBC: 4.09 MIL/uL (ref 3.87–5.11)
RDW: 13.4 % (ref 11.5–15.5)
WBC: 11.3 10*3/uL — ABNORMAL HIGH (ref 4.0–10.5)

## 2014-12-17 MED ORDER — HYDROCODONE-ACETAMINOPHEN 5-325 MG PO TABS: 2.0000 | ORAL_TABLET | ORAL | Status: DC | PRN

## 2014-12-17 MED ORDER — LIDOCAINE-EPINEPHRINE (PF) 1 %-1:200000 IJ SOLN
10.0000 mL | Freq: Once | INTRAMUSCULAR | Status: AC
  Administered 2014-12-17: 10 mL
  Filled 2014-12-17: qty 30

## 2014-12-17 MED ORDER — DOXYCYCLINE HYCLATE 100 MG PO CAPS: 100.0000 mg | ORAL_CAPSULE | Freq: Two times a day (BID) | ORAL | Status: DC

## 2014-12-17 MED ORDER — ONDANSETRON 4 MG PO TBDP
4.0000 mg | ORAL_TABLET | Freq: Once | ORAL | Status: AC
  Administered 2014-12-17: 4 mg via ORAL
  Filled 2014-12-17: qty 1

## 2014-12-17 MED ORDER — DOXYCYCLINE HYCLATE 100 MG PO TABS
100.0000 mg | ORAL_TABLET | Freq: Once | ORAL | Status: AC
  Administered 2014-12-17: 100 mg via ORAL
  Filled 2014-12-17: qty 1

## 2014-12-17 MED ORDER — MORPHINE SULFATE 4 MG/ML IJ SOLN
8.0000 mg | Freq: Once | INTRAMUSCULAR | Status: AC
  Administered 2014-12-17: 8 mg via INTRAMUSCULAR
  Filled 2014-12-17: qty 2

## 2014-12-17 NOTE — Discharge Instructions (Signed)
In 48 hours, soak and remove the gauze from the abscess. After that, continue soaks and gentle massage twice daily. Check here if not improving.  Abscess Care After An abscess (also called a boil or furuncle) is an infected area that contains a collection of pus. Signs and symptoms of an abscess include pain, tenderness, redness, or hardness, or you may feel a moveable soft area under your skin. An abscess can occur anywhere in the body. The infection may spread to surrounding tissues causing cellulitis. A cut (incision) by the surgeon was made over your abscess and the pus was drained out. Gauze may have been packed into the space to provide a drain that will allow the cavity to heal from the inside outwards. The boil may be painful for 5 to 7 days. Most people with a boil do not have high fevers. Your abscess, if seen early, may not have localized, and may not have been lanced. If not, another appointment may be required for this if it does not get better on its own or with medications. HOME CARE INSTRUCTIONS   Only take over-the-counter or prescription medicines for pain, discomfort, or fever as directed by your caregiver.  When you bathe, soak and then remove gauze or iodoform packs at least daily or as directed by your caregiver. You may then wash the wound gently with mild soapy water. Repack with gauze or do as your caregiver directs. SEEK IMMEDIATE MEDICAL CARE IF:   You develop increased pain, swelling, redness, drainage, or bleeding in the wound site.  You develop signs of generalized infection including muscle aches, chills, fever, or a general ill feeling.  An oral temperature above 102 F (38.9 C) develops, not controlled by medication. See your caregiver for a recheck if you develop any of the symptoms described above. If medications (antibiotics) were prescribed, take them as directed. Document Released: 11/18/2004 Document Revised: 07/25/2011 Document Reviewed:  07/16/2007 [facility] Patient Information 2015 [place], [place]. This information is not intended to replace advice given to you by your health care provider. Make sure you discuss any questions you have with your health care provider.

## 2014-12-17 NOTE — ED Provider Notes (Signed)
CSN: [id number]     Arrival date & time 12/17/14  1800 History   First MD Initiated Contact with Patient 12/17/14 1855     Chief Complaint  Patient presents with  . Abdominal Pain      HPI  She presents for evaluation with complaint of left lower quadrant abdominal pain and an abnormal CT scan. Describes redness and pain in her left lower abdomen just above her groin crease. States she was seen by her primary care physician/PA. States a CT scan was ordered and she was called at home stating that she had "an abscess". Directed to the emergency room.  No fever. Redness and pain and swelling. No nausea vomiting or diarrhea. No past similar episodes.   Past Medical History  Diagnosis Date  . Headache(784.0)   . UNSPECIFIED TACHYCARDIA   . DEPRESSION   . ASTHMA   . OSTEOARTHRITIS   . ALLERGIC RHINITIS   . Alcohol abuse   . Anxiety    Past Surgical History  Procedure Laterality Date  . Wisdom tooth extraction     Family History  Problem Relation Age of Onset  . Adopted: Yes  . Alcohol abuse Other   . Arthritis Other    History  Substance Use Topics  . Smoking status: Never Smoker   . Smokeless tobacco: Never Used  . Alcohol Use: No   OB History    No data available     Review of Systems  Constitutional: Negative for fever, chills, diaphoresis, appetite change and fatigue.  HENT: Negative for mouth sores, sore throat and trouble swallowing.   Eyes: Negative for visual disturbance.  Respiratory: Negative for cough, chest tightness, shortness of breath and wheezing.   Cardiovascular: Negative for chest pain.  Gastrointestinal: Negative for nausea, vomiting, abdominal pain, diarrhea and abdominal distention.  Endocrine: Negative for polydipsia, polyphagia and polyuria.  Genitourinary: Negative for dysuria, frequency and hematuria.  Musculoskeletal: Negative for gait problem.  Skin: Positive for color change. Negative for pallor and rash.  Neurological: Negative for  dizziness, syncope, light-headedness and headaches.  Hematological: Does not bruise/bleed easily.  Psychiatric/Behavioral: Negative for behavioral problems and confusion.      Allergies  Penicillins  Home Medications   Prior to Admission medications   Medication Sig Start Date End Date Taking? Authorizing Provider  acetaminophen (TYLENOL) 500 MG tablet Take 2 tablets (1,000 mg total) by mouth once. Patient taking differently: Take 1,000 mg by mouth every 6 (six) hours as needed for moderate pain.  08/18/14  Yes Gay Filler Copland, MD  acetaminophen-codeine (TYLENOL #3) 300-30 MG per tablet Take 1 tablet by mouth every 4 (four) hours as needed for moderate pain or severe pain.   Yes Historical Provider, MD  cetirizine (ZYRTEC) 10 MG tablet Take 10 mg by mouth daily.   Yes Historical Provider, MD  clonazePAM (KLONOPIN) 1 MG tablet Take 1 tablet (1 mg total) by mouth 2 (two) times daily as needed for anxiety. 11/29/12  Yes Janith Lima, MD  montelukast (SINGULAIR) 10 MG tablet Take 1 tablet (10 mg total) by mouth daily. 11/26/10  Yes Janith Lima, MD  omeprazole (PRILOSEC) 40 MG capsule Take 40 mg by mouth daily.   Yes Historical Provider, MD  ranitidine (ZANTAC) 150 MG tablet Take 150 mg by mouth daily.   Yes Historical Provider, MD  venlafaxine (EFFEXOR) 37.5 MG tablet Take 37.5 mg by mouth daily.    Yes Historical Provider, MD  venlafaxine XR (EFFEXOR-XR) 150 MG 24 hr capsule  Take 1 capsule (150 mg total) by mouth daily. Patient taking differently: Take 150 mg by mouth daily. Take along with 37.5 mg. 12/11/12  Yes Janith Lima, MD  albuterol (PROVENTIL HFA) 108 (90 BASE) MCG/ACT inhaler Inhale 2 puffs into the lungs every 6 (six) hours as needed for wheezing. 12/11/12   Janith Lima, MD  beclomethasone (QVAR) 80 MCG/ACT inhaler Inhale 1 puff into the lungs 2 (two) times daily. Patient taking differently: Inhale 1 puff into the lungs 2 (two) times daily as needed (asthma).  12/11/12 09/16/15   Janith Lima, MD  desloratadine (CLARINEX) 5 MG tablet Take 1 tablet (5 mg total) by mouth daily. Patient not taking: Reported on 12/17/2014 12/11/12   Janith Lima, MD  doxycycline (VIBRAMYCIN) 100 MG capsule Take 1 capsule (100 mg total) by mouth 2 (two) times daily. 12/17/14   Tanna Furry, MD  HYDROcodone-acetaminophen (NORCO/VICODIN) 5-325 MG per tablet Take 2 tablets by mouth every 4 (four) hours as needed. 12/17/14   Tanna Furry, MD  mometasone (NASONEX) 50 MCG/ACT nasal spray Place 24 sprays into the nose daily. Patient not taking: Reported on 08/20/2014 12/11/12 12/11/13  Janith Lima, MD   BP 140/91 mmHg  Pulse 107  Temp(Src) 98.6 F (37 C) (Oral)  Resp 16  SpO2 95%  LMP 12/10/2014 Physical Exam  Constitutional: She is oriented to person, place, and time. She appears well-developed and well-nourished. No distress.  HENT:  Head: Normocephalic.  Eyes: Conjunctivae are normal. Pupils are equal, round, and reactive to light. No scleral icterus.  Neck: Normal range of motion. Neck supple. No thyromegaly present.  Cardiovascular: Normal rate and regular rhythm.  Exam reveals no gallop and no friction rub.   No murmur heard. Pulmonary/Chest: Effort normal and breath sounds normal. No respiratory distress. She has no wheezes. She has no rales.  Abdominal: Soft. Bowel sounds are normal. She exhibits no distension. There is no tenderness. There is no rebound.    Musculoskeletal: Normal range of motion.  Neurological: She is alert and oriented to person, place, and time.  Skin: Skin is warm and dry. No rash noted.  Psychiatric: She has a normal mood and affect. Her behavior is normal.    ED Course  Procedures (including critical care time) Labs Review Labs Reviewed  COMPREHENSIVE METABOLIC PANEL - Abnormal; Notable for the following:    Glucose, Bld 102 (*)    Calcium 8.5 (*)    ALT 12 (*)    All other components within normal limits  CBC - Abnormal; Notable for the following:     WBC 11.3 (*)    Hemoglobin 10.9 (*)    HCT 34.9 (*)    All other components within normal limits  URINALYSIS, ROUTINE W REFLEX MICROSCOPIC (NOT AT [facility]) - Abnormal; Notable for the following:    Specific Gravity, Urine 1.004 (*)    All other components within normal limits    Imaging Review No results found.   EKG Interpretation None      MDM   Final diagnoses:  Abscess    Dressing was applied. Patient discharged home. Instructed to soak, and remove the small amount of gauze in the wound in 48 hours. Continue antibiotics. Pain medicine when necessary. After gauze is removed continue her soaks and gentle massage. Recheck if not improving.  INCISION AND DRAINAGE Performed by: Lolita Patella Consent: Verbal consent obtained. Risks and benefits: risks, benefits and alternatives were discussed Type: abscess  Body area: Lt groin  Anesthesia: local infiltration  Incision was made with a scalpel.  Local anesthetic: lidocaine 1% c epinephrine  Anesthetic total: 3 ml  Complexity: complex Blunt dissection to break up loculations  Drainage: purulent  Drainage amount: scant, purulent  Packing material: 1/4 in iodoform gauze  Patient tolerance: Patient tolerated the procedure well with no immediate complications.       Tanna Furry, MD 12/17/14 2232

## 2014-12-17 NOTE — ED Notes (Addendum)
Pt complains of LLQ abdominal pain for the past week. Pt states her doctor believes she has an abscess. Pt denies nv/d. Pt states she had abdominal CT with contrast at [facility] in [place]. Pt states she was called and told to come to ED.

## 2015-03-10 ENCOUNTER — Ambulatory Visit (HOSPITAL_COMMUNITY)
Admission: RE | Admit: 2015-03-10 | Discharge: 2015-03-10 | Disposition: A | Discharge status: Home / Self Care | Payer: Self-pay | Source: Ambulatory Visit | Attending: Family Medicine | Admitting: Family Medicine

## 2015-03-10 ENCOUNTER — Other Ambulatory Visit (HOSPITAL_COMMUNITY): Payer: Self-pay | Admitting: Family Medicine

## 2015-03-10 DIAGNOSIS — M25561 Pain in right knee: Secondary | ICD-10-CM

## 2015-03-10 DIAGNOSIS — M25562 Pain in left knee: Secondary | ICD-10-CM | POA: Insufficient documentation

## 2015-03-10 DIAGNOSIS — M545 Low back pain, unspecified: Secondary | ICD-10-CM

## 2015-03-10 DIAGNOSIS — D169 Benign neoplasm of bone and articular cartilage, unspecified: Secondary | ICD-10-CM | POA: Insufficient documentation

## 2015-03-10 DIAGNOSIS — M4186 Other forms of scoliosis, lumbar region: Secondary | ICD-10-CM | POA: Insufficient documentation

## 2015-05-20 MED FILL — MELOXICAM 15 MG TABLET: 15 | 30 days supply | Qty: 30 | Fill #0

## 2015-05-20 MED FILL — DOXYCYCLINE HYCLATE 100 MG: 100 | 10 days supply | Qty: 20 | Fill #0

## 2015-06-02 MED FILL — TERBINAFINE HCL 250 MG TAB: 250 | 10 days supply | Qty: 10 | Fill #8

## 2015-06-30 MED FILL — MONTELUKAST SOD 10 MG TAB: 10 | 90 days supply | Qty: 90 | Fill #0

## 2015-07-08 MED FILL — MELOXICAM 15 MG TABLET: 15 | 30 days supply | Qty: 30 | Fill #1

## 2015-08-06 MED FILL — MELOXICAM 15 MG TABLET: 15 | 30 days supply | Qty: 30 | Fill #2

## 2015-08-18 MED FILL — OMEPRAZOLE DR 20 MG CAPSULE: 20 | 90 days supply | Qty: 90 | Fill #0

## 2015-11-02 MED FILL — clonazePAM 1 MG TABS: 1 | 30 days supply | Qty: 30 | Fill #0

## 2015-11-02 MED FILL — MELOXICAM 15 MG TABLET: 15 | 30 days supply | Qty: 30 | Fill #0

## 2015-11-04 MED FILL — MONTELUKAST SOD 10 MG TAB: 10 | 30 days supply | Qty: 30 | Fill #0

## 2015-11-19 IMAGING — CR DG KNEE 1-2V*R*
2 series · 2 of 2 positions shown · non-contrast
Comparison: None.

CLINICAL DATA: Worsening generalized bilateral knee pain, no
injury.

EXAM:
RIGHT KNEE - 1-2 VIEW

[t knee ap right]
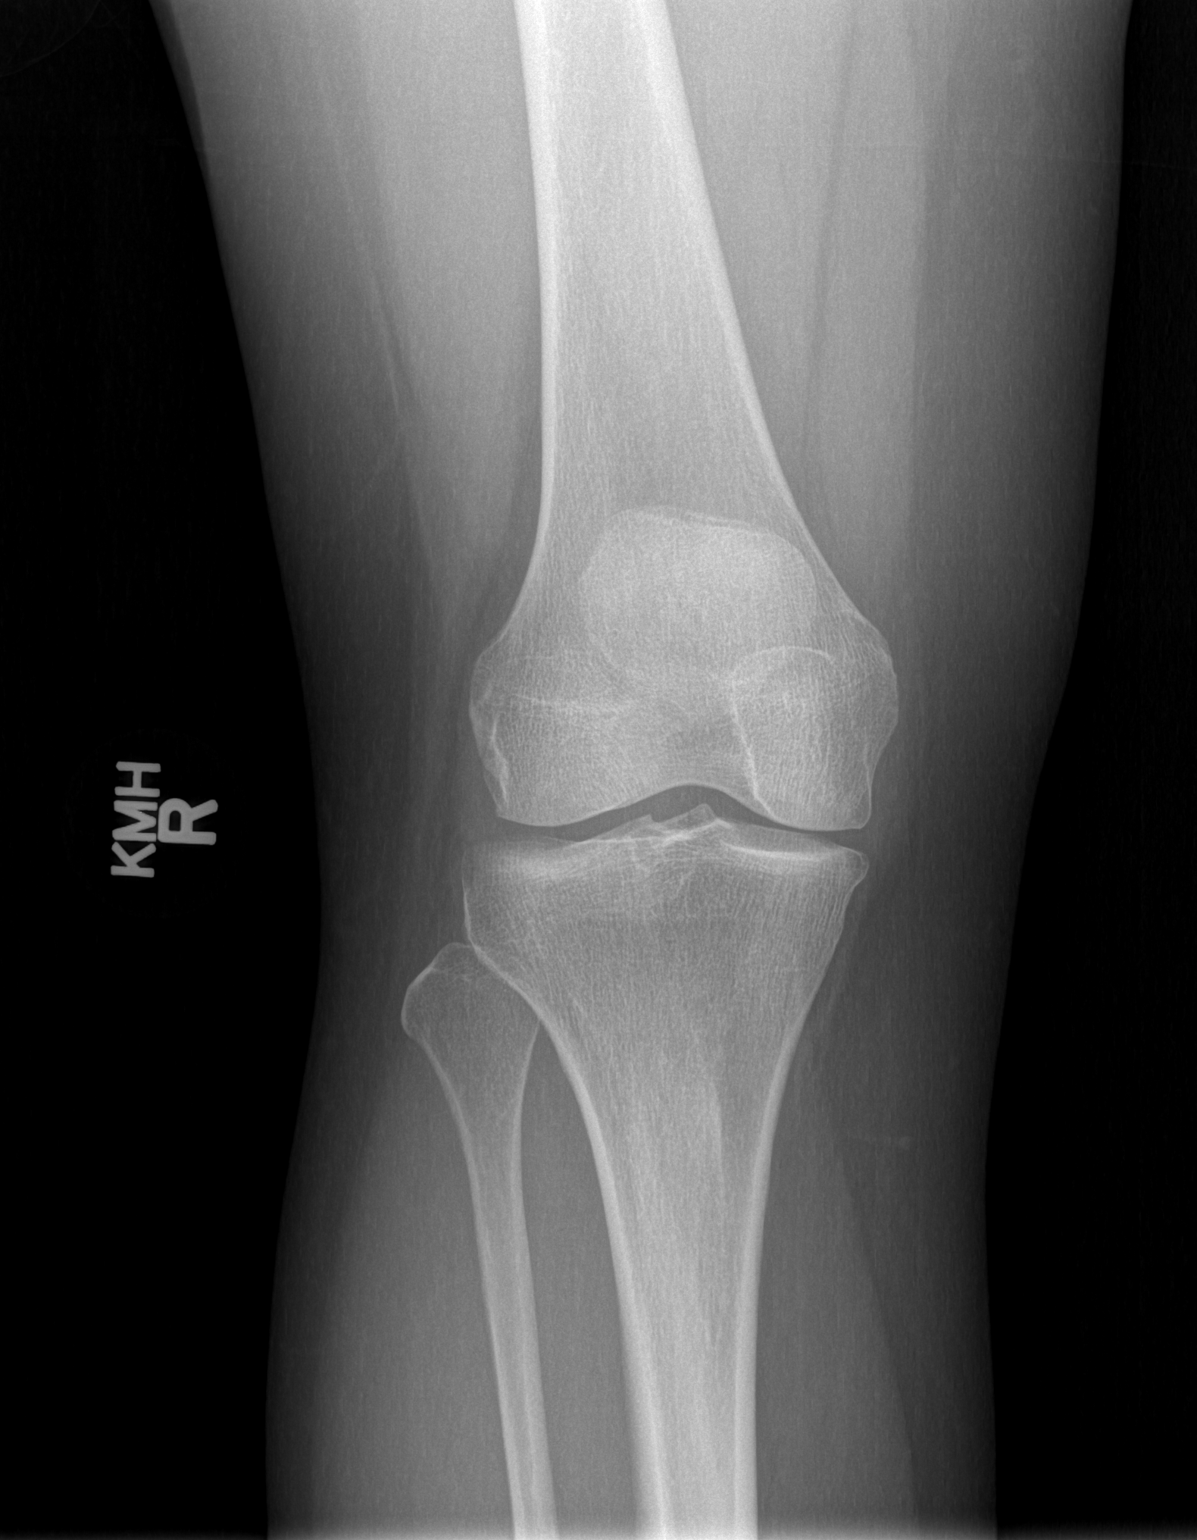

[t knee lat right]
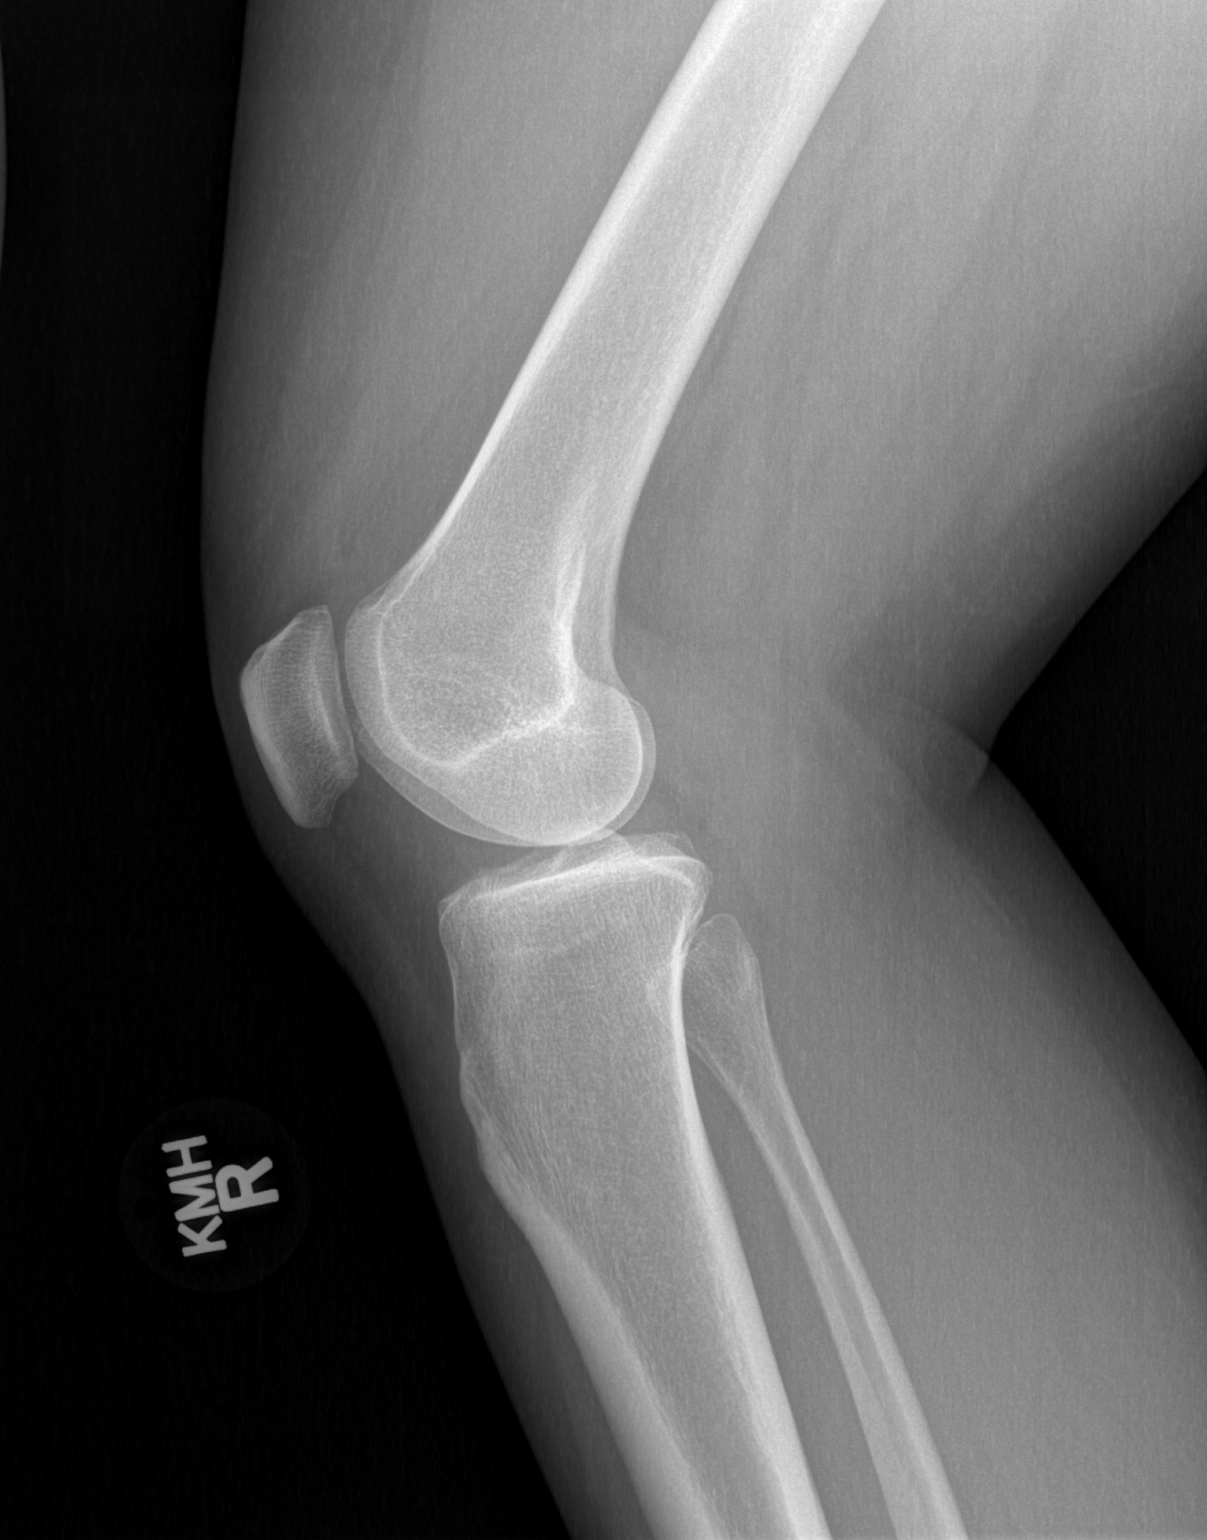

[2 of 2 positions shown; findings below may reference images not displayed]

FINDINGS: No acute osseous or joint abnormality.  No degenerative changes.
IMPRESSION: Negative.

## 2015-11-26 MED FILL — OMEPRAZOLE DR 20 MG CAPSULE: 20 | 90 days supply | Qty: 90 | Fill #0

## 2015-11-27 MED FILL — VENLAFAXINE HCL ER 150 MG C: 150 | 30 days supply | Qty: 30 | Fill #0

## 2015-11-27 MED FILL — VENLAFAXINE HCL ER 37.5 MG: 37.5 | 90 days supply | Qty: 90 | Fill #0

## 2015-12-31 MED FILL — VENLAFAXINE HCL ER 75 MG CA: 75 | 90 days supply | Qty: 90 | Fill #0

## 2015-12-31 MED FILL — MONTELUKAST SOD 10 MG TAB: 10 | 90 days supply | Qty: 90 | Fill #0

## 2015-12-31 MED FILL — VENLAFAXINE HCL ER 150 MG C: 150 | 90 days supply | Qty: 90 | Fill #0

## 2016-02-22 MED FILL — OMEPRAZOLE 20 MG CAPSULE DR: 20 | 90 days supply | Qty: 90 | Fill #1

## 2016-03-29 MED FILL — VENLAFAXINE HCL ER 75 MG CA: 75 | 90 days supply | Qty: 90 | Fill #1

## 2016-03-29 MED FILL — VENLAFAXINE HCL ER 150 MG C: 150 | 90 days supply | Qty: 90 | Fill #1

## 2016-03-29 MED FILL — MONTELUKAST SOD 10 MG TAB: 10 | 90 days supply | Qty: 90 | Fill #1

## 2016-04-19 MED FILL — MELOXICAM 15 MG TABLET: 15 | 90 days supply | Qty: 90 | Fill #0

## 2016-04-19 MED FILL — ZOLPIDEM TARTRATE 10 MG TAB: 10 | 30 days supply | Qty: 30 | Fill #0

## 2016-05-18 MED FILL — VENTOLIN HFA 90 MCG INHALER: 108 (90 BAS | 16 days supply | Qty: 18 | Fill #0

## 2016-05-23 MED FILL — OMEPRAZOLE 20 MG CAPSULE DR: 20 | 90 days supply | Qty: 90 | Fill #0

## 2016-06-02 MED FILL — MONTELUKAST SOD 10 MG TAB: 10 | 90 days supply | Qty: 90 | Fill #0

## 2016-06-02 MED FILL — ONDANSETRON HCL 4 MG TABLET: 4 | 30 days supply | Qty: 30 | Fill #0

## 2016-06-27 MED FILL — clonazePAM 1 MG TABS: 1 | 30 days supply | Qty: 30 | Fill #0

## 2016-06-30 MED FILL — VENLAFAXINE HCL ER 75 MG CA: 75 | 90 days supply | Qty: 90 | Fill #0

## 2016-06-30 MED FILL — VENLAFAXINE HCL ER 150 MG C: 150 | 90 days supply | Qty: 90 | Fill #0

## 2016-07-25 MED FILL — clonazePAM 1 MG TABS: 1 | 30 days supply | Qty: 30 | Fill #0

## 2016-07-25 MED FILL — MELOXICAM 15 MG TABLET: 15 | 90 days supply | Qty: 90 | Fill #1

## 2016-08-22 MED FILL — MONTELUKAST SOD 10 MG TAB: 10 | 90 days supply | Qty: 90 | Fill #1

## 2016-08-26 MED FILL — OMEPRAZOLE 20 MG CAPSULE DR: 20 | 90 days supply | Qty: 90 | Fill #0

## 2016-09-26 MED FILL — VENLAFAXINE HCL ER 75 MG CA: 75 | 90 days supply | Qty: 90 | Fill #0

## 2016-09-26 MED FILL — VENLAFAXINE HCL ER 150 MG C: 150 | 90 days supply | Qty: 90 | Fill #0

## 2016-11-25 MED FILL — MONTELUKAST SOD 10 MG TAB: 10 | 90 days supply | Qty: 90 | Fill #0

## 2016-11-25 MED FILL — OMEPRAZOLE 20 MG CAPSULE DR: 20 | 90 days supply | Qty: 90 | Fill #0

## 2016-12-27 MED FILL — VENLAFAXINE HCL ER 75 MG CA: 75 | 90 days supply | Qty: 90 | Fill #1

## 2016-12-27 MED FILL — VENLAFAXINE HCL ER 150 MG C: 150 | 90 days supply | Qty: 90 | Fill #0

## 2017-02-24 MED FILL — OMEPRAZOLE 20 MG CAP: 20 | 90 days supply | Qty: 90 | Fill #0

## 2017-03-15 MED FILL — MONTELUKAST SOD 10 MG TAB: 10 | 90 days supply | Qty: 90 | Fill #0

## 2017-03-29 MED FILL — VENLAFAXINE HCL ER 150 MG C: 150 | 90 days supply | Qty: 90 | Fill #0

## 2017-03-30 MED FILL — VENLAFAXINE HCL ER 75 MG CA: 75 | 90 days supply | Qty: 90 | Fill #0

## 2017-05-26 MED FILL — OMEPRAZOLE 20 MG CAP: 20 | 90 days supply | Qty: 90 | Fill #1

## 2017-06-06 MED FILL — MONTELUKAST SOD 10 MG TAB: 10 | 90 days supply | Qty: 90 | Fill #0

## 2017-06-06 MED FILL — VENLAFAXINE HCL ER 75 MG CA: 75 | 90 days supply | Qty: 90 | Fill #0

## 2017-06-06 MED FILL — OMEPRAZOLE 20 MG CAP: 20 | 90 days supply | Qty: 90 | Fill #0

## 2017-06-06 MED FILL — MELOXICAM 15 MG TABLET: 15 | 90 days supply | Qty: 90 | Fill #0

## 2017-06-06 MED FILL — VENLAFAXINE HCL ER 150 MG C: 150 | 90 days supply | Qty: 90 | Fill #0

## 2017-06-06 MED FILL — NORG-ETHIN ESTRA 0.25-0.035: 0.25-35 | 84 days supply | Qty: 84 | Fill #0

## 2017-08-31 MED FILL — ESTARYLLA 0.25-35 MG-MCG TA: 0.25-35 | 84 days supply | Qty: 84 | Fill #1

## 2017-09-07 MED FILL — MONTELUKAST SOD 10 MG TAB: 10 | 90 days supply | Qty: 90 | Fill #1

## 2017-09-22 MED FILL — VENLAFAXINE HCL ER 150 MG C: 150 | 90 days supply | Qty: 90 | Fill #1

## 2017-09-22 MED FILL — VENLAFAXINE HCL ER 75 MG CA: 75 | 90 days supply | Qty: 90 | Fill #1

## 2017-11-21 MED FILL — OMEPRAZOLE 20 MG CAP: 20 | 90 days supply | Qty: 90 | Fill #1

## 2017-11-28 MED FILL — FEMYNOR 0.25-35 MG-MCG TABS: 0.25-35 | 84 days supply | Qty: 84 | Fill #2

## 2017-12-25 MED FILL — VENLAFAXINE HCL ER 150 MG C: 150 | 90 days supply | Qty: 90 | Fill #1

## 2017-12-25 MED FILL — VENLAFAXINE HCL ER 75 MG CA: 75 | 30 days supply | Qty: 30 | Fill #0

## 2017-12-25 MED FILL — MONTELUKAST SOD 10 MG TAB: 10 | 90 days supply | Qty: 90 | Fill #1

## 2018-01-29 MED FILL — VENLAFAXINE HCL ER 75 MG CA: 75 | 30 days supply | Qty: 30 | Fill #1

## 2018-02-02 MED FILL — MELOXICAM 15 MG TABLET: 15 | 90 days supply | Qty: 90 | Fill #1

## 2018-02-21 MED FILL — VENLAFAXINE HCL ER 75 MG CA: 75 | 30 days supply | Qty: 30 | Fill #2

## 2018-02-21 MED FILL — OMEPRAZOLE 20 MG CPDR: 20 | 90 days supply | Qty: 90 | Fill #0

## 2018-02-22 MED FILL — FEMYNOR 0.25-35 MG-MCG TABS: 0.25-35 | 84 days supply | Qty: 84 | Fill #3

## 2018-03-22 MED FILL — MONTELUKAST SOD 10 MG TAB: 10 | 90 days supply | Qty: 90 | Fill #0

## 2018-03-22 MED FILL — VENLAFAXINE HCL ER 75 MG CA: 75 | 30 days supply | Qty: 30 | Fill #3

## 2018-03-22 MED FILL — VENLAFAXINE HCL ER 150 MG C: 150 | 30 days supply | Qty: 30 | Fill #0

## 2018-04-23 MED FILL — MELOXICAM 15 MG TABLET: 15 | 90 days supply | Qty: 90 | Fill #0

## 2018-04-23 MED FILL — VENLAFAXINE HCL ER 150 MG C: 150 | 30 days supply | Qty: 30 | Fill #1

## 2018-04-23 MED FILL — VENLAFAXINE HCL ER 75 MG CA: 75 | 30 days supply | Qty: 30 | Fill #4

## 2018-05-10 MED FILL — FEMYNOR 0.25-35 MG-MCG TABS: 0.25-35 | 84 days supply | Qty: 84 | Fill #0

## 2018-05-10 MED FILL — OMEPRAZOLE 20 MG CPDR: 20 | 90 days supply | Qty: 90 | Fill #1

## 2018-05-25 MED FILL — VENLAFAXINE HCL ER 150 MG C: 150 | 30 days supply | Qty: 30 | Fill #2

## 2018-05-30 MED FILL — levoFLOXacin 750 MG TABS: 750 | 10 days supply | Qty: 10 | Fill #0

## 2018-05-30 MED FILL — predniSONE 10 MG (21) TBPK: 10 | 6 days supply | Qty: 21 | Fill #0

## 2018-05-30 MED FILL — VENLAFAXINE HCL ER 75 MG CA: 75 | 30 days supply | Qty: 30 | Fill #5

## 2018-06-12 MED FILL — FEMYNOR 0.25-35 MG-MCG TABS: 0.25-35 | 84 days supply | Qty: 84 | Fill #0

## 2018-06-14 MED FILL — MONTELUKAST SOD 10 MG TAB: 10 | 30 days supply | Qty: 30 | Fill #0

## 2018-06-26 MED FILL — VENLAFAXINE HCL ER 150 MG C: 150 | 30 days supply | Qty: 30 | Fill #0

## 2018-06-26 MED FILL — VENLAFAXINE HCL ER 75 MG CA: 75 | 30 days supply | Qty: 30 | Fill #0

## 2018-06-26 MED FILL — DOXYCYCLINE HYCLATE 100 MG: 100 | 10 days supply | Qty: 20 | Fill #0

## 2018-07-13 MED FILL — CHLORHEXIDINE 0.12% RINSE: 0.12 | 7 days supply | Qty: 473 | Fill #0

## 2018-07-13 MED FILL — VENLAFAXINE HCL ER 37.5 MG: 37.5 | 30 days supply | Qty: 30 | Fill #0

## 2018-07-13 MED FILL — FAMOTIDINE 20 MG TABLET: 20 | 30 days supply | Qty: 60 | Fill #0

## 2018-07-27 MED FILL — LEVOCETIRIZINE 5 MG TABLET: 5 | 30 days supply | Qty: 30 | Fill #0

## 2018-08-01 MED FILL — MONTELUKAST SOD 10 MG TAB: 10 | 30 days supply | Qty: 30 | Fill #1

## 2018-08-01 MED FILL — VENLAFAXINE HCL ER 150 MG C: 150 | 30 days supply | Qty: 30 | Fill #1

## 2018-08-03 MED FILL — FAMOTIDINE 20 MG TABLET: 20 | 30 days supply | Qty: 60 | Fill #1

## 2018-08-03 MED FILL — MELOXICAM 15 MG TABLET: 15 | 90 days supply | Qty: 90 | Fill #0

## 2018-08-16 MED FILL — VENLAFAXINE HCL ER 37.5 MG: 37.5 | 30 days supply | Qty: 30 | Fill #0

## 2018-08-17 MED FILL — VENLAFAXINE HCL ER 37.5 MG: 37.5 | 30 days supply | Qty: 30 | Fill #0

## 2018-08-24 MED FILL — VENLAFAXINE HCL ER 150 MG C: 150 | 30 days supply | Qty: 30 | Fill #2

## 2018-08-24 MED FILL — LEVOCETIRIZINE 5 MG TABLET: 5 | 30 days supply | Qty: 30 | Fill #0

## 2018-08-24 MED FILL — OMEPRAZOLE 20 MG CPDR: 20 | 90 days supply | Qty: 90 | Fill #0

## 2018-08-24 MED FILL — MONTELUKAST SOD 10 MG TAB: 10 | 30 days supply | Qty: 30 | Fill #2

## 2018-09-19 MED FILL — VENLAFAXINE HCL ER 150 MG C: 150 | 30 days supply | Qty: 30 | Fill #3

## 2018-09-19 MED FILL — MONTELUKAST SOD 10 MG TAB: 10 | 30 days supply | Qty: 30 | Fill #3

## 2018-09-19 MED FILL — VENLAFAXINE HCL ER 37.5 MG: 37.5 | 30 days supply | Qty: 30 | Fill #1

## 2018-09-20 MED FILL — LEVOCETIRIZINE 5 MG TABLET: 5 | 30 days supply | Qty: 30 | Fill #0

## 2018-09-24 MED FILL — IBUPROFEN 600 MG TABLET: 600 | 10 days supply | Qty: 30 | Fill #0

## 2018-09-24 MED FILL — predniSONE 10 MG TABS: 10 | 5 days supply | Qty: 5 | Fill #0

## 2018-09-24 MED FILL — AZITHROMYCIN 250 MG TABS: 250 | 5 days supply | Qty: 6 | Fill #0

## 2018-09-24 MED FILL — NEO/POLYMYXIN/HC EAR SOLN: 3.5-10000-1 | 8 days supply | Qty: 10 | Fill #0

## 2018-10-15 MED FILL — MELOXICAM 7.5 MG TABLET: 7.5 | 15 days supply | Qty: 30 | Fill #0

## 2018-10-17 MED FILL — MONTELUKAST SOD 10 MG TAB: 10 | 30 days supply | Qty: 30 | Fill #4

## 2018-10-17 MED FILL — VENLAFAXINE HCL ER 150 MG C: 150 | 30 days supply | Qty: 30 | Fill #4

## 2018-10-17 MED FILL — VENLAFAXINE HCL ER 37.5 MG: 37.5 | 30 days supply | Qty: 30 | Fill #2

## 2018-10-17 MED FILL — LEVOCETIRIZINE 5 MG TABLET: 5 | 30 days supply | Qty: 30 | Fill #0

## 2018-11-09 MED FILL — FEMYNOR 0.25-35 MG-MCG TABS: 0.25-35 | 84 days supply | Qty: 84 | Fill #1

## 2018-11-09 MED FILL — LEVOCETIRIZINE 5 MG TABLET: 5 | 30 days supply | Qty: 30 | Fill #0

## 2018-11-15 MED FILL — OMEPRAZOLE 20 MG CPDR: 20 | 90 days supply | Qty: 90 | Fill #1

## 2018-11-23 MED FILL — VENLAFAXINE HCL ER 150 MG C: 150 | 30 days supply | Qty: 30 | Fill #5

## 2018-11-23 MED FILL — MONTELUKAST SOD 10 MG TAB: 10 | 30 days supply | Qty: 30 | Fill #5

## 2018-11-28 MED FILL — LEVOCETIRIZINE 5 MG TABLET: 5 | 90 days supply | Qty: 45 | Fill #0

## 2018-11-28 MED FILL — hydrOXYzine HCL 10 MG TABS: 10 | 30 days supply | Qty: 90 | Fill #0

## 2018-11-28 MED FILL — CHLORHEXIDINE 0.12% RINSE: 0.12 | 7 days supply | Qty: 473 | Fill #0

## 2018-11-28 MED FILL — VENLAFAXINE HCL ER 37.5 MG: 37.5 | 90 days supply | Qty: 90 | Fill #0

## 2018-12-20 MED FILL — VENLAFAXINE HCL ER 150 MG C: 150 | 30 days supply | Qty: 30 | Fill #0

## 2018-12-20 MED FILL — MONTELUKAST SOD 10 MG TAB: 10 | 90 days supply | Qty: 90 | Fill #0

## 2019-01-16 MED FILL — LEVOCETIRIZINE 5 MG TABLET: 5 | 90 days supply | Qty: 45 | Fill #1

## 2019-01-25 MED FILL — VENLAFAXINE HCL ER 150 MG C: 150 | 30 days supply | Qty: 30 | Fill #1

## 2019-01-31 MED FILL — NORGESTIMATE-ETH ESTRADIOL: 0.25-35 | 84 days supply | Qty: 84 | Fill #2

## 2019-02-18 MED FILL — OMEPRAZOLE 20 MG CAP: 20 | 90 days supply | Qty: 90 | Fill #0

## 2019-02-18 MED FILL — VENLAFAXINE HCL ER 150 MG C: 150 | 30 days supply | Qty: 30 | Fill #2

## 2019-03-14 MED FILL — VENLAFAXINE HCL ER 37.5 MG: 37.5 | 90 days supply | Qty: 90 | Fill #0

## 2019-03-14 MED FILL — LEVOCETIRIZINE 5 MG TABLET: 5 | 90 days supply | Qty: 45 | Fill #0

## 2019-03-25 MED FILL — VENLAFAXINE HCL ER 150 MG C: 150 | 30 days supply | Qty: 30 | Fill #0

## 2019-03-25 MED FILL — MONTELUKAST SOD 10 MG TAB: 10 | 90 days supply | Qty: 90 | Fill #0

## 2019-03-27 MED FILL — NEO/POLYMYXIN/DEXAMETH DROP: 3.5-10000-0 | 8 days supply | Qty: 5 | Fill #0

## 2019-04-08 MED FILL — MELOXICAM 15 MG TABLET: 15 | 90 days supply | Qty: 90 | Fill #0

## 2019-04-25 MED FILL — VENLAFAXINE HCL ER 150 MG C: 150 | 30 days supply | Qty: 30 | Fill #1

## 2019-04-26 MED FILL — LEVOCETIRIZINE 5 MG TABLET: 5 | 90 days supply | Qty: 45 | Fill #0

## 2019-04-26 MED FILL — NORGESTIMATE-ETH ESTRADIOL: 0.25-35 | 84 days supply | Qty: 84 | Fill #0

## 2019-05-22 MED FILL — OMEPRAZOLE 20 MG CAP: 20 | 90 days supply | Qty: 90 | Fill #0

## 2019-05-24 MED FILL — VENLAFAXINE HCL ER 150 MG C: 150 | 30 days supply | Qty: 30 | Fill #2

## 2019-06-06 ENCOUNTER — Emergency Department (HOSPITAL_COMMUNITY): Payer: Self-pay

## 2019-06-06 ENCOUNTER — Other Ambulatory Visit: Payer: Self-pay

## 2019-06-06 ENCOUNTER — Encounter (HOSPITAL_COMMUNITY): Payer: Self-pay | Admitting: Emergency Medicine

## 2019-06-06 ENCOUNTER — Emergency Department (HOSPITAL_COMMUNITY)
Admission: EM | Admit: 2019-06-06 | Discharge: 2019-06-06 | Disposition: A | Discharge status: Home / Self Care | Payer: Self-pay | Attending: Emergency Medicine | Admitting: Emergency Medicine

## 2019-06-06 DIAGNOSIS — Z79899 Other long term (current) drug therapy: Secondary | ICD-10-CM | POA: Insufficient documentation

## 2019-06-06 DIAGNOSIS — R0602 Shortness of breath: Secondary | ICD-10-CM | POA: Insufficient documentation

## 2019-06-06 DIAGNOSIS — J45909 Unspecified asthma, uncomplicated: Secondary | ICD-10-CM | POA: Insufficient documentation

## 2019-06-06 DIAGNOSIS — Z20822 Contact with and (suspected) exposure to covid-19: Secondary | ICD-10-CM | POA: Insufficient documentation

## 2019-06-06 DIAGNOSIS — M791 Myalgia, unspecified site: Secondary | ICD-10-CM | POA: Insufficient documentation

## 2019-06-06 NOTE — ED Triage Notes (Signed)
Patient presents with fevers, SOB, cough, fatigue and general pain. Symptoms started after husband was found covid positive on 05/31/19.    EMS vitals: 113/74 BP 96 HR 18 RR 98% O2 sat on room air

## 2019-06-06 NOTE — ED Provider Notes (Signed)
[facility] DEPT Provider Note   CSN: [id number] Arrival date & time: 06/06/19  1516     History No chief complaint on file.   Patty Stanley is a 39 y.o. female.  Patient presenting with some fever shortness of breath cough fatigue body aches.  And some congestion.  Patient's symptoms started the day after her husband tested positive for COVID-19 which was on January 15.  Patient with has some shortness of breath mostly just with cough but otherwise not.  Oxygen saturations here 100%.  Temp was 99.2.  No children at home.        Past Medical History:  Diagnosis Date  . Alcohol abuse   . ALLERGIC RHINITIS   . Anxiety   . ASTHMA   . DEPRESSION   . Headache(784.0)   . OSTEOARTHRITIS   . UNSPECIFIED TACHYCARDIA     Patient Active Problem List   Diagnosis Date Noted  . RLS (restless legs syndrome) 11/29/2012  . Routine general medical examination at a health care facility 11/29/2012  . Obesity (BMI 30.0-34.9) 11/29/2012  . Need for pneumococcal vaccination 11/29/2012  . GERD (gastroesophageal reflux disease) 10/14/2011  . Low back pain 01/27/2011  . Depression with anxiety 04/19/2010  . ALLERGIC RHINITIS 04/19/2010  . Asthma, intermittent 04/19/2010  . OSTEOARTHRITIS 04/19/2010    Past Surgical History:  Procedure Laterality Date  . WISDOM TOOTH EXTRACTION       OB History   No obstetric history on file.     Family History  Adopted: Yes  Problem Relation Age of Onset  . Alcohol abuse Other   . Arthritis Other     Social History   Tobacco Use  . Smoking status: Never Smoker  . Smokeless tobacco: Never Used  Substance Use Topics  . Alcohol use: No  . Drug use: No    Home Medications Prior to Admission medications   Medication Sig Start Date End Date Taking? Authorizing Provider  acetaminophen (TYLENOL) 500 MG tablet Take 2 tablets (1,000 mg total) by mouth once. Patient taking differently: Take 1,000 mg by mouth  every 6 (six) hours as needed for moderate pain.  08/18/14   Copland, Gay Filler, MD  acetaminophen-codeine (TYLENOL #3) 300-30 MG per tablet Take 1 tablet by mouth every 4 (four) hours as needed for moderate pain or severe pain.    [provider]  albuterol (PROVENTIL HFA) 108 (90 BASE) MCG/ACT inhaler Inhale 2 puffs into the lungs every 6 (six) hours as needed for wheezing. 12/11/12   Janith Lima, MD  beclomethasone (QVAR) 80 MCG/ACT inhaler Inhale 1 puff into the lungs 2 (two) times daily. Patient taking differently: Inhale 1 puff into the lungs 2 (two) times daily as needed (asthma).  12/11/12 09/16/15  Janith Lima, MD  cetirizine (ZYRTEC) 10 MG tablet Take 10 mg by mouth daily.    [provider]  clonazePAM (KLONOPIN) 1 MG tablet Take 1 tablet (1 mg total) by mouth 2 (two) times daily as needed for anxiety. 11/29/12   Janith Lima, MD  desloratadine (CLARINEX) 5 MG tablet Take 1 tablet (5 mg total) by mouth daily. Patient not taking: Reported on 12/17/2014 12/11/12   Janith Lima, MD  doxycycline (VIBRAMYCIN) 100 MG capsule Take 1 capsule (100 mg total) by mouth 2 (two) times daily. 12/17/14   Tanna Furry, MD  HYDROcodone-acetaminophen (NORCO/VICODIN) 5-325 MG per tablet Take 2 tablets by mouth every 4 (four) hours as needed. 12/17/14   Jeneen Rinks,  Elta Guadeloupe, MD  mometasone (NASONEX) 50 MCG/ACT nasal spray Place 24 sprays into the nose daily. Patient not taking: Reported on 08/20/2014 12/11/12 12/11/13  Janith Lima, MD  montelukast (SINGULAIR) 10 MG tablet Take 1 tablet (10 mg total) by mouth daily. 11/26/10   Janith Lima, MD  omeprazole (PRILOSEC) 40 MG capsule Take 40 mg by mouth daily.    [provider]  ranitidine (ZANTAC) 150 MG tablet Take 150 mg by mouth daily.    [provider]  venlafaxine (EFFEXOR) 37.5 MG tablet Take 37.5 mg by mouth daily.     [provider]  venlafaxine XR (EFFEXOR-XR) 150 MG 24 hr capsule Take 1 capsule (150 mg total) by  mouth daily. Patient taking differently: Take 150 mg by mouth daily. Take along with 37.5 mg. 12/11/12   Janith Lima, MD    Allergies    Penicillins  Review of Systems   Review of Systems  Constitutional: Positive for fatigue and fever. Negative for chills.  HENT: Positive for congestion. Negative for rhinorrhea and sore throat.   Eyes: Negative for visual disturbance.  Respiratory: Positive for cough and shortness of breath.   Cardiovascular: Negative for chest pain and leg swelling.  Gastrointestinal: Negative for abdominal pain, diarrhea, nausea and vomiting.  Genitourinary: Negative for dysuria.  Musculoskeletal: Positive for myalgias. Negative for back pain and neck pain.  Skin: Negative for rash.  Neurological: Negative for dizziness, light-headedness and headaches.  Hematological: Does not bruise/bleed easily.  Psychiatric/Behavioral: Negative for confusion.    Physical Exam Updated Vital Signs BP 105/82 (BP Location: Left Arm)   Pulse 89   Temp 99.2 F (37.3 C) (Oral)   Resp 18   SpO2 100%   Physical Exam Vitals and nursing note reviewed.  Constitutional:      General: She is not in acute distress.    Appearance: Normal appearance. She is well-developed.  HENT:     Head: Normocephalic and atraumatic.  Eyes:     Conjunctiva/sclera: Conjunctivae normal.     Pupils: Pupils are equal, round, and reactive to light.  Cardiovascular:     Rate and Rhythm: Normal rate and regular rhythm.     Heart sounds: No murmur.  Pulmonary:     Effort: Pulmonary effort is normal. No respiratory distress.     Breath sounds: Normal breath sounds.  Abdominal:     Palpations: Abdomen is soft.     Tenderness: There is no abdominal tenderness.  Musculoskeletal:        General: Normal range of motion.     Cervical back: Normal range of motion and neck supple.  Skin:    General: Skin is warm and dry.     Capillary Refill: Capillary refill takes less than 2 seconds.   Neurological:     General: No focal deficit present.     Mental Status: She is alert and oriented to person, place, and time.     ED Results / Procedures / Treatments   Labs (all labs ordered are listed, but only abnormal results are displayed) Labs Reviewed - No data to display  EKG None  Radiology DG Chest [facility] 1 View  Result Date: 06/06/2019 CLINICAL DATA:  Shortness of breath and history of prior COVID-19 positivity EXAM: PORTABLE CHEST 1 VIEW COMPARISON:  02/24/2011 FINDINGS: The heart size and mediastinal contours are within normal limits. Both lungs are clear. The visualized skeletal structures are unremarkable. IMPRESSION: No active disease. Electronically Signed   By: Elta Guadeloupe  Lukens M.D.   On: 06/06/2019 16:49    Procedures Procedures (including critical care time)  Medications Ordered in ED Medications - No data to display  ED Course  I have reviewed the triage vital signs and the nursing notes.  Pertinent labs & imaging results that were available during my care of the patient were reviewed by me and considered in my medical decision making (see chart for details).    MDM Rules/Calculators/A&P                      Patient symptoms consistent with COVID-19 infection particular husband being tested positive.  Patient nontoxic no acute distress oxygen saturations 100% not tachycardic not hypotensive not tachypneic chest x-ray is negative for any acute findings.  Patient stable for discharge home with precautions.  Did offer patient to have formal Covid testing but she states that she does not want that she is pretty certain that is what she has since her husband has a positive test.  Patient will be continue with Tylenol and a cough suppressant that she is taking at home.  Patient will return for any new or worse symptoms particularly any worsening shortness of breath.    Final Clinical Impression(s) / ED Diagnoses Final diagnoses:  Suspected COVID-19 virus  infection    Rx / DC Orders ED Discharge Orders    None       Fredia Sorrow, MD 06/06/19 1724

## 2019-06-06 NOTE — Discharge Instructions (Addendum)
Symptoms very suggestive of COVID-19 infection and particularly with your husband being positive.  Most likely is.  Return for any new or worse symptoms particularly with breathing.  Recommend Tylenol and a cough suppressant medicine.  Definitely return for any worsening shortness of breath.

## 2019-06-20 MED FILL — VENLAFAXINE HCL ER 37.5 MG: 37.5 | 90 days supply | Qty: 90 | Fill #0

## 2019-06-25 MED FILL — BENZONATATE 100 MG CAPS: 100 | 10 days supply | Qty: 30 | Fill #0

## 2019-06-25 MED FILL — DICLOFENAC SODIUM 1 % GEL: 1 | 20 days supply | Qty: 100 | Fill #0

## 2019-06-25 MED FILL — LEVOCETIRIZINE 5 MG TABLET: 5 | 90 days supply | Qty: 45 | Fill #0

## 2019-06-25 MED FILL — hydrOXYzine HCL 10 MG TABS: 10 | 30 days supply | Qty: 90 | Fill #0

## 2019-07-03 MED FILL — VENLAFAXINE HCL ER 150 MG C: 150 | 30 days supply | Qty: 30 | Fill #3

## 2019-07-19 MED FILL — MELOXICAM 15 MG TABLET: 15 | 90 days supply | Qty: 90 | Fill #0

## 2019-07-19 MED FILL — MONTELUKAST SOD 10 MG TAB: 10 | 90 days supply | Qty: 90 | Fill #1

## 2019-07-19 MED FILL — VYLIBRA 0.25-35 MG-MCG TABS: 0.25-35 | 84 days supply | Qty: 84 | Fill #1

## 2019-07-22 MED FILL — MELOXICAM 15 MG TABLET: 15 | 90 days supply | Qty: 90 | Fill #0

## 2019-07-22 MED FILL — VYLIBRA 0.25-35 MG-MCG TABS: 0.25-35 | 84 days supply | Qty: 84 | Fill #1

## 2019-07-31 MED FILL — VENLAFAXINE HCL ER 150 MG C: 150 | 30 days supply | Qty: 30 | Fill #4

## 2019-08-14 MED FILL — LEVOCETIRIZINE 5 MG TABLET: 5 | 90 days supply | Qty: 45 | Fill #0

## 2019-08-15 MED FILL — hydrOXYzine HCL 10 MG TABS: 10 | 30 days supply | Qty: 60 | Fill #0

## 2019-08-29 MED FILL — OMEPRAZOLE 20 MG CAP: 20 | 90 days supply | Qty: 90 | Fill #0

## 2019-08-30 MED FILL — VENLAFAXINE HCL ER 150 MG C: 150 | 30 days supply | Qty: 30 | Fill #0

## 2019-10-01 MED FILL — hydrOXYzine HCL 10 MG TABS: 10 | 30 days supply | Qty: 60 | Fill #0

## 2019-10-04 MED FILL — hydrOXYzine HCL 25 MG TABS: 25 | 30 days supply | Qty: 60 | Fill #0

## 2019-10-17 MED FILL — hydrOXYzine HCL 25 MG TABS: 25 | 30 days supply | Qty: 90 | Fill #0

## 2019-10-17 MED FILL — SERTRALINE HCL 50 MG TABS: 50 | 30 days supply | Qty: 30 | Fill #0

## 2019-10-18 MED FILL — VENLAFAXINE HCL ER 75 MG CA: 75 | 30 days supply | Qty: 30 | Fill #0

## 2019-10-25 ENCOUNTER — Other Ambulatory Visit (HOSPITAL_COMMUNITY): Payer: Self-pay | Admitting: Family Medicine

## 2019-11-01 MED FILL — SERTRALINE HCL 100 MG TAB: 100 | 30 days supply | Qty: 30 | Fill #0

## 2019-11-01 MED FILL — hydrOXYzine HCL 25 MG TABS: 25 | 30 days supply | Qty: 60 | Fill #0

## 2019-11-11 MED FILL — SERTRALINE HCL 100 MG TAB: 100 | 30 days supply | Qty: 30 | Fill #1

## 2019-11-11 MED FILL — hydrOXYzine HCL 25 MG TABS: 25 | 30 days supply | Qty: 60 | Fill #1

## 2019-11-25 MED FILL — MELOXICAM 7.5 MG TABLET: 7.5 | 15 days supply | Qty: 30 | Fill #0

## 2019-11-25 MED FILL — OMEPRAZOLE 20 MG CAP: 20 | 90 days supply | Qty: 90 | Fill #0

## 2019-11-26 MED FILL — SERTRALINE HCL 50 MG TABS: 50 | 90 days supply | Qty: 90 | Fill #0

## 2019-11-28 MED FILL — hydrOXYzine HCL 25 MG TABS: 25 | 10 days supply | Qty: 30 | Fill #0

## 2019-12-10 MED FILL — hydrOXYzine HCL 25 MG TABS: 25 | 30 days supply | Qty: 60 | Fill #0

## 2019-12-24 ENCOUNTER — Other Ambulatory Visit: Payer: Self-pay

## 2019-12-24 ENCOUNTER — Ambulatory Visit (INDEPENDENT_AMBULATORY_CARE_PROVIDER_SITE_OTHER): Payer: No Payment, Other | Admitting: Psychiatry

## 2019-12-24 ENCOUNTER — Encounter (HOSPITAL_COMMUNITY): Payer: Self-pay | Admitting: Psychiatry

## 2019-12-24 DIAGNOSIS — F79 Unspecified intellectual disabilities: Secondary | ICD-10-CM

## 2019-12-24 DIAGNOSIS — F418 Other specified anxiety disorders: Secondary | ICD-10-CM | POA: Diagnosis not present

## 2019-12-24 MED ORDER — SERTRALINE HCL 100 MG PO TABS: 100.0000 mg | ORAL_TABLET | Freq: Every day | ORAL | 2 refills | Status: DC

## 2019-12-24 MED ORDER — HYDROXYZINE HCL 25 MG PO TABS: 25.0000 mg | ORAL_TABLET | Freq: Three times a day (TID) | ORAL | 2 refills | Status: DC | PRN

## 2019-12-24 MED FILL — SERTRALINE HCL 100 MG TABS: 100 | 30 days supply | Qty: 30 | Fill #0

## 2019-12-24 MED FILL — HYDROXYZINE HCL 25 MG TABS: 25 | 30 days supply | Qty: 90 | Fill #0

## 2019-12-24 NOTE — Progress Notes (Signed)
Psychiatric Initial Adult Assessment   Patient Identification: Patty Stanley MRN:  [id number] Date of Evaluation:  12/24/2019   Referral Source: [facility] [place]  Chief Complaint:   " My provider is not working there anymore."  Visit Diagnosis:    ICD-10-CM   1. Depression with anxiety  F41.8   2. Unspecified intellectual disabilities  F79     History of Present Illness: This is a 39 year old female with history of MDD, anxiety and unspecified intellectual disabilities now presenting with her adoptive mother for establishing care.  Mother informed that patient was adopted when she was 60 and half months old.  Patient was diagnosed with depression and anxiety when she was in eighth grade.  She was started on Effexor at that time.  Mother informed that patient has always struggled academically.  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  She was hired several times by different agencies however was fired from all the places that she could not hold the job together due to several issues. Patient eventually met and married her husband who is 1 years old now.  Patient stated that she is the primary caretaker for him as her husband has several different health conditions.  Patient informed that her husband has been advised not to drive and she is the one who drives him to his appointments and other places. Mother informed that patient continue taking Effexor until about a few months ago when it stopped being helpful.  She was gradually tapered off and was switched to sertraline.  She has been on the current dose of sertraline for about a month now.  Hydroxyzine was eventually added to help with breakthrough anxiety.  Patient stated that she and her husband contracted Covid earlier this year and since then she has noticed that she has significant anxiety and therefore she has been using the hydroxyzine 3 times a day  regularly.  Patient stated that her husband feels that she is doing better now and she is not as grouchy as she used to be.  Her mother stated that patient seems to be doing fairly well in terms of her mood and anxiety.  Patient informed that since her husband used to work current shifts to go to bed quite late and then be complete in the morning.   Mother stated that her main concern is that patient is addicted to soda.  She stated that she drinks very large quantities of soda which is full of caffeine and sugar throughout the day.  Mother stated that she believes this is addiction and wants to know if there is anything that can help the patient with that. Patient informed that she drinks about 12 cans of soda on a regular basis.  She denied any auditory or visual hallucinations.  She denied any paranoid delusions. She denied any symptom suggestive of mania or hypomania.  She denied any excessive consumption of alcohol or illicit use of drugs.  Past Psychiatric History: MDD, anxiety, learning disabilities in school  Previous Psychotropic Medications: Yes , Effexor, Clonazepam  Substance Abuse History in the last 12 months:  No.  Consequences of Substance Abuse: NA  Past Medical History:  Past Medical History:  Diagnosis Date   Alcohol abuse    ALLERGIC RHINITIS    Anxiety    ASTHMA    DEPRESSION    Headache(784.0)    OSTEOARTHRITIS    UNSPECIFIED TACHYCARDIA     Past Surgical History:  Procedure Laterality Date   WISDOM TOOTH EXTRACTION      Family Psychiatric History: denied, adopted  Family History:  Family History  Adopted: Yes  Problem Relation Age of Onset   Alcohol abuse Other    Arthritis Other     Social History:   Social History   Socioeconomic History   Marital status: Married    Spouse name: Not on file   Number of children: Not on file   Years of education: Not on file   Highest education level: Not on file  Occupational History    Occupation: [employer]    Employer: [employer]  Tobacco Use   Smoking status: Never Smoker   Smokeless tobacco: Never Used  Substance and Sexual Activity   Alcohol use: No   Drug use: No   Sexual activity: Yes    Birth control/protection: Pill  Other Topics Concern   Not on file  Social History Narrative   Not on file   Social Determinants of Health   Financial Resource Strain:    Difficulty of Paying Living Expenses:   Food Insecurity:    Worried About Charity fundraiser in the Last Year:    Arboriculturist in the Last Year:   Transportation Needs:    Film/video editor (Medical):    Lack of Transportation (Non-Medical):   Physical Activity:    Days of Exercise per Week:    Minutes of Exercise per Session:   Stress:    Feeling of Stress :   Social Connections:    Frequency of Communication with Friends and Family:    Frequency of Social Gatherings with Friends and Family:    Attends Religious Services:    Active Member of Clubs or Organizations:    Attends Archivist Meetings:    Marital Status:     Additional Social History: Lives with her husband, no children  Allergies:   Allergies  Allergen Reactions   Penicillins     REACTION: Itching    Metabolic Disorder Labs: Lab Results  Component Value Date   HGBA1C 5.7 11/29/2012   No results found for: PROLACTIN Lab Results  Component Value Date   CHOL 167 11/29/2012   TRIG 113.0 11/29/2012   HDL 49.20 11/29/2012   CHOLHDL 3 11/29/2012   VLDL 22.6 11/29/2012   LDLCALC 95 11/29/2012   Lab Results  Component Value Date   TSH 1.12 11/29/2012    Therapeutic Level Labs: No results found for: LITHIUM No results found for: CBMZ No results found for: VALPROATE  Current Medications: Current Outpatient Medications  Medication Sig Dispense Refill   acetaminophen (TYLENOL) 500 MG tablet Take 2 tablets (1,000 mg total) by mouth once. (Patient taking differently: Take  1,000 mg by mouth every 6 (six) hours as needed for moderate pain. ) 30 tablet 0   acetaminophen-codeine (TYLENOL #3) 300-30 MG per tablet Take 1 tablet by mouth every 4 (four) hours as needed for moderate pain or severe pain.     albuterol (PROVENTIL HFA) 108 (90 BASE) MCG/ACT inhaler Inhale 2 puffs into the lungs every 6 (six) hours as needed for wheezing. 3 Inhaler 3   beclomethasone (QVAR) 80 MCG/ACT inhaler Inhale 1 puff into the lungs 2 (two) times daily. (Patient taking differently: Inhale 1 puff into the lungs 2 (two) times daily as needed (asthma). ) 3 Inhaler 3   cetirizine (ZYRTEC) 10 MG tablet Take 10 mg by mouth daily.     desloratadine (CLARINEX)  5 MG tablet Take 1 tablet (5 mg total) by mouth daily. (Patient not taking: Reported on 12/17/2014) 90 tablet 3   HYDROcodone-acetaminophen (NORCO/VICODIN) 5-325 MG per tablet Take 2 tablets by mouth every 4 (four) hours as needed. 10 tablet 0   mometasone (NASONEX) 50 MCG/ACT nasal spray Place 24 sprays into the nose daily. (Patient not taking: Reported on 08/20/2014) 51 g 3   montelukast (SINGULAIR) 10 MG tablet Take 1 tablet (10 mg total) by mouth daily. 90 tablet 3   omeprazole (PRILOSEC) 40 MG capsule Take 40 mg by mouth daily.     ranitidine (ZANTAC) 150 MG tablet Take 150 mg by mouth daily.     No current facility-administered medications for this visit.    Musculoskeletal: Strength & Muscle Tone: within normal limits Gait & Station: normal Patient leans: N/A  Psychiatric Specialty Exam: Review of Systems  There were no vitals taken for this visit.There is no height or weight on file to calculate BMI.  General Appearance: Fairly Groomed  Eye Contact:  Good  Speech:  Clear and Coherent and Normal Rate  Volume:  Normal  Mood:  Euthymic  Affect:  Constricted  Thought Process:  Goal Directed and Descriptions of Associations: Intact  Orientation:  Full (Time, Place, and Person)  Thought Content:  Logical  Suicidal  Thoughts:  No  Homicidal Thoughts:  No  Memory:  Immediate;   Good Recent;   Good  Judgement:  Fair  Insight:  Fair  Psychomotor Activity:  Normal  Concentration:  Concentration: Good and Attention Span: Good  Recall:  Good  Fund of Knowledge:Good  Language: Good  Akathisia:  No  Handed:  Right  AIMS (if indicated):  0  Assets:  Communication Skills Desire for Improvement Financial Resources/Insurance Housing Social Support  ADL's:  Intact  Cognition: Impaired,  Mild  Sleep:  Good   Screenings: PHQ2-9     Office Visit from 09/05/2014 in Primary Care at [place] from 08/25/2014 in Primary Care at [facility] Total Score 0 0      Assessment and Plan: Patient with history of depression and anxiety and specified intellectual disabilities now seen for establishing care.  She came along with her adoptive mother.  Patient was previously managed on Effexor for many years and was switched to sertraline earlier this year.  She has been on the current dose of sertraline for about a month now and also takes hydroxyzine 25 mg 3 times daily as needed.  Patient and mother feel she is doing well for now.  We will continue the same regimen for now.  1. Depression with anxiety  - sertraline (ZOLOFT) 100 MG tablet; Take 1 tablet (100 mg total) by mouth daily.  Dispense: 30 tablet; Refill: 2 - hydrOXYzine (ATARAX/VISTARIL) 25 MG tablet; Take 1 tablet (25 mg total) by mouth 3 (three) times daily as needed for anxiety.  Dispense: 90 tablet; Refill: 2  2. Unspecified intellectual disabilities  Continue same medication regimen. Patient is scheduled to be starting therapy with a new therapist at [facility] [place]. Follow up in 2 months.     N[place]ada Crane, MD 8/10/20211:52 PM

## 2019-12-27 MED FILL — LEVOCETIRIZINE 5 MG TABLET: 5 | 90 days supply | Qty: 45 | Fill #1

## 2020-01-24 MED FILL — SERTRALINE HCL 100 MG TABS: 100 | 30 days supply | Qty: 30 | Fill #1

## 2020-01-24 MED FILL — HYDROXYZINE HCL 25 MG TABS: 25 | 30 days supply | Qty: 90 | Fill #1

## 2020-01-24 MED FILL — MONTELUKAST SOD 10 MG TAB: 10 | 90 days supply | Qty: 90 | Fill #0

## 2020-02-13 MED FILL — HYDROXYZINE HCL 25 MG TABS: 25 | 30 days supply | Qty: 60 | Fill #1

## 2020-02-15 IMAGING — DX DG CHEST 1V PORT
1 series · 1 of 1 positions shown · non-contrast
Comparison: 02/24/2011

CLINICAL DATA: Shortness of breath and history of prior [id number]
positivity

EXAM:
PORTABLE CHEST 1 VIEW

[chest ap]
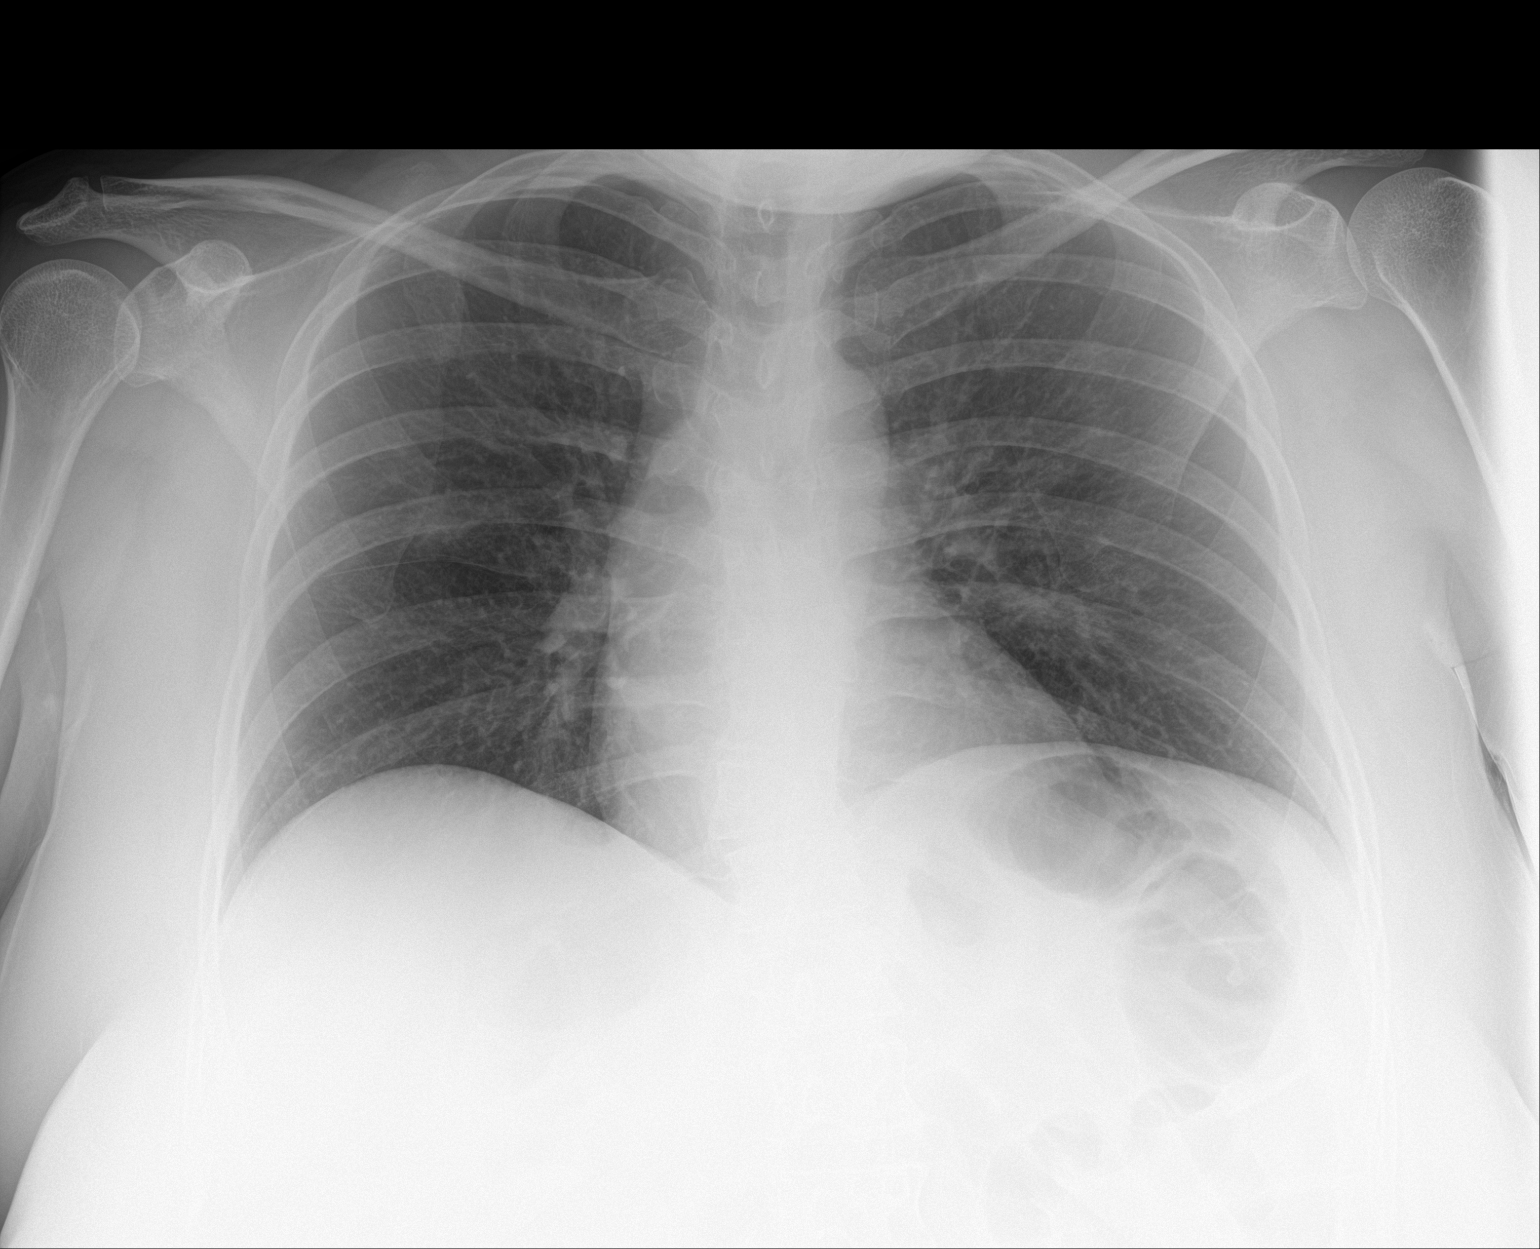

[1 of 1 positions shown; findings below may reference images not displayed]

FINDINGS: The heart size and mediastinal contours are within normal limits.
Both lungs are clear. The visualized skeletal structures are
unremarkable.
IMPRESSION: No active disease.

## 2020-02-18 ENCOUNTER — Other Ambulatory Visit: Payer: Self-pay

## 2020-02-18 ENCOUNTER — Ambulatory Visit (INDEPENDENT_AMBULATORY_CARE_PROVIDER_SITE_OTHER): Payer: No Payment, Other | Admitting: Psychiatry

## 2020-02-18 ENCOUNTER — Encounter (HOSPITAL_COMMUNITY): Payer: Self-pay | Admitting: Psychiatry

## 2020-02-18 ENCOUNTER — Other Ambulatory Visit (HOSPITAL_COMMUNITY): Payer: Self-pay | Admitting: Psychiatry

## 2020-02-18 VITALS — BP 124/92 | HR 98 | Temp 98.2°F | Ht 59.0 in | Wt 173.0 lb

## 2020-02-18 DIAGNOSIS — F418 Other specified anxiety disorders: Secondary | ICD-10-CM | POA: Diagnosis not present

## 2020-02-18 DIAGNOSIS — F79 Unspecified intellectual disabilities: Secondary | ICD-10-CM | POA: Diagnosis not present

## 2020-02-18 MED ORDER — SERTRALINE HCL 100 MG PO TABS: 100.0000 mg | ORAL_TABLET | Freq: Every day | ORAL | 2 refills | Status: DC

## 2020-02-18 MED ORDER — HYDROXYZINE HCL 25 MG PO TABS: 25.0000 mg | ORAL_TABLET | Freq: Three times a day (TID) | ORAL | 2 refills | Status: DC | PRN

## 2020-02-18 MED FILL — SERTRALINE HCL 100 MG TABS: 100 | 30 days supply | Qty: 30 | Fill #0

## 2020-02-18 MED FILL — HYDROXYZINE HCL 25 MG TABS: 25 | 30 days supply | Qty: 90 | Fill #0

## 2020-02-18 NOTE — Progress Notes (Signed)
[place] MD/PA/NP OP Progress Note  02/18/2020 3:05 PM Patty Stanley  MRN:  [id number]  Chief Complaint:  Chief Complaint    Medication Management     HPI: Patient reported that she is dealing with her husband's health conditions.  She stated that now her husband has also developed gallbladder issues on top of other medical diagnoses.  She stated that surgeon is recommending surgery however he needs to get clearance from his cardiologist and pulmonologist before that.  She stated that sometimes things get really overwhelming and she forgets to eat.  She stated that when she forgets to eat the whole day she feels distracted and forgetful. She stated that she takes hydroxyzine for anxiety and that helps and sometimes she takes a nap after she takes it in the afternoon which is helpful.   When asked how she takes care of herself when she gets too stressed she informed that she likes to stitch masks.  She stated that she learned stitching when she was younger and she loves to stitch masks for adults.  She pointed at the 1 that she was wearing. When asked what per her home setting in for the next year she stated that she wants her husband to feel better so that she does not have some doctors appointments to take him to.  Visit Diagnosis:    ICD-10-CM   1. Depression with anxiety  F41.8   2. Unspecified intellectual disabilities  F79     Past Psychiatric History: MDD, ID  Past Medical History:  Past Medical History:  Diagnosis Date  . Alcohol abuse   . ALLERGIC RHINITIS   . Anxiety   . ASTHMA   . DEPRESSION   . Headache(784.0)   . OSTEOARTHRITIS   . UNSPECIFIED TACHYCARDIA     Past Surgical History:  Procedure Laterality Date  . WISDOM TOOTH EXTRACTION      Family Psychiatric History: Adopted  Family History:  Family History  Adopted: Yes  Problem Relation Age of Onset  . Alcohol abuse Other   . Arthritis Other     Social History:  Social History   Socioeconomic History   . Marital status: Married    Spouse name: Not on file  . Number of children: Not on file  . Years of education: Not on file  . Highest education level: Not on file  Occupational History  . Occupation: Barrister's clerk: [employer]  Tobacco Use  . Smoking status: Never Smoker  . Smokeless tobacco: Never Used  Substance and Sexual Activity  . Alcohol use: No  . Drug use: No  . Sexual activity: Yes    Birth control/protection: Pill  Other Topics Concern  . Not on file  Social History Narrative  . Not on file   Social Determinants of Health   Financial Resource Strain:   . Difficulty of Paying Living Expenses: Not on file  Food Insecurity:   . Worried About Charity fundraiser in the Last Year: Not on file  . Ran Out of Food in the Last Year: Not on file  Transportation Needs:   . Lack of Transportation (Medical): Not on file  . Lack of Transportation (Non-Medical): Not on file  Physical Activity:   . Days of Exercise per Week: Not on file  . Minutes of Exercise per Session: Not on file  Stress:   . Feeling of Stress : Not on file  Social Connections:   . Frequency of Communication with Friends  and Family: Not on file  . Frequency of Social Gatherings with Friends and Family: Not on file  . Attends Religious Services: Not on file  . Active Member of Clubs or Organizations: Not on file  . Attends Archivist Meetings: Not on file  . Marital Status: Not on file    Allergies:  Allergies  Allergen Reactions  . Penicillins Hives    REACTION: Itching    Metabolic Disorder Labs: Lab Results  Component Value Date   HGBA1C 5.7 11/29/2012   No results found for: PROLACTIN Lab Results  Component Value Date   CHOL 167 11/29/2012   TRIG 113.0 11/29/2012   HDL 49.20 11/29/2012   CHOLHDL 3 11/29/2012   VLDL 22.6 11/29/2012   LDLCALC 95 11/29/2012   Lab Results  Component Value Date   TSH 1.12 11/29/2012   TSH 2.23 12/10/2010    Therapeutic  Level Labs: No results found for: LITHIUM No results found for: VALPROATE No components found for:  CBMZ  Current Medications: Current Outpatient Medications  Medication Sig Dispense Refill  . albuterol (PROVENTIL HFA) 108 (90 BASE) MCG/ACT inhaler Inhale 2 puffs into the lungs every 6 (six) hours as needed for wheezing. 3 Inhaler 3  . beclomethasone (QVAR) 80 MCG/ACT inhaler Inhale 1 puff into the lungs 2 (two) times daily. (Patient taking differently: Inhale 1 puff into the lungs 2 (two) times daily as needed (asthma). ) 3 Inhaler 3  . cetirizine (ZYRTEC) 10 MG tablet Take 10 mg by mouth daily.    Marland Kitchen desloratadine (CLARINEX) 5 MG tablet Take 1 tablet (5 mg total) by mouth daily. (Patient not taking: Reported on 12/17/2014) 90 tablet 3  . hydrOXYzine (ATARAX/VISTARIL) 25 MG tablet Take 1 tablet (25 mg total) by mouth 3 (three) times daily as needed for anxiety. 90 tablet 2  . mometasone (NASONEX) 50 MCG/ACT nasal spray Place 24 sprays into the nose daily. (Patient not taking: Reported on 08/20/2014) 51 g 3  . montelukast (SINGULAIR) 10 MG tablet Take 1 tablet (10 mg total) by mouth daily. 90 tablet 3  . omeprazole (PRILOSEC) 40 MG capsule Take 40 mg by mouth daily.    . sertraline (ZOLOFT) 100 MG tablet Take 1 tablet (100 mg total) by mouth daily. 30 tablet 2   No current facility-administered medications for this visit.     Musculoskeletal: Strength & Muscle Tone: within normal limits Gait & Station: normal Patient leans: N/A  Psychiatric Specialty Exam: Review of Systems  Blood pressure (!) 124/92, pulse 98, temperature 98.2 F (36.8 C), temperature source Oral, height 4\' 11"  (1.499 m), weight 173 lb (78.5 kg), SpO2 100 %.Body mass index is 34.94 kg/m.  General Appearance: Fairly Groomed  Eye Contact:  Good  Speech:  Clear and Coherent and Normal Rate  Volume:  Normal  Mood:  Euthymic  Affect:  Congruent  Thought Process:  Goal Directed and Descriptions of Associations: Intact   Orientation:  Full (Time, Place, and Person)  Thought Content: Logical   Suicidal Thoughts:  No  Homicidal Thoughts:  No  Memory:  Immediate;   Good Recent;   Good  Judgement:  Fair  Insight:  Fair  Psychomotor Activity:  Normal  Concentration:  Concentration: Good and Attention Span: Good  Recall:  Good  Fund of Knowledge: Good  Language: Good  Akathisia:  Negative  Handed:  Right  AIMS (if indicated): 0  Assets:  Communication Skills Desire for Improvement Financial Resources/Insurance Housing Social Support  ADL's:  Intact  Cognition: Impaired,  Mild  Sleep:  Fair   Screenings: PHQ2-9     Office Visit from 09/05/2014 in Primary Care at [place] from 08/25/2014 in Primary Care at [facility] Total Score 0 0       Assessment and Plan: Patient reported ongoing stress due to her husband's health conditions.  She hopes that he will feel better soon.  We will continue the same regimen for now. VV:YXAJL pressure (!) 124/92, pulse 98, temperature 98.2 F (36.8 C), temperature source Oral, height 4\' 11"  (1.499 m), weight 173 lb (78.5 kg), SpO2 100 %.    1. Depression with anxiety - sertraline (ZOLOFT) 100 MG tablet; Take 1 tablet (100 mg total) by mouth daily.  Dispense: 30 tablet; Refill: 2 - hydrOXYzine (ATARAX/VISTARIL) 25 MG tablet; Take 1 tablet (25 mg total) by mouth 3 (three) times daily as needed for anxiety.  Dispense: 90 tablet; Refill: 2  2. Unspecified intellectual disabilities   Continue same medication regimen. Follow up in 10 weeks.    [place] Crane, MD 02/18/2020, 3:05 PM

## 2020-02-19 ENCOUNTER — Ambulatory Visit (HOSPITAL_COMMUNITY): Payer: No Payment, Other | Admitting: Psychiatry

## 2020-02-24 ENCOUNTER — Other Ambulatory Visit (HOSPITAL_COMMUNITY): Payer: Self-pay | Admitting: Family Medicine

## 2020-02-24 MED FILL — VYLIBRA 0.25-35 MG-MCG TABS: 0.25-35 | 63 days supply | Qty: 84 | Fill #0

## 2020-02-25 ENCOUNTER — Ambulatory Visit (HOSPITAL_COMMUNITY): Payer: No Payment, Other | Admitting: Psychiatry

## 2020-02-27 MED FILL — OMEPRAZOLE 20 MG CAP: 20 | 90 days supply | Qty: 90 | Fill #0

## 2020-03-03 ENCOUNTER — Other Ambulatory Visit (HOSPITAL_COMMUNITY): Payer: Self-pay

## 2020-03-03 MED FILL — NEBIVOLOL HCL 5 MG TABS: 5 | 30 days supply | Qty: 30 | Fill #0

## 2020-03-03 MED FILL — DOXYCYCLINE HYCLATE 100 MG: 100 | 5 days supply | Qty: 10 | Fill #0

## 2020-03-19 MED FILL — LEVOCETIRIZINE 5 MG TABLET: 5 | 90 days supply | Qty: 90 | Fill #0

## 2020-03-19 MED FILL — SERTRALINE HCL 100 MG TABS: 100 | 30 days supply | Qty: 30 | Fill #1

## 2020-04-02 ENCOUNTER — Other Ambulatory Visit (HOSPITAL_COMMUNITY): Payer: Self-pay | Admitting: Family Medicine

## 2020-04-02 MED FILL — MELOXICAM 7.5 MG TABLET: 7.5 | 15 days supply | Qty: 30 | Fill #0

## 2020-04-02 MED FILL — CYCLOBENZAPRINE HCL 10 MG T: 10 | 15 days supply | Qty: 15 | Fill #0

## 2020-04-15 MED FILL — HYDROXYZINE HCL 25 MG TABS: 25 | 30 days supply | Qty: 90 | Fill #1

## 2020-04-15 MED FILL — NEBIVOLOL HCL 5 MG TABS: 5 | 30 days supply | Qty: 30 | Fill #1

## 2020-04-15 MED FILL — SERTRALINE HCL 100 MG TABS: 100 | 30 days supply | Qty: 30 | Fill #2

## 2020-04-27 ENCOUNTER — Encounter (HOSPITAL_COMMUNITY): Payer: Self-pay | Admitting: Psychiatry

## 2020-04-27 ENCOUNTER — Ambulatory Visit (INDEPENDENT_AMBULATORY_CARE_PROVIDER_SITE_OTHER): Payer: No Payment, Other | Admitting: Psychiatry

## 2020-04-27 ENCOUNTER — Other Ambulatory Visit: Payer: Self-pay

## 2020-04-27 ENCOUNTER — Other Ambulatory Visit (HOSPITAL_COMMUNITY): Payer: Self-pay | Admitting: Psychiatry

## 2020-04-27 VITALS — BP 125/72 | HR 87 | Ht 59.0 in | Wt 178.0 lb

## 2020-04-27 DIAGNOSIS — F79 Unspecified intellectual disabilities: Secondary | ICD-10-CM | POA: Diagnosis not present

## 2020-04-27 DIAGNOSIS — F418 Other specified anxiety disorders: Secondary | ICD-10-CM | POA: Diagnosis not present

## 2020-04-27 MED ORDER — SERTRALINE HCL 100 MG PO TABS: 100.0000 mg | ORAL_TABLET | Freq: Every day | ORAL | 2 refills | Status: DC

## 2020-04-27 MED ORDER — HYDROXYZINE HCL 25 MG PO TABS: 25.0000 mg | ORAL_TABLET | Freq: Three times a day (TID) | ORAL | 2 refills | Status: DC | PRN

## 2020-04-27 MED FILL — SERTRALINE HCL 100 MG TABS: 100 | 30 days supply | Qty: 30 | Fill #0

## 2020-04-27 MED FILL — HYDROXYZINE HCL 25 MG TABS: 25 | 30 days supply | Qty: 90 | Fill #0

## 2020-04-27 NOTE — Progress Notes (Signed)
[place] MD/PA/NP OP Progress Note  04/27/2020 3:26 PM Patty Stanley  MRN:  [id number]  Chief Complaint: " I am doing okay."  HPI: Patient stated that she is doing well for the most part.  She informed that she is still dealing with her husband's chronic illnesses.  She informed that she still makes masks at the lately she has been stitching quilts.  She informed that she took a class for quilt stitching.  She showed a picture of a quilt she had made at home.  She stated that she just tries to take 1 day at a time. She informed that her husband has developed some new problems including back pain.  She stated that she tries to be optimistic.  She also informed that she has a cat and she spends a lot of time playing with a cat.  She stated that the cat is like their child. Regarding plans for the holidays, she stated that she is not sure if they will be visiting his husband's mother and sister again, as they were there for Thanksgiving too.   Visit Diagnosis:    ICD-10-CM   1. Depression with anxiety  F41.8   2. Unspecified intellectual disabilities  F79     Past Psychiatric History: MDD, ID  Past Medical History:  Past Medical History:  Diagnosis Date  . Alcohol abuse   . ALLERGIC RHINITIS   . Anxiety   . ASTHMA   . DEPRESSION   . Headache(784.0)   . OSTEOARTHRITIS   . UNSPECIFIED TACHYCARDIA     Past Surgical History:  Procedure Laterality Date  . WISDOM TOOTH EXTRACTION      Family Psychiatric History: Adopted  Family History:  Family History  Adopted: Yes  Problem Relation Age of Onset  . Alcohol abuse Other   . Arthritis Other     Social History:  Social History   Socioeconomic History  . Marital status: Married    Spouse name: Not on file  . Number of children: Not on file  . Years of education: Not on file  . Highest education level: Not on file  Occupational History  . Occupation: Barrister's clerk: [employer]  Tobacco Use  . Smoking status:  Never Smoker  . Smokeless tobacco: Never Used  Substance and Sexual Activity  . Alcohol use: No  . Drug use: No  . Sexual activity: Yes    Birth control/protection: Pill  Other Topics Concern  . Not on file  Social History Narrative  . Not on file   Social Determinants of Health   Financial Resource Strain: Not on file  Food Insecurity: Not on file  Transportation Needs: Not on file  Physical Activity: Not on file  Stress: Not on file  Social Connections: Not on file    Allergies:  Allergies  Allergen Reactions  . Penicillins Hives    REACTION: Itching    Metabolic Disorder Labs: Lab Results  Component Value Date   HGBA1C 5.7 11/29/2012   No results found for: PROLACTIN Lab Results  Component Value Date   CHOL 167 11/29/2012   TRIG 113.0 11/29/2012   HDL 49.20 11/29/2012   CHOLHDL 3 11/29/2012   VLDL 22.6 11/29/2012   LDLCALC 95 11/29/2012   Lab Results  Component Value Date   TSH 1.12 11/29/2012   TSH 2.23 12/10/2010    Therapeutic Level Labs: No results found for: LITHIUM No results found for: VALPROATE No components found for:  CBMZ  Current  Medications: Current Outpatient Medications  Medication Sig Dispense Refill  . albuterol (PROVENTIL HFA) 108 (90 BASE) MCG/ACT inhaler Inhale 2 puffs into the lungs every 6 (six) hours as needed for wheezing. 3 Inhaler 3  . cetirizine (ZYRTEC) 10 MG tablet Take 10 mg by mouth daily.    Marland Kitchen desloratadine (CLARINEX) 5 MG tablet Take 1 tablet (5 mg total) by mouth daily. 90 tablet 3  . hydrOXYzine (ATARAX/VISTARIL) 25 MG tablet Take 1 tablet (25 mg total) by mouth 3 (three) times daily as needed for anxiety. 90 tablet 2  . montelukast (SINGULAIR) 10 MG tablet Take 1 tablet (10 mg total) by mouth daily. 90 tablet 3  . omeprazole (PRILOSEC) 40 MG capsule Take 40 mg by mouth daily.    . sertraline (ZOLOFT) 100 MG tablet Take 1 tablet (100 mg total) by mouth daily. 30 tablet 2  . beclomethasone (QVAR) 80 MCG/ACT inhaler  Inhale 1 puff into the lungs 2 (two) times daily. (Patient taking differently: Inhale 1 puff into the lungs 2 (two) times daily as needed (asthma). ) 3 Inhaler 3  . mometasone (NASONEX) 50 MCG/ACT nasal spray Place 24 sprays into the nose daily. (Patient not taking: Reported on 08/20/2014) 51 g 3   No current facility-administered medications for this visit.     Musculoskeletal: Strength & Muscle Tone: within normal limits Gait & Station: normal Patient leans: N/A  Psychiatric Specialty Exam: Review of Systemsl  Blood pressure 125/72, pulse 87, height 4\' 11"  (1.499 m), weight 178 lb (80.7 kg), SpO2 100 %.Body mass index is 35.95 kg/m.  General Appearance: Fairly Groomed  Eye Contact:  Good  Speech:  Clear and Coherent and Normal Rate  Volume:  Normal  Mood:  Euthymic  Affect:  Congruent  Thought Process:  Goal Directed and Descriptions of Associations: Intact  Orientation:  Full (Time, Place, and Person)  Thought Content: Logical   Suicidal Thoughts:  No  Homicidal Thoughts:  No  Memory:  Immediate;   Good Recent;   Good  Judgement:  Fair  Insight:  Fair  Psychomotor Activity:  Normal  Concentration:  Concentration: Good and Attention Span: Good  Recall:  Good  Fund of Knowledge: Good  Language: Good  Akathisia:  Negative  Handed:  Right  AIMS (if indicated): 0  Assets:  Communication Skills Desire for Improvement Financial Resources/Insurance Housing Social Support  ADL's:  Intact  Cognition: Impaired,  Mild  Sleep:  Fair   Screenings: PHQ2-9   [place] Office Visit from 09/05/2014 in Primary Care at [place] from 08/25/2014 in Primary Care at [facility] Total Score 0 0       Assessment and Plan: Patient is still trying to deal with her husband's chronic illnesses.  She has now developed a new hobby of stitching quilts, is making them at home for herself.  She is taking 1 day at a time.  ZO:XWRUE pressure 125/72, pulse 87, height 4\' 11"  (1.499  m), weight 178 lb (80.7 kg), SpO2 100 %.    1. Depression with anxiety - sertraline (ZOLOFT) 100 MG tablet; Take 1 tablet (100 mg total) by mouth daily.  Dispense: 30 tablet; Refill: 2 - hydrOXYzine (ATARAX/VISTARIL) 25 MG tablet; Take 1 tablet (25 mg total) by mouth 3 (three) times daily as needed for anxiety.  Dispense: 90 tablet; Refill: 2  2. Unspecified intellectual disabilities   Continue same medication regimen. Follow up in 2 months.    [place] Crane, MD 04/27/2020, 3:26 PM

## 2020-05-06 MED FILL — MONTELUKAST SOD 10 MG TAB: 10 | 90 days supply | Qty: 90 | Fill #0

## 2020-05-15 MED FILL — HYDROXYZINE HCL 25 MG TABS: 25 | 30 days supply | Qty: 90 | Fill #2

## 2020-05-15 MED FILL — NEBIVOLOL HCL 5 MG TABS: 5 | 30 days supply | Qty: 30 | Fill #2

## 2020-05-26 ENCOUNTER — Other Ambulatory Visit (HOSPITAL_COMMUNITY): Payer: Self-pay | Admitting: Family Medicine

## 2020-05-26 MED FILL — OMEPRAZOLE 20 MG CAP: 20 | 90 days supply | Qty: 90 | Fill #0

## 2020-05-26 MED FILL — SERTRALINE HCL 100 MG TABS: 100 | 30 days supply | Qty: 30 | Fill #0

## 2020-06-16 MED FILL — HYDROXYZINE HCL 25 MG TABS: 25 | 30 days supply | Qty: 90 | Fill #0

## 2020-06-16 MED FILL — NEBIVOLOL HCL 5 MG TABS: 5 | 30 days supply | Qty: 30 | Fill #0

## 2020-06-23 ENCOUNTER — Other Ambulatory Visit (HOSPITAL_COMMUNITY): Payer: Self-pay | Admitting: Oral Surgery

## 2020-06-23 MED FILL — diazePAM 5 MG TABS: 5 | 2 days supply | Qty: 2 | Fill #0

## 2020-06-26 ENCOUNTER — Other Ambulatory Visit (HOSPITAL_COMMUNITY): Payer: Self-pay | Admitting: Family Medicine

## 2020-06-26 MED FILL — SERTRALINE HCL 100 MG TABS: 100 | 30 days supply | Qty: 30 | Fill #1

## 2020-06-26 MED FILL — MONTELUKAST SOD 10 MG TAB: 10 | 90 days supply | Qty: 90 | Fill #0

## 2020-06-29 ENCOUNTER — Ambulatory Visit (HOSPITAL_COMMUNITY): Payer: No Payment, Other | Admitting: Licensed Clinical Social Worker

## 2020-06-29 ENCOUNTER — Telehealth (HOSPITAL_COMMUNITY): Payer: Self-pay | Admitting: Professional

## 2020-06-29 ENCOUNTER — Ambulatory Visit (HOSPITAL_COMMUNITY): Payer: No Payment, Other | Admitting: Professional

## 2020-06-29 ENCOUNTER — Ambulatory Visit (HOSPITAL_COMMUNITY): Payer: No Payment, Other | Admitting: Psychiatry

## 2020-06-29 ENCOUNTER — Other Ambulatory Visit: Payer: Self-pay

## 2020-06-29 NOTE — Telephone Encounter (Signed)
See call log 

## 2020-07-02 ENCOUNTER — Other Ambulatory Visit (HOSPITAL_COMMUNITY): Payer: Self-pay | Admitting: Dentist

## 2020-07-02 MED FILL — CLINDAMYCIN HCL 300 MG CAP: 300 | 10 days supply | Qty: 40 | Fill #0

## 2020-07-02 MED FILL — oxyCODONE HCL 5 MG TABS: 5 | 2 days supply | Qty: 10 | Fill #0

## 2020-07-03 MED FILL — LEVOCETIRIZINE 5 MG TABLET: 5 | 90 days supply | Qty: 90 | Fill #1

## 2020-07-09 ENCOUNTER — Other Ambulatory Visit (HOSPITAL_COMMUNITY): Payer: Self-pay | Admitting: Oral Surgery

## 2020-07-09 MED FILL — diazePAM 5 MG TABS: 5 | 1 days supply | Qty: 2 | Fill #0

## 2020-07-13 ENCOUNTER — Ambulatory Visit (HOSPITAL_COMMUNITY): Payer: No Payment, Other | Admitting: Psychiatry

## 2020-07-14 ENCOUNTER — Ambulatory Visit (HOSPITAL_COMMUNITY): Payer: No Payment, Other | Admitting: Professional

## 2020-07-14 ENCOUNTER — Telehealth (HOSPITAL_COMMUNITY): Payer: Self-pay | Admitting: Professional

## 2020-07-14 ENCOUNTER — Other Ambulatory Visit: Payer: Self-pay

## 2020-07-14 NOTE — Telephone Encounter (Signed)
See call log 

## 2020-07-15 ENCOUNTER — Other Ambulatory Visit: Payer: Self-pay

## 2020-07-15 ENCOUNTER — Ambulatory Visit (INDEPENDENT_AMBULATORY_CARE_PROVIDER_SITE_OTHER): Payer: No Payment, Other | Admitting: Licensed Clinical Social Worker

## 2020-07-15 DIAGNOSIS — F418 Other specified anxiety disorders: Secondary | ICD-10-CM
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
## 2020-07-20 MED FILL — NEBIVOLOL HCL 5 MG TABS: 5 | 30 days supply | Qty: 30 | Fill #1

## 2020-07-20 MED FILL — HYDROXYZINE HCL 25 MG TABS: 25 | 30 days supply | Qty: 90 | Fill #1

## 2020-07-23 ENCOUNTER — Other Ambulatory Visit (HOSPITAL_COMMUNITY): Payer: Self-pay

## 2020-07-23 MED FILL — oxyCODONE HCL 5 MG TABS: 5 | 2 days supply | Qty: 10 | Fill #0

## 2020-08-03 MED FILL — SERTRALINE HCL 100 MG TABS: 100 | 30 days supply | Qty: 30 | Fill #2

## 2020-08-06 ENCOUNTER — Other Ambulatory Visit (HOSPITAL_COMMUNITY): Payer: Self-pay | Admitting: Family Medicine

## 2020-08-07 MED FILL — MELOXICAM 15 MG TABLET: 15 | 10 days supply | Qty: 10 | Fill #0

## 2020-08-11 ENCOUNTER — Ambulatory Visit (HOSPITAL_COMMUNITY): Payer: No Payment, Other | Admitting: Psychiatry

## 2020-08-17 ENCOUNTER — Other Ambulatory Visit (HOSPITAL_COMMUNITY): Payer: Self-pay

## 2020-08-17 MED FILL — Hydroxyzine HCl Tab 25 MG: ORAL | 30 days supply | Qty: 90 | Fill #0 | Status: AC

## 2020-08-18 ENCOUNTER — Other Ambulatory Visit (HOSPITAL_COMMUNITY): Payer: Self-pay

## 2020-08-19 ENCOUNTER — Other Ambulatory Visit: Payer: Self-pay

## 2020-08-19 ENCOUNTER — Ambulatory Visit (INDEPENDENT_AMBULATORY_CARE_PROVIDER_SITE_OTHER): Payer: No Payment, Other | Admitting: Licensed Clinical Social Worker

## 2020-08-19 ENCOUNTER — Other Ambulatory Visit (HOSPITAL_COMMUNITY): Payer: Self-pay

## 2020-08-19 DIAGNOSIS — F418 Other specified anxiety disorders: Secondary | ICD-10-CM | POA: Diagnosis not present

## 2020-08-20 ENCOUNTER — Other Ambulatory Visit (HOSPITAL_COMMUNITY): Payer: Self-pay

## 2020-08-20 MED ORDER — MONTELUKAST SODIUM 10 MG PO TABS
ORAL_TABLET | Freq: Every day | ORAL | 0 refills | Status: DC
  Filled 2020-08-20: qty 90, 90d supply, fill #0

## 2020-08-20 MED ORDER — ALBUTEROL SULFATE HFA 108 (90 BASE) MCG/ACT IN AERS
INHALATION_SPRAY | RESPIRATORY_TRACT | 0 refills | Status: DC
  Filled 2020-08-20: qty 8.5, 50d supply, fill #0

## 2020-08-20 MED ORDER — OMEPRAZOLE 20 MG PO CPDR
1.0000 | DELAYED_RELEASE_CAPSULE | Freq: Every day | ORAL | 0 refills | Status: DC
  Filled 2020-08-20: qty 90, 90d supply, fill #0

## 2020-08-20 MED ORDER — BUDESONIDE-FORMOTEROL FUMARATE 160-4.5 MCG/ACT IN AERO
INHALATION_SPRAY | RESPIRATORY_TRACT | 0 refills | Status: AC
  Filled 2020-08-20: qty 10.2, 30d supply, fill #0

## 2020-08-20 MED ORDER — ERGOCALCIFEROL 1.25 MG (50000 UT) PO CAPS
ORAL_CAPSULE | ORAL | 0 refills | Status: DC
  Filled 2020-08-20: qty 12, 84d supply, fill #0

## 2020-08-20 MED ORDER — CHLORHEXIDINE GLUCONATE 0.12 % MT SOLN
OROMUCOSAL | 11 refills | Status: DC
  Filled 2020-08-20: qty 473, 8d supply, fill #0

## 2020-08-22 ENCOUNTER — Other Ambulatory Visit (HOSPITAL_COMMUNITY): Payer: Self-pay
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
## 2020-08-27 ENCOUNTER — Other Ambulatory Visit (HOSPITAL_COMMUNITY): Payer: Self-pay

## 2020-08-27 MED FILL — Nebivolol HCl Tab 5 MG (Base Equivalent): ORAL | 30 days supply | Qty: 30 | Fill #0 | Status: AC

## 2020-08-28 ENCOUNTER — Other Ambulatory Visit (HOSPITAL_COMMUNITY): Payer: Self-pay

## 2020-08-29 ENCOUNTER — Other Ambulatory Visit (HOSPITAL_COMMUNITY): Payer: Self-pay

## 2020-09-03 ENCOUNTER — Other Ambulatory Visit (HOSPITAL_COMMUNITY): Payer: Self-pay

## 2020-09-08 ENCOUNTER — Ambulatory Visit (HOSPITAL_COMMUNITY): Payer: No Payment, Other | Admitting: Psychiatry

## 2020-09-09 ENCOUNTER — Telehealth (HOSPITAL_COMMUNITY): Payer: Self-pay | Admitting: Licensed Clinical Social Worker

## 2020-09-09 ENCOUNTER — Other Ambulatory Visit: Payer: Self-pay

## 2020-09-09 ENCOUNTER — Ambulatory Visit (HOSPITAL_COMMUNITY): Payer: No Payment, Other | Admitting: Licensed Clinical Social Worker

## 2020-09-09 NOTE — Telephone Encounter (Signed)
LCSW sent text link for video session per schedule. LCSW remained online for appt until 2:12 yet pt failed to sign on.

## 2020-09-21 ENCOUNTER — Other Ambulatory Visit (HOSPITAL_COMMUNITY): Payer: Self-pay

## 2020-09-21 ENCOUNTER — Other Ambulatory Visit (HOSPITAL_COMMUNITY): Payer: Self-pay | Admitting: Psychiatry

## 2020-09-21 DIAGNOSIS — F418 Other specified anxiety disorders: Secondary | ICD-10-CM

## 2020-09-21 MED ORDER — HYDROXYZINE HCL 25 MG PO TABS
ORAL_TABLET | Freq: Three times a day (TID) | ORAL | 2 refills | Status: DC | PRN
  Filled 2020-09-21: qty 90, 30d supply, fill #0

## 2020-09-21 MED ORDER — SERTRALINE HCL 100 MG PO TABS
ORAL_TABLET | Freq: Every day | ORAL | 2 refills | Status: DC
  Filled 2020-09-21: qty 30, 30d supply, fill #0

## 2020-09-28 ENCOUNTER — Other Ambulatory Visit (HOSPITAL_COMMUNITY): Payer: Self-pay

## 2020-09-29 ENCOUNTER — Telehealth (INDEPENDENT_AMBULATORY_CARE_PROVIDER_SITE_OTHER): Payer: No Payment, Other | Admitting: Psychiatry

## 2020-09-29 ENCOUNTER — Other Ambulatory Visit: Payer: Self-pay

## 2020-09-29 ENCOUNTER — Encounter (HOSPITAL_COMMUNITY): Payer: Self-pay | Admitting: Psychiatry

## 2020-09-29 ENCOUNTER — Other Ambulatory Visit (HOSPITAL_COMMUNITY): Payer: Self-pay

## 2020-09-29 DIAGNOSIS — F79 Unspecified intellectual disabilities: Secondary | ICD-10-CM

## 2020-09-29 DIAGNOSIS — F418 Other specified anxiety disorders: Secondary | ICD-10-CM

## 2020-09-29 MED ORDER — HYDROXYZINE HCL 25 MG PO TABS
ORAL_TABLET | Freq: Three times a day (TID) | ORAL | 2 refills | Status: DC | PRN
  Filled 2020-09-29: qty 90, fill #0
  Filled 2020-10-20: qty 90, 90d supply, fill #0
  Filled 2020-11-23: qty 90, 30d supply, fill #1

## 2020-09-29 MED ORDER — SERTRALINE HCL 100 MG PO TABS
ORAL_TABLET | Freq: Every day | ORAL | 2 refills | Status: DC
  Filled 2020-09-29: qty 30, fill #0
  Filled 2020-10-23: qty 30, 30d supply, fill #0
  Filled 2020-11-24: qty 30, 30d supply, fill #1
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
## 2020-10-01 ENCOUNTER — Other Ambulatory Visit (HOSPITAL_COMMUNITY): Payer: Self-pay

## 2020-10-01 MED ORDER — NEBIVOLOL HCL 5 MG PO TABS
ORAL_TABLET | Freq: Every day | ORAL | 0 refills | Status: DC
  Filled 2020-10-01 – 2021-01-01 (×2): qty 90, 90d supply, fill #0

## 2020-10-01 MED ORDER — NEBIVOLOL HCL 5 MG PO TABS
ORAL_TABLET | ORAL | 0 refills | Status: DC
  Filled 2020-10-01: qty 90, 90d supply, fill #0

## 2020-10-20 ENCOUNTER — Other Ambulatory Visit (HOSPITAL_COMMUNITY): Payer: Self-pay

## 2020-10-20 MED FILL — Levocetirizine Dihydrochloride Tab 5 MG: ORAL | 90 days supply | Qty: 90 | Fill #0 | Status: AC

## 2020-10-23 ENCOUNTER — Other Ambulatory Visit (HOSPITAL_COMMUNITY): Payer: Self-pay

## 2020-10-23 MED ORDER — ERGOCALCIFEROL 1.25 MG (50000 UT) PO CAPS
ORAL_CAPSULE | ORAL | 0 refills | Status: DC
  Filled 2020-10-23: qty 12, fill #0

## 2020-10-24 ENCOUNTER — Other Ambulatory Visit (HOSPITAL_COMMUNITY): Payer: Self-pay

## 2020-11-23 ENCOUNTER — Other Ambulatory Visit (HOSPITAL_COMMUNITY): Payer: Self-pay

## 2020-11-24 ENCOUNTER — Other Ambulatory Visit (HOSPITAL_COMMUNITY): Payer: Self-pay

## 2020-11-24 MED ORDER — OMEPRAZOLE 20 MG PO CPDR
20.0000 mg | DELAYED_RELEASE_CAPSULE | Freq: Every day | ORAL | 0 refills | Status: DC
  Filled 2020-11-24: qty 90, 90d supply, fill #0

## 2020-11-24 MED ORDER — NORGESTIMATE-ETH ESTRADIOL 0.25-35 MG-MCG PO TABS
ORAL_TABLET | ORAL | 0 refills | Status: AC
  Filled 2020-11-24: qty 84, 63d supply, fill #0

## 2020-11-26 ENCOUNTER — Other Ambulatory Visit (HOSPITAL_COMMUNITY): Payer: Self-pay

## 2020-12-11 ENCOUNTER — Other Ambulatory Visit (HOSPITAL_COMMUNITY): Payer: Self-pay

## 2020-12-11 ENCOUNTER — Encounter (HOSPITAL_COMMUNITY): Payer: Self-pay | Admitting: Physician Assistant

## 2020-12-11 ENCOUNTER — Telehealth (INDEPENDENT_AMBULATORY_CARE_PROVIDER_SITE_OTHER): Payer: No Payment, Other | Admitting: Physician Assistant

## 2020-12-11 DIAGNOSIS — F418 Other specified anxiety disorders: Secondary | ICD-10-CM

## 2020-12-11 MED ORDER — SERTRALINE HCL 100 MG PO TABS
ORAL_TABLET | Freq: Every day | ORAL | 2 refills | Status: DC
  Filled 2020-12-11: qty 30, fill #0
  Filled 2020-12-22: qty 30, 30d supply, fill #0
  Filled 2021-01-19: qty 30, 30d supply, fill #1

## 2020-12-11 MED ORDER — HYDROXYZINE HCL 25 MG PO TABS
ORAL_TABLET | Freq: Three times a day (TID) | ORAL | 2 refills | Status: DC | PRN
  Filled 2020-12-11: qty 90, fill #0
  Filled 2020-12-22: qty 90, 30d supply, fill #0
  Filled 2021-01-19: qty 90, 30d supply, fill #1
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
## 2020-12-22 ENCOUNTER — Other Ambulatory Visit (HOSPITAL_COMMUNITY): Payer: Self-pay

## 2020-12-23 ENCOUNTER — Other Ambulatory Visit (HOSPITAL_COMMUNITY): Payer: Self-pay

## 2021-01-01 ENCOUNTER — Other Ambulatory Visit (HOSPITAL_COMMUNITY): Payer: Self-pay

## 2021-01-19 ENCOUNTER — Other Ambulatory Visit (HOSPITAL_COMMUNITY): Payer: Self-pay

## 2021-01-19 MED ORDER — MONTELUKAST SODIUM 10 MG PO TABS
ORAL_TABLET | Freq: Every day | ORAL | 0 refills | Status: DC
  Filled 2021-01-19: qty 90, 90d supply, fill #0

## 2021-01-19 MED FILL — Levocetirizine Dihydrochloride Tab 5 MG: ORAL | 90 days supply | Qty: 90 | Fill #1 | Status: AC

## 2021-02-05 ENCOUNTER — Other Ambulatory Visit: Payer: Self-pay

## 2021-02-05 ENCOUNTER — Ambulatory Visit (INDEPENDENT_AMBULATORY_CARE_PROVIDER_SITE_OTHER): Payer: No Payment, Other | Admitting: Licensed Clinical Social Worker

## 2021-02-05 DIAGNOSIS — F418 Other specified anxiety disorders: Secondary | ICD-10-CM

## 2021-02-12 ENCOUNTER — Other Ambulatory Visit: Payer: Self-pay

## 2021-02-12 ENCOUNTER — Encounter (HOSPITAL_COMMUNITY): Payer: Self-pay | Admitting: Physician Assistant

## 2021-02-12 ENCOUNTER — Ambulatory Visit (INDEPENDENT_AMBULATORY_CARE_PROVIDER_SITE_OTHER): Payer: No Payment, Other | Admitting: Physician Assistant

## 2021-02-12 ENCOUNTER — Other Ambulatory Visit (HOSPITAL_COMMUNITY): Payer: Self-pay

## 2021-02-12 DIAGNOSIS — R41840 Attention and concentration deficit: Secondary | ICD-10-CM

## 2021-02-12 DIAGNOSIS — F79 Unspecified intellectual disabilities: Secondary | ICD-10-CM | POA: Diagnosis not present

## 2021-02-12 DIAGNOSIS — G4700 Insomnia, unspecified: Secondary | ICD-10-CM | POA: Diagnosis not present

## 2021-02-12 DIAGNOSIS — F418 Other specified anxiety disorders: Secondary | ICD-10-CM | POA: Diagnosis not present

## 2021-02-12 DIAGNOSIS — F419 Anxiety disorder, unspecified: Secondary | ICD-10-CM | POA: Insufficient documentation

## 2021-02-12 MED ORDER — ATOMOXETINE HCL 25 MG PO CAPS
25.0000 mg | ORAL_CAPSULE | Freq: Every day | ORAL | 1 refills | Status: DC
  Filled 2021-02-12: qty 30, 30d supply, fill #0
  Filled 2021-03-15: qty 30, 30d supply, fill #1

## 2021-02-12 MED ORDER — SERTRALINE HCL 100 MG PO TABS
ORAL_TABLET | Freq: Every day | ORAL | 2 refills | Status: DC
  Filled 2021-02-12: qty 30, 30d supply, fill #0
  Filled 2021-03-26: qty 30, 30d supply, fill #1
  Filled 2021-04-27: qty 30, 30d supply, fill #2

## 2021-02-12 MED ORDER — TRAZODONE HCL 50 MG PO TABS
50.0000 mg | ORAL_TABLET | Freq: Every day | ORAL | 1 refills | Status: DC
Start: 2021-02-12 — End: 2021-04-19
  Filled 2021-02-12: qty 30, 30d supply, fill #0
  Filled 2021-03-15: qty 30, 30d supply, fill #1

## 2021-02-12 MED ORDER — HYDROXYZINE HCL 25 MG PO TABS
ORAL_TABLET | Freq: Three times a day (TID) | ORAL | 2 refills | Status: DC | PRN
  Filled 2021-02-12: qty 90, 30d supply, fill #0
  Filled 2021-03-15: qty 90, 30d supply, fill #1
  Filled 2021-04-27: qty 90, 30d supply, fill #2

## 2021-02-12 MED ORDER — BUSPIRONE HCL 5 MG PO TABS
5.0000 mg | ORAL_TABLET | Freq: Two times a day (BID) | ORAL | 1 refills | Status: DC
  Filled 2021-02-12: qty 60, 30d supply, fill #0
  Filled 2021-03-15: qty 60, 30d supply, fill #1

## 2021-02-12 NOTE — Progress Notes (Addendum)
[facility] MD/PA/NP OP Progress Note  02/12/2021 9:25 PM Patty Stanley  MRN:  [id number]  Chief Complaint: Follow up and medication management  HPI:   Patty Stanley is a 40 year old female with a past psychiatric history significant for depression and anxiety who presents to [facility] for follow-up and medication management.  Patient is currently on the following medications:  Hydroxyzine 25 mg 3 times daily as needed Sertraline 100 mg daily  Patient reports that her husband believes that she is stressed in general.  Patient's husband also believes that she has ADHD but has never been diagnosed.  Patient denies experiencing any depressive episodes at this time.  She does endorse some anxiety she rates a 3 or 4 out of 10.  It appears that her anxiety may be related to she and her husband being a financial bind since her mother moved in with them.  She reports that sertraline has been helpful, however, hydroxyzine tends to make her tired by midday.  Interim regards to her memory, patient states that her memory has not been the best since contracted COVID last year.  She reports that whenever she reads, she often has to reread the same line.  Patient also states that she tends to shy away from activities that require intellectual bandwidth.  Patient denies issues with time management.  Patient denies any new stressors at this time.  She does report that she is worried about her husband health stating that he has been experiencing a deteriorating spine, heart issues, and kidney stones.  A GAD-7 screen was performed with the patient scoring an 18.  Patient is alert and oriented x4, calm, cooperative, and fully engaged in conversation during the encounter.  Patient endorses okay mood but states that she is a little nervous.  Patient denies suicidal or homicidal ideations.  She further denies auditory or visual hallucinations and does not appear to be responding  to internal/external stimuli.  Patient reports that her sleep has not been the greatest and often experiences interrupted sleep.  Patient endorses fair appetite and eats on average 2 meals per day.  Patient denies alcohol consumption, tobacco use, and illicit drug use.  Visit Diagnosis:    ICD-10-CM   1. Unspecified intellectual disabilities  F79     2. Depression with anxiety  F41.8 busPIRone (BUSPAR) 5 MG tablet    sertraline (ZOLOFT) 100 MG tablet    hydrOXYzine (ATARAX/VISTARIL) 25 MG tablet    3. Attention and concentration deficit  R41.840 atomoxetine (STRATTERA) 25 MG capsule    4. Insomnia, unspecified type  G47.00 traZODone (DESYREL) 50 MG tablet      Past Psychiatric History:  Depression Anxiety  Past Medical History:  Past Medical History:  Diagnosis Date   Alcohol abuse    ALLERGIC RHINITIS    Anxiety    ASTHMA    DEPRESSION    Headache(784.0)    OSTEOARTHRITIS    UNSPECIFIED TACHYCARDIA     Past Surgical History:  Procedure Laterality Date   WISDOM TOOTH EXTRACTION      Family Psychiatric History:  Patient is adopted  Family History:  Family History  Adopted: Yes  Problem Relation Age of Onset   Alcohol abuse Other    Arthritis Other     Social History:  Social History   Socioeconomic History   Marital status: Married    Spouse name: Not on file   Number of children: Not on file   Years of education: Not on file  Highest education level: Not on file  Occupational History   Occupation: [employer]    Employer: [employer]  Tobacco Use   Smoking status: Never   Smokeless tobacco: Never  Substance and Sexual Activity   Alcohol use: No   Drug use: No   Sexual activity: Yes    Birth control/protection: Pill  Other Topics Concern   Not on file  Social History Narrative   Not on file   Social Determinants of Health   Financial Resource Strain: Not on file  Food Insecurity: Not on file  Transportation Needs: Not on file  Physical  Activity: Not on file  Stress: Not on file  Social Connections: Not on file    Allergies:  Allergies  Allergen Reactions   Penicillins Hives    REACTION: Itching    Metabolic Disorder Labs: Lab Results  Component Value Date   HGBA1C 5.7 11/29/2012   No results found for: PROLACTIN Lab Results  Component Value Date   CHOL 167 11/29/2012   TRIG 113.0 11/29/2012   HDL 49.20 11/29/2012   CHOLHDL 3 11/29/2012   VLDL 22.6 11/29/2012   LDLCALC 95 11/29/2012   Lab Results  Component Value Date   TSH 1.12 11/29/2012   TSH 2.23 12/10/2010    Therapeutic Level Labs: No results found for: LITHIUM No results found for: VALPROATE No components found for:  CBMZ  Current Medications: Current Outpatient Medications  Medication Sig Dispense Refill   atomoxetine (STRATTERA) 25 MG capsule Take 1 capsule (25 mg total) by mouth daily. 30 capsule 1   busPIRone (BUSPAR) 5 MG tablet Take 1 tablet (5 mg total) by mouth 2 (two) times daily. 60 tablet 1   traZODone (DESYREL) 50 MG tablet Take 1 tablet (50 mg total) by mouth at bedtime. 30 tablet 1   albuterol (PROVENTIL HFA) 108 (90 BASE) MCG/ACT inhaler Inhale 2 puffs into the lungs every 6 (six) hours as needed for wheezing. 3 Inhaler 3   albuterol (VENTOLIN HFA) 108 (90 Base) MCG/ACT inhaler Inhale 1 puff into the lungs every 6 hours as needed. 8.5 g 0   beclomethasone (QVAR) 80 MCG/ACT inhaler Inhale 1 puff into the lungs 2 (two) times daily. (Patient taking differently: Inhale 1 puff into the lungs 2 (two) times daily as needed (asthma). ) 3 Inhaler 3   budesonide-formoterol (SYMBICORT) 160-4.5 MCG/ACT inhaler Inhale 1 puff into the lungs twice daily as needed. 10.2 g 0   cetirizine (ZYRTEC) 10 MG tablet Take 10 mg by mouth daily.     chlorhexidine (PERIDEX) 0.12 % solution Use 43ml to gargle and spit twice daily. 473 mL 11   clindamycin (CLEOCIN) 300 MG capsule TAKE 1 CAPSULE BY MOUTH EVERY 6 HOURS 40 capsule 0   cyclobenzaprine  (FLEXERIL) 10 MG tablet TAKE 1 TABLET BY MOUTH AT BEDTIME AS NEEDED FOR BACK PAIN 15 tablet 0   desloratadine (CLARINEX) 5 MG tablet Take 1 tablet (5 mg total) by mouth daily. 90 tablet 3   doxycycline (VIBRA-TABS) 100 MG tablet TAKE 1 TABLET BY MOUTH EVERY 12 HOURS FOR 5 DAYS 10 tablet 0   ergocalciferol (VITAMIN D2) 1.25 MG (50000 UT) capsule Take 1 capsule (1.25 mg) by mouth weekly 12 capsule 0   hydrOXYzine (ATARAX/VISTARIL) 25 MG tablet TAKE 1 TABLET BY MOUTH 3 TIMES DAILY AS NEEDED FOR ANXIETY. 90 tablet 2   levocetirizine (XYZAL) 5 MG tablet TAKE ONE TABLET BY MOUTH DAILY IN THE EVENING 90 tablet 3   meloxicam (MOBIC) 15  MG tablet TAKE 1 TABLET BY MOUTH DAILY 10 tablet 0   meloxicam (MOBIC) 7.5 MG tablet TAKE 1 TABLET BY MOUTH TWO TIMES DAILY AS NEEDED 30 tablet 0   mometasone (NASONEX) 50 MCG/ACT nasal spray Place 24 sprays into the nose daily. (Patient not taking: Reported on 08/20/2014) 51 g 3   montelukast (SINGULAIR) 10 MG tablet Take 1 tablet (10 mg total) by mouth daily. 90 tablet 3   montelukast (SINGULAIR) 10 MG tablet TAKE 1 TABLET BY MOUTH ONCE DAILY 90 tablet 0   montelukast (SINGULAIR) 10 MG tablet Take 1 tablet by mouth daily 90 tablet 0   nebivolol (BYSTOLIC) 5 MG tablet TAKE 1 TABLET BY MOUTH ONCE A DAY 90 tablet 0   nebivolol (BYSTOLIC) 5 MG tablet Take 1 tablet (5 mg) by mouth daily 90 tablet 0   nebivolol (BYSTOLIC) 5 MG tablet TAKE 1 TABLET BY MOUTH DAILY 90 tablet 0   norgestimate-ethinyl estradiol (ORTHO-CYCLEN) 0.25-35 MG-MCG tablet TAKE 1 TABLET BY MOUTH ONCE DAILY CONTINUOUSLY AS DIRECTED 84 tablet 0   norgestimate-ethinyl estradiol (VYLIBRA) 0.25-35 MG-MCG tablet Take 1 tablet by mouth once daily as directed-ok continuous dosing 84 tablet 0   omeprazole (PRILOSEC) 20 MG capsule TAKE 1 CAPSULE BY MOUTH ONCE DAILY 90 capsule 0   omeprazole (PRILOSEC) 20 MG capsule TAKE 1 CAPSULE BY MOUTH ONCE DAILY 90 capsule 0   omeprazole (PRILOSEC) 40 MG capsule Take 40 mg by mouth  daily.     sertraline (ZOLOFT) 100 MG tablet TAKE 1 TABLET BY MOUTH DAILY. 30 tablet 2   No current facility-administered medications for this visit.     Musculoskeletal: Strength & Muscle Tone: within normal limits Gait & Station: normal Patient leans: N/A  Psychiatric Specialty Exam: Review of Systems  Psychiatric/Behavioral:  Positive for decreased concentration and sleep disturbance. Negative for dysphoric mood, hallucinations, self-injury and suicidal ideas. The patient is nervous/anxious. The patient is not hyperactive.    There were no vitals taken for this visit.There is no height or weight on file to calculate BMI.  General Appearance: Well Groomed  Eye Contact:  Good  Speech:  Clear and Coherent and Normal Rate  Volume:  Normal  Mood:  Anxious and Euthymic  Affect:  Appropriate and Congruent  Thought Process:  Coherent, Goal Directed, and Descriptions of Associations: Intact  Orientation:  Full (Time, Place, and Person)  Thought Content: WDL   Suicidal Thoughts:  No  Homicidal Thoughts:  No  Memory:  Immediate;   Good Recent;   Good Remote;   Good  Judgement:  Fair  Insight:  Fair  Psychomotor Activity:  Normal  Concentration:  Concentration: Good and Attention Span: Good  Recall:  Good  Fund of Knowledge: Good  Language: Good  Akathisia:  Negative  Handed:  Right  AIMS (if indicated): not done  Assets:  Communication Skills Desire for Improvement Financial Resources/Insurance Housing Social Support  ADL's:  Intact  Cognition: Impaired,  Mild  Sleep:  Fair   Screenings: GAD-7    Flowsheet Row Clinical Support from 02/12/2021 in [facility] Video Visit from 12/11/2020 in [facility]  Total GAD-7 Score 18 13      PHQ2-9    [place] from 02/12/2021 in [facility] Video Visit from 12/11/2020 in [facility] Counselor from  07/15/2020 in [facility] Office Visit from 09/05/2014 in Primary Care at [place] from 08/25/2014  in Primary Care at [facility] Total Score 0 1 4 0 0  PHQ-9 Total Score -- -- 15 -- --      Flowsheet Row Clinical Support from 02/12/2021 in [facility] Video Visit from 12/11/2020 in [facility] Counselor from 07/15/2020 in [place] CATEGORY Low Risk Low Risk No Risk        Assessment and Plan:   Patty Stanley is a 40 year old female with a past psychiatric history significant for depression and anxiety who presents to [facility] for follow-up and medication management.  Patient presents to the clinic with concerns regarding possible ADHD symptoms.  Patient has not been diagnosed with ADHD but states that she has been having issues with memory, she away from activities that require intellectual effort, and rereading lines.  Patient was recommended Strattera 25 mg daily for the management of her lack of concentration/focus.  Patient also endorses anxiety and states that her anxiety is related to a number of stressors in her life.  Patient was recommended buspirone 5 mg 2 times daily for the management of her anxiety.  Patient was agreeable to recommendations.  Patient's medications to be e-prescribed to pharmacy of choice.  1. Depression with anxiety  - busPIRone (BUSPAR) 5 MG tablet; Take 1 tablet (5 mg total) by mouth 2 (two) times daily.  Dispense: 60 tablet; Refill: 1 - sertraline (ZOLOFT) 100 MG tablet; TAKE 1 TABLET BY MOUTH DAILY.  Dispense: 30 tablet; Refill: 2 - hydrOXYzine (ATARAX/VISTARIL) 25 MG tablet; TAKE 1 TABLET BY MOUTH 3 TIMES DAILY AS NEEDED FOR ANXIETY.  Dispense: 90 tablet; Refill: 2  2. Unspecified intellectual disabilities   3. Attention and concentration deficit  - atomoxetine  (STRATTERA) 25 MG capsule; Take 1 capsule (25 mg total) by mouth daily.  Dispense: 30 capsule; Refill: 1  4. Insomnia, unspecified type  - traZODone (DESYREL) 50 MG tablet; Take 1 tablet (50 mg total) by mouth at bedtime.  Dispense: 30 tablet; Refill: 1   Patient to follow up in 2 months Provider spent a total of 21 minutes with the patient/reviewing the patient's chart  [name], PA 02/12/2021, 9:25 PM

## 2021-02-13 ENCOUNTER — Other Ambulatory Visit (HOSPITAL_COMMUNITY): Payer: Self-pay

## 2021-02-15 ENCOUNTER — Other Ambulatory Visit (HOSPITAL_COMMUNITY): Payer: Self-pay
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
## 2021-02-24 ENCOUNTER — Other Ambulatory Visit (HOSPITAL_COMMUNITY): Payer: Self-pay

## 2021-02-24 MED ORDER — OMEPRAZOLE 20 MG PO CPDR
20.0000 mg | DELAYED_RELEASE_CAPSULE | Freq: Every day | ORAL | 0 refills | Status: DC
  Filled 2021-02-24: qty 90, 90d supply, fill #0

## 2021-03-01 ENCOUNTER — Telehealth (HOSPITAL_COMMUNITY): Payer: Self-pay | Admitting: *Deleted

## 2021-03-01 NOTE — Telephone Encounter (Signed)
Pt called--stated traZODone (DESYREL) 50 MG tablet --TAking 1 tab is not working, only get 4 hr. Sleep. Also, questioning if anxiety or other meds can cause hands shake?

## 2021-03-02 ENCOUNTER — Telehealth (HOSPITAL_COMMUNITY): Payer: Self-pay | Admitting: *Deleted

## 2021-03-02 NOTE — Telephone Encounter (Signed)
VM from patient stating her sleeping medicine wasn't really working. She is on Trazodone 50 mg. Tried to call her back to get additional information before consulting Eddie PA but no answer. I left a message but will forward her concern to [facility].

## 2021-03-02 NOTE — Telephone Encounter (Signed)
Provider contacted by Betsey Holiday. Polk, CMA regarding patient's medication concerns. Will contact patient to discuss concerns as well as manage medications.

## 2021-03-15 ENCOUNTER — Other Ambulatory Visit (HOSPITAL_COMMUNITY): Payer: Self-pay

## 2021-03-16 ENCOUNTER — Other Ambulatory Visit (HOSPITAL_COMMUNITY): Payer: Self-pay

## 2021-03-17 ENCOUNTER — Other Ambulatory Visit (HOSPITAL_COMMUNITY): Payer: Self-pay

## 2021-03-22 NOTE — Telephone Encounter (Signed)
Provider was contacted by Davina Poke, RN regarding patient's concerns over ineffectiveness of her trazodone. Provider to contact patient to discuss titrating up on the dose of the medication.

## 2021-03-26 ENCOUNTER — Other Ambulatory Visit (HOSPITAL_COMMUNITY): Payer: Self-pay

## 2021-03-26 MED ORDER — NEBIVOLOL HCL 5 MG PO TABS
ORAL_TABLET | ORAL | 0 refills | Status: DC
  Filled 2021-03-26: qty 90, 90d supply, fill #0

## 2021-03-31 ENCOUNTER — Other Ambulatory Visit (HOSPITAL_COMMUNITY): Payer: Self-pay

## 2021-04-02 ENCOUNTER — Other Ambulatory Visit (HOSPITAL_COMMUNITY): Payer: Self-pay

## 2021-04-02 MED ORDER — AZITHROMYCIN 250 MG PO TABS
ORAL_TABLET | ORAL | 0 refills | Status: DC
  Filled 2021-04-02: qty 6, 5d supply, fill #0

## 2021-04-02 MED ORDER — ALBUTEROL SULFATE HFA 108 (90 BASE) MCG/ACT IN AERS
INHALATION_SPRAY | RESPIRATORY_TRACT | 0 refills | Status: AC
  Filled 2021-04-02: qty 8.5, 66d supply, fill #0

## 2021-04-02 MED ORDER — PREDNISONE 10 MG PO TABS
ORAL_TABLET | ORAL | 0 refills | Status: DC
  Filled 2021-04-02: qty 10, 10d supply, fill #0

## 2021-04-13 ENCOUNTER — Other Ambulatory Visit (HOSPITAL_COMMUNITY): Payer: Self-pay

## 2021-04-13 ENCOUNTER — Other Ambulatory Visit (HOSPITAL_COMMUNITY): Payer: Self-pay | Admitting: Physician Assistant

## 2021-04-13 DIAGNOSIS — F418 Other specified anxiety disorders: Secondary | ICD-10-CM

## 2021-04-13 DIAGNOSIS — R41840 Attention and concentration deficit: Secondary | ICD-10-CM

## 2021-04-13 MED ORDER — MONTELUKAST SODIUM 10 MG PO TABS
ORAL_TABLET | Freq: Every day | ORAL | 0 refills | Status: DC
  Filled 2021-04-13: qty 90, 90d supply, fill #0

## 2021-04-13 MED ORDER — BUSPIRONE HCL 5 MG PO TABS
5.0000 mg | ORAL_TABLET | Freq: Two times a day (BID) | ORAL | 1 refills | Status: DC
  Filled 2021-04-13: qty 60, 30d supply, fill #0

## 2021-04-13 MED ORDER — ATOMOXETINE HCL 25 MG PO CAPS
25.0000 mg | ORAL_CAPSULE | Freq: Every day | ORAL | 1 refills | Status: DC
  Filled 2021-04-13: qty 30, 30d supply, fill #0

## 2021-04-15 ENCOUNTER — Other Ambulatory Visit (HOSPITAL_COMMUNITY): Payer: Self-pay

## 2021-04-15 MED ORDER — LEVOCETIRIZINE DIHYDROCHLORIDE 5 MG PO TABS
ORAL_TABLET | Freq: Every evening | ORAL | 3 refills | Status: DC
  Filled 2021-04-15: qty 90, 90d supply, fill #0
  Filled 2021-07-16: qty 90, 90d supply, fill #1
  Filled 2021-11-17: qty 90, 90d supply, fill #2
  Filled 2022-03-07: qty 90, 90d supply, fill #3

## 2021-04-16 ENCOUNTER — Telehealth (HOSPITAL_COMMUNITY): Payer: No Payment, Other | Admitting: Physician Assistant

## 2021-04-16 ENCOUNTER — Ambulatory Visit (INDEPENDENT_AMBULATORY_CARE_PROVIDER_SITE_OTHER): Payer: No Payment, Other | Admitting: Licensed Clinical Social Worker

## 2021-04-16 DIAGNOSIS — F411 Generalized anxiety disorder: Secondary | ICD-10-CM | POA: Diagnosis not present

## 2021-04-19 ENCOUNTER — Encounter (HOSPITAL_COMMUNITY): Payer: Self-pay | Admitting: Physician Assistant

## 2021-04-19 ENCOUNTER — Telehealth (INDEPENDENT_AMBULATORY_CARE_PROVIDER_SITE_OTHER): Payer: No Payment, Other | Admitting: Physician Assistant

## 2021-04-19 ENCOUNTER — Other Ambulatory Visit (HOSPITAL_COMMUNITY): Payer: Self-pay

## 2021-04-19 DIAGNOSIS — R41840 Attention and concentration deficit: Secondary | ICD-10-CM

## 2021-04-19 DIAGNOSIS — G4700 Insomnia, unspecified: Secondary | ICD-10-CM

## 2021-04-19 DIAGNOSIS — F418 Other specified anxiety disorders: Secondary | ICD-10-CM

## 2021-04-19 MED ORDER — QUETIAPINE FUMARATE 50 MG PO TABS
50.0000 mg | ORAL_TABLET | Freq: Every day | ORAL | 1 refills | Status: DC
  Filled 2021-04-19: qty 30, 30d supply, fill #0
  Filled 2021-05-17: qty 30, 30d supply, fill #1

## 2021-04-19 MED ORDER — BUSPIRONE HCL 7.5 MG PO TABS
7.5000 mg | ORAL_TABLET | Freq: Two times a day (BID) | ORAL | 1 refills | Status: DC
  Filled 2021-04-19: qty 60, 30d supply, fill #0
  Filled 2021-06-16: qty 60, 30d supply, fill #1

## 2021-04-19 MED ORDER — ATOMOXETINE HCL 25 MG PO CAPS
25.0000 mg | ORAL_CAPSULE | Freq: Every day | ORAL | 1 refills | Status: DC
  Filled 2021-04-19 – 2021-05-17 (×2): qty 30, 30d supply, fill #0
  Filled 2021-06-16: qty 30, 30d supply, fill #1

## 2021-04-19 NOTE — Progress Notes (Signed)
[place] MD/PA/NP OP Progress Note  Virtual Visit via Telephone Note  I connected with Patty Stanley on 04/19/21 at  3:00 PM EST by telephone and verified that I am speaking with the correct person using two identifiers.  Location: Patient: Home Provider: Clinic   I discussed the limitations, risks, security and privacy concerns of performing an evaluation and management service by telephone and the availability of in person appointments. I also discussed with the patient that there may be a patient responsible charge related to this service. The patient expressed understanding and agreed to proceed.  Follow Up Instructions:   I discussed the assessment and treatment plan with the patient. The patient was provided an opportunity to ask questions and all were answered. The patient agreed with the plan and demonstrated an understanding of the instructions.   The patient was advised to call back or seek an in-person evaluation if the symptoms worsen or if the condition fails to improve as anticipated.  I provided 25 minutes of non-face-to-face time during this encounter.  [name], PA    04/19/2021 3:53 PM Patty Stanley  MRN:  [id number]  Chief Complaint: Follow up and medication management  HPI:   Patty Stanley is a 40 year old female with a past psychiatric history significant for depression, anxiety, and attention/concentration deficit who presents to [facility] behavioral health outpatient clinic via virtual telephone visit for follow-up and medication management.  Patient is currently being managed on the following medications:  Buspirone 5 mg 2 times daily Zoloft 100 mg daily Hydroxyzine 25 mg 3 times daily as needed Trazodone 50 mg at bedtime Atomoxetine 25 mg daily  Patient presents today with issues with her sleep.  Patient states that she is still continuing to receive bad sleep.  She also reports that she has been hallucinating, having night terrors,  and fighting in her sleep.  Patient denies having been placed on new medication recently.  Patient states that since taking trazodone, old habits have started to reappear such as talking in her sleep.  Patient also expresses having extreme highs and lows in mood and picking at her thumbs due to anxiety.  Per husband, patient has been a handful since she has been on trazodone.  He states that the patient has been experiencing hallucinations characterized by seeing spiders or other bugs.  He states that the patient also recalls seeing someone pick around the corner in her line of vision.  He reports that the patient experiences short bouts of depression but states that when they occur, her mood drops very quickly.  He states that the patient will also experience zoning out during the middle of conversation.  In addition to depressive episodes, patient states that she is still experiencing anxiety.  Patient rates her anxiety a 7 or 8 out of 10.  Patient stressors include her husband having undergone back surgery as well as his mother moving in with them for roughly 6 months.  A GAD-7 screen was performed with the patient scoring an 18.  Patient is alert and oriented x4, calm, cooperative, and fully engaged in conversation during the encounter.  Patient endorses feeling annoyed.  Patient denies suicidal or homicidal ideations.  She denies active auditory or visual hallucinations at this time but states that roughly an hour ago she was swatting at bugs that were there.  Patient endorses poor sleep stating that she gets roughly 2 hours of sleep at a time.  Patient endorses decreased appetite and eats roughly 1 meal per  day.  Patient denies alcohol consumption, tobacco use, and illicit drug use.  Visit Diagnosis:    ICD-10-CM   1. Attention and concentration deficit  R41.840 atomoxetine (STRATTERA) 25 MG capsule    2. Depression with anxiety  F41.8 busPIRone (BUSPAR) 7.5 MG tablet    QUEtiapine (SEROQUEL) 50 MG  tablet      Past Psychiatric History:  Depression Anxiety  Past Medical History:  Past Medical History:  Diagnosis Date   Alcohol abuse    ALLERGIC RHINITIS    Anxiety    ASTHMA    DEPRESSION    Headache(784.0)    OSTEOARTHRITIS    UNSPECIFIED TACHYCARDIA     Past Surgical History:  Procedure Laterality Date   WISDOM TOOTH EXTRACTION      Family Psychiatric History:  Patient is adopted  Family History:  Family History  Adopted: Yes  Problem Relation Age of Onset   Alcohol abuse Other    Arthritis Other     Social History:  Social History   Socioeconomic History   Marital status: Married    Spouse name: Not on file   Number of children: Not on file   Years of education: Not on file   Highest education level: Not on file  Occupational History   Occupation: [employer]    Employer: [employer]  Tobacco Use   Smoking status: Never   Smokeless tobacco: Never  Substance and Sexual Activity   Alcohol use: No   Drug use: No   Sexual activity: Yes    Birth control/protection: Pill  Other Topics Concern   Not on file  Social History Narrative   Not on file   Social Determinants of Health   Financial Resource Strain: Not on file  Food Insecurity: Not on file  Transportation Needs: Not on file  Physical Activity: Not on file  Stress: Not on file  Social Connections: Not on file    Allergies:  Allergies  Allergen Reactions   Penicillins Hives    REACTION: Itching    Metabolic Disorder Labs: Lab Results  Component Value Date   HGBA1C 5.7 11/29/2012   No results found for: PROLACTIN Lab Results  Component Value Date   CHOL 167 11/29/2012   TRIG 113.0 11/29/2012   HDL 49.20 11/29/2012   CHOLHDL 3 11/29/2012   VLDL 22.6 11/29/2012   LDLCALC 95 11/29/2012   Lab Results  Component Value Date   TSH 1.12 11/29/2012   TSH 2.23 12/10/2010    Therapeutic Level Labs: No results found for: LITHIUM No results found for: VALPROATE No  components found for:  CBMZ  Current Medications: Current Outpatient Medications  Medication Sig Dispense Refill   QUEtiapine (SEROQUEL) 50 MG tablet Take 1 tablet (50 mg total) by mouth at bedtime. 30 tablet 1   albuterol (PROVENTIL HFA) 108 (90 BASE) MCG/ACT inhaler Inhale 2 puffs into the lungs every 6 (six) hours as needed for wheezing. 3 Inhaler 3   albuterol (VENTOLIN HFA) 108 (90 Base) MCG/ACT inhaler Inhale 1 puff into the lungs every 6 hours as needed. 8.5 g 0   albuterol (VENTOLIN HFA) 108 (90 Base) MCG/ACT inhaler Inhale 1 puff by mouth into the lungs every 8 hours as needed 8.5 g 0   atomoxetine (STRATTERA) 25 MG capsule Take 1 capsule (25 mg total) by mouth daily. 30 capsule 1   azithromycin (ZITHROMAX) 250 MG tablet Take 2 tablets by mouth today then take 1 tablet by mouth daily for 4 days 18 tablet  0   beclomethasone (QVAR) 80 MCG/ACT inhaler Inhale 1 puff into the lungs 2 (two) times daily. (Patient taking differently: Inhale 1 puff into the lungs 2 (two) times daily as needed (asthma). ) 3 Inhaler 3   budesonide-formoterol (SYMBICORT) 160-4.5 MCG/ACT inhaler Inhale 1 puff into the lungs twice daily as needed. 10.2 g 0   busPIRone (BUSPAR) 7.5 MG tablet Take 1 tablet (7.5 mg total) by mouth 2 (two) times daily. 60 tablet 1   cetirizine (ZYRTEC) 10 MG tablet Take 10 mg by mouth daily.     chlorhexidine (PERIDEX) 0.12 % solution Use 79ml to gargle and spit twice daily. 473 mL 11   clindamycin (CLEOCIN) 300 MG capsule TAKE 1 CAPSULE BY MOUTH EVERY 6 HOURS 40 capsule 0   desloratadine (CLARINEX) 5 MG tablet Take 1 tablet (5 mg total) by mouth daily. 90 tablet 3   ergocalciferol (VITAMIN D2) 1.25 MG (50000 UT) capsule Take 1 capsule (1.25 mg) by mouth weekly 12 capsule 0   hydrOXYzine (ATARAX/VISTARIL) 25 MG tablet TAKE 1 TABLET BY MOUTH 3 TIMES DAILY AS NEEDED FOR ANXIETY. 90 tablet 2   levocetirizine (XYZAL) 5 MG tablet TAKE ONE TABLET BY MOUTH DAILY IN THE EVENING 90 tablet 3    meloxicam (MOBIC) 15 MG tablet TAKE 1 TABLET BY MOUTH DAILY 10 tablet 0   mometasone (NASONEX) 50 MCG/ACT nasal spray Place 24 sprays into the nose daily. (Patient not taking: Reported on 08/20/2014) 51 g 3   montelukast (SINGULAIR) 10 MG tablet Take 1 tablet (10 mg total) by mouth daily. 90 tablet 3   montelukast (SINGULAIR) 10 MG tablet TAKE 1 TABLET BY MOUTH ONCE DAILY 90 tablet 0   montelukast (SINGULAIR) 10 MG tablet Take 1 tablet by mouth daily 90 tablet 0   nebivolol (BYSTOLIC) 5 MG tablet TAKE 1 TABLET BY MOUTH ONCE A DAY 90 tablet 0   nebivolol (BYSTOLIC) 5 MG tablet TAKE 1 TABLET BY MOUTH DAILY 90 tablet 0   nebivolol (BYSTOLIC) 5 MG tablet Take 1 tablet (5 mg) by mouth daily 90 tablet 0   norgestimate-ethinyl estradiol (ORTHO-CYCLEN) 0.25-35 MG-MCG tablet TAKE 1 TABLET BY MOUTH ONCE DAILY CONTINUOUSLY AS DIRECTED 84 tablet 0   norgestimate-ethinyl estradiol (VYLIBRA) 0.25-35 MG-MCG tablet Take 1 tablet by mouth once daily as directed-ok continuous dosing 84 tablet 0   omeprazole (PRILOSEC) 20 MG capsule TAKE 1 CAPSULE BY MOUTH ONCE DAILY 90 capsule 0   omeprazole (PRILOSEC) 20 MG capsule TAKE 1 CAPSULE BY MOUTH ONCE DAILY 90 capsule 0   omeprazole (PRILOSEC) 40 MG capsule Take 40 mg by mouth daily.     predniSONE (DELTASONE) 10 MG tablet Take 1 tablet (10 mg) by mouth daily 10 tablet 0   sertraline (ZOLOFT) 100 MG tablet TAKE 1 TABLET BY MOUTH DAILY. 30 tablet 2   No current facility-administered medications for this visit.     Musculoskeletal: Strength & Muscle Tone: Unable to assess due to telemedicine visit [place]: Unable to assess due to telemedicine visit Patient leans: Unable to assess due to telemedicine visit  Psychiatric Specialty Exam: Review of Systems  Psychiatric/Behavioral:  Positive for hallucinations and sleep disturbance. Negative for decreased concentration, dysphoric mood, self-injury and suicidal ideas. The patient is nervous/anxious. The patient is not  hyperactive.    There were no vitals taken for this visit.There is no height or weight on file to calculate BMI.  General Appearance: Unable to assess due to telemedicine visit  Eye Contact: Unable to assess  due to telemedicine visit   Speech:  Clear and Coherent and Normal Rate  Volume:  Normal  Mood:  Anxious and Depressed  Affect:  Congruent  Thought Process:  Coherent, Goal Directed, and Descriptions of Associations: Intact  Orientation:  Full (Time, Place, and Person)  Thought Content: WDL and Hallucinations: Tactile Visual   Suicidal Thoughts:  No  Homicidal Thoughts:  No  Memory:  Immediate;   Good Recent;   Good Remote;   Good  Judgement:  Fair  Insight:  Fair  Psychomotor Activity:  Normal  Concentration:  Concentration: Good and Attention Span: Good  Recall:  Good  Fund of Knowledge: Good  Language: Good  Akathisia:  Negative  Handed:  Right  AIMS (if indicated): not done  Assets:  Communication Skills Desire for Improvement Financial Resources/Insurance Housing Social Support  ADL's:  Intact  Cognition: Impaired,  Mild  Sleep:  Poor   Screenings: GAD-7    Flowsheet Row Video Visit from 04/19/2021 in [place] from 02/12/2021 in [facility] Video Visit from 12/11/2020 in [facility]  Total GAD-7 Score 18 18 13       PHQ2-9    Flowsheet Row Video Visit from 04/19/2021 in [place] from 02/12/2021 in [facility] Video Visit from 12/11/2020 in [facility] Counselor from 07/15/2020 in [facility] Office Visit from 09/05/2014 in [facility] at [facility] Total Score 1 0 1 4 0  PHQ-9 Total Score -- -- -- 15 --      Flowsheet Row Video Visit from 04/19/2021 in [place] from 02/12/2021  in [facility] Video Visit from 12/11/2020 in [place] and Plan:   Patty Stanley is a 40 year old female with a past psychiatric history significant for depression, anxiety, and attention/concentration deficit who presents to [facility] behavioral health outpatient clinic via virtual telephone visit for follow-up and medication management.  Patient presents to the clinic expressing the following issues: Fluctuating mood, worsening anxiety, hallucinations, and night terrors.  Patient reports that she started experiencing the symptoms at the start of taking trazodone and has since discontinued taking trazodone.  Patient states that she has only been able to get roughly 2 hours of sleep at a time.  For the management of her anxiety, patient was recommended adjusting her buspirone from 5 mg to 7.5 mg 3 times daily.  Patient was also recommended Seroquel 50 mg at bedtime for the management of her sleep/recent hallucinations.  Patient was agreeable to recommendations.  Patient's medications to be e-prescribed to pharmacy of choice.  1. Attention and concentration deficit  - atomoxetine (STRATTERA) 25 MG capsule; Take 1 capsule (25 mg total) by mouth daily.  Dispense: 30 capsule; Refill: 1  2. Depression with anxiety  - busPIRone (BUSPAR) 7.5 MG tablet; Take 1 tablet (7.5 mg total) by mouth 2 (two) times daily.  Dispense: 60 tablet; Refill: 1 - QUEtiapine (SEROQUEL) 50 MG tablet; Take 1 tablet (50 mg total) by mouth at bedtime.  Dispense: 30 tablet; Refill: 1  3. Insomnia, unspecified type Patient to take Seroquel 50 mg at bedtime for the management of her mood, hallucinations, and insomnia.  Patient to follow up in 2 months Provider spent  a total of 25 minutes with the patient/reviewing the patient's chart  [name], PA 04/19/2021, 3:53 PM

## 2021-04-20 ENCOUNTER — Other Ambulatory Visit (HOSPITAL_COMMUNITY): Payer: Self-pay
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
## 2021-04-27 ENCOUNTER — Other Ambulatory Visit (HOSPITAL_COMMUNITY): Payer: Self-pay

## 2021-05-17 ENCOUNTER — Other Ambulatory Visit (HOSPITAL_COMMUNITY): Payer: Self-pay

## 2021-05-18 ENCOUNTER — Other Ambulatory Visit (HOSPITAL_COMMUNITY): Payer: Self-pay

## 2021-05-19 ENCOUNTER — Ambulatory Visit (HOSPITAL_COMMUNITY): Payer: No Payment, Other | Admitting: Licensed Clinical Social Worker

## 2021-05-19 DIAGNOSIS — F411 Generalized anxiety disorder: Secondary | ICD-10-CM
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
## 2021-05-21 ENCOUNTER — Other Ambulatory Visit (HOSPITAL_COMMUNITY): Payer: Self-pay | Admitting: Physician Assistant

## 2021-05-21 ENCOUNTER — Other Ambulatory Visit (HOSPITAL_COMMUNITY): Payer: Self-pay

## 2021-05-21 DIAGNOSIS — F418 Other specified anxiety disorders: Secondary | ICD-10-CM

## 2021-05-22 ENCOUNTER — Other Ambulatory Visit (HOSPITAL_COMMUNITY): Payer: Self-pay

## 2021-05-26 ENCOUNTER — Other Ambulatory Visit (HOSPITAL_COMMUNITY): Payer: Self-pay

## 2021-05-26 ENCOUNTER — Other Ambulatory Visit (HOSPITAL_COMMUNITY): Payer: Self-pay | Admitting: Physician Assistant

## 2021-05-26 DIAGNOSIS — F418 Other specified anxiety disorders: Secondary | ICD-10-CM

## 2021-05-26 MED ORDER — SERTRALINE HCL 100 MG PO TABS
ORAL_TABLET | Freq: Every day | ORAL | 2 refills | Status: DC
  Filled 2021-05-26: qty 30, 30d supply, fill #0

## 2021-05-26 MED ORDER — HYDROXYZINE HCL 25 MG PO TABS
ORAL_TABLET | Freq: Three times a day (TID) | ORAL | 2 refills | Status: DC | PRN
  Filled 2021-05-26: qty 90, 30d supply, fill #0
  Filled 2021-06-28: qty 90, 30d supply, fill #1
  Filled 2021-07-29: qty 90, 30d supply, fill #2

## 2021-05-26 MED ORDER — OMEPRAZOLE 20 MG PO CPDR
20.0000 mg | DELAYED_RELEASE_CAPSULE | Freq: Every day | ORAL | 0 refills | Status: DC
  Filled 2021-05-26: qty 90, 90d supply, fill #0

## 2021-05-31 ENCOUNTER — Other Ambulatory Visit (HOSPITAL_COMMUNITY): Payer: Self-pay

## 2021-05-31 MED ORDER — DOXYCYCLINE HYCLATE 100 MG PO CAPS
ORAL_CAPSULE | ORAL | 0 refills | Status: DC
  Filled 2021-05-31: qty 20, 10d supply, fill #0

## 2021-05-31 MED ORDER — PREDNISONE 10 MG PO TABS
ORAL_TABLET | ORAL | 0 refills | Status: DC
  Filled 2021-05-31: qty 10, 10d supply, fill #0

## 2021-06-16 ENCOUNTER — Other Ambulatory Visit (HOSPITAL_COMMUNITY): Payer: Self-pay

## 2021-06-17 ENCOUNTER — Other Ambulatory Visit (HOSPITAL_COMMUNITY): Payer: Self-pay

## 2021-06-17 ENCOUNTER — Other Ambulatory Visit: Payer: Self-pay

## 2021-06-17 ENCOUNTER — Encounter (HOSPITAL_COMMUNITY): Payer: Self-pay | Admitting: Physician Assistant

## 2021-06-17 ENCOUNTER — Telehealth (INDEPENDENT_AMBULATORY_CARE_PROVIDER_SITE_OTHER): Payer: No Payment, Other | Admitting: Physician Assistant

## 2021-06-17 DIAGNOSIS — R41840 Attention and concentration deficit: Secondary | ICD-10-CM

## 2021-06-17 DIAGNOSIS — F418 Other specified anxiety disorders: Secondary | ICD-10-CM

## 2021-06-17 MED ORDER — ATOMOXETINE HCL 25 MG PO CAPS
25.0000 mg | ORAL_CAPSULE | Freq: Every day | ORAL | 1 refills | Status: DC
  Filled 2021-06-18: qty 30, 30d supply, fill #0
  Filled 2021-07-16: qty 30, 30d supply, fill #1

## 2021-06-17 MED ORDER — QUETIAPINE FUMARATE 50 MG PO TABS
50.0000 mg | ORAL_TABLET | Freq: Every day | ORAL | 1 refills | Status: DC
Start: 2021-06-17 — End: 2021-08-19
  Filled 2021-06-17: qty 30, 30d supply, fill #0
  Filled 2021-07-29: qty 30, 30d supply, fill #1

## 2021-06-17 MED ORDER — SERTRALINE HCL 100 MG PO TABS
ORAL_TABLET | Freq: Every day | ORAL | 1 refills | Status: DC
  Filled 2021-06-17: qty 30, 30d supply, fill #0
  Filled 2021-07-29: qty 30, 30d supply, fill #1

## 2021-06-17 NOTE — Progress Notes (Signed)
[place] MD/PA/NP OP Progress Note  Virtual Visit via Telephone Note  I connected with Patty Stanley on 06/17/21 at  1:30 PM EST by telephone and verified that I am speaking with the correct person using two identifiers.  Location: Patient: Home Provider: Clinic   I discussed the limitations, risks, security and privacy concerns of performing an evaluation and management service by telephone and the availability of in person appointments. I also discussed with the patient that there may be a patient responsible charge related to this service. The patient expressed understanding and agreed to proceed.  Follow Up Instructions:   I discussed the assessment and treatment plan with the patient. The patient was provided an opportunity to ask questions and all were answered. The patient agreed with the plan and demonstrated an understanding of the instructions.   The patient was advised to call back or seek an in-person evaluation if the symptoms worsen or if the condition fails to improve as anticipated.  I provided 16 minutes of non-face-to-face time during this encounter.  [name], PA    06/17/2021 10:26 PM Aavya Shafer  MRN:  [id number]  Chief Complaint: Follow up and medication management  HPI:   Patty Stanley is a 41 year old female with a past psychiatric history significant for depression, anxiety, and attention/concentration deficit who presents to [facility] via virtual telephone visit for follow-up medication management.  Patient is currently being managed on the following medications:  Strattera 25 mg daily Buspirone 7.5 mg 2 times daily Seroquel 50 mg at bedtime Sertraline 100 mg daily  Patient reports no issues or concerns regarding her current medication regimen.  Patient denies the need for dosage adjustments at this time and is requesting refills on her medications following the conclusion of the encounter.   Patient states that she has been experiencing twitching at night.  She reports that the twitching occurs all over and has been going on for a few months.  Patient is not sure if her twitching started with the introduction to new medications.  Patient denies depressive symptoms but does endorse elevated anxiety she attributes to worrying over her husband's multiple medical conditions.  Patient rates her anxiety at 4 out of 10 but states that it depends on what stressors she is dealing with.  Patient denies any other issues or concerns regarding her mental health.  A GAD-7 screen was performed with the patient scoring an 11.  Patient is alert and oriented x4, calm, cooperative, and fully engaged in conversation during the encounter.  Patient endorses okay mood.  Patient denies suicidal or homicidal ideations.  She further denies auditory or visual hallucinations and does not appear to be responding to internal/external stimuli.  Patient endorses fair sleep and receives on average 6 hours of sleep each night.  Patient endorses good appetite and eats on average 1-2 meals per day.  Patient denies alcohol consumption, tobacco use, and illicit drug use.  Visit Diagnosis:    ICD-10-CM   1. Attention and concentration deficit  R41.840 atomoxetine (STRATTERA) 25 MG capsule    2. Depression with anxiety  F41.8 sertraline (ZOLOFT) 100 MG tablet    QUEtiapine (SEROQUEL) 50 MG tablet      Past Psychiatric History:  Depression Anxiety  Past Medical History:  Past Medical History:  Diagnosis Date   Alcohol abuse    ALLERGIC RHINITIS    Anxiety    ASTHMA    DEPRESSION    Headache(784.0)    OSTEOARTHRITIS  UNSPECIFIED TACHYCARDIA     Past Surgical History:  Procedure Laterality Date   WISDOM TOOTH EXTRACTION      Family Psychiatric History:  Patient is adopted  Family History:  Family History  Adopted: Yes  Problem Relation Age of Onset   Alcohol abuse Other    Arthritis Other      Social History:  Social History   Socioeconomic History   Marital status: Married    Spouse name: Not on file   Number of children: Not on file   Years of education: Not on file   Highest education level: Not on file  Occupational History   Occupation: [employer]    Employer: [employer]  Tobacco Use   Smoking status: Never   Smokeless tobacco: Never  Substance and Sexual Activity   Alcohol use: No   Drug use: No   Sexual activity: Yes    Birth control/protection: Pill  Other Topics Concern   Not on file  Social History Narrative   Not on file   Social Determinants of Health   Financial Resource Strain: Not on file  Food Insecurity: Not on file  Transportation Needs: Not on file  Physical Activity: Not on file  Stress: Not on file  Social Connections: Not on file    Allergies:  Allergies  Allergen Reactions   Penicillins Hives    REACTION: Itching    Metabolic Disorder Labs: Lab Results  Component Value Date   HGBA1C 5.7 11/29/2012   No results found for: PROLACTIN Lab Results  Component Value Date   CHOL 167 11/29/2012   TRIG 113.0 11/29/2012   HDL 49.20 11/29/2012   CHOLHDL 3 11/29/2012   VLDL 22.6 11/29/2012   LDLCALC 95 11/29/2012   Lab Results  Component Value Date   TSH 1.12 11/29/2012   TSH 2.23 12/10/2010    Therapeutic Level Labs: No results found for: LITHIUM No results found for: VALPROATE No components found for:  CBMZ  Current Medications: Current Outpatient Medications  Medication Sig Dispense Refill   albuterol (PROVENTIL HFA) 108 (90 BASE) MCG/ACT inhaler Inhale 2 puffs into the lungs every 6 (six) hours as needed for wheezing. 3 Inhaler 3   albuterol (VENTOLIN HFA) 108 (90 Base) MCG/ACT inhaler Inhale 1 puff into the lungs every 6 hours as needed. 8.5 g 0   albuterol (VENTOLIN HFA) 108 (90 Base) MCG/ACT inhaler Inhale 1 puff by mouth into the lungs every 8 hours as needed 8.5 g 0   atomoxetine (STRATTERA) 25 MG  capsule Take 1 capsule by mouth daily. 30 capsule 1   azithromycin (ZITHROMAX) 250 MG tablet Take 2 tablets by mouth today then take 1 tablet by mouth daily for 4 days 18 tablet 0   beclomethasone (QVAR) 80 MCG/ACT inhaler Inhale 1 puff into the lungs 2 (two) times daily. (Patient taking differently: Inhale 1 puff into the lungs 2 (two) times daily as needed (asthma). ) 3 Inhaler 3   budesonide-formoterol (SYMBICORT) 160-4.5 MCG/ACT inhaler Inhale 1 puff into the lungs twice daily as needed. 10.2 g 0   busPIRone (BUSPAR) 7.5 MG tablet Take 1 tablet (7.5 mg total) by mouth 2 (two) times daily. 60 tablet 1   cetirizine (ZYRTEC) 10 MG tablet Take 10 mg by mouth daily.     chlorhexidine (PERIDEX) 0.12 % solution Use 33ml to gargle and spit twice daily. 473 mL 11   clindamycin (CLEOCIN) 300 MG capsule TAKE 1 CAPSULE BY MOUTH EVERY 6 HOURS 40 capsule 0  desloratadine (CLARINEX) 5 MG tablet Take 1 tablet (5 mg total) by mouth daily. 90 tablet 3   doxycycline (VIBRAMYCIN) 100 MG capsule Take 1 capsule by mouth every 12 hours for 10 days 20 capsule 0   ergocalciferol (VITAMIN D2) 1.25 MG (50000 UT) capsule Take 1 capsule (1.25 mg) by mouth weekly 12 capsule 0   hydrOXYzine (ATARAX) 25 MG tablet TAKE 1 TABLET BY MOUTH 3 TIMES DAILY AS NEEDED FOR ANXIETY. 90 tablet 2   levocetirizine (XYZAL) 5 MG tablet TAKE ONE TABLET BY MOUTH DAILY IN THE EVENING 90 tablet 3   meloxicam (MOBIC) 15 MG tablet TAKE 1 TABLET BY MOUTH DAILY 10 tablet 0   mometasone (NASONEX) 50 MCG/ACT nasal spray Place 24 sprays into the nose daily. (Patient not taking: Reported on 08/20/2014) 51 g 3   montelukast (SINGULAIR) 10 MG tablet Take 1 tablet (10 mg total) by mouth daily. 90 tablet 3   montelukast (SINGULAIR) 10 MG tablet TAKE 1 TABLET BY MOUTH ONCE DAILY 90 tablet 0   montelukast (SINGULAIR) 10 MG tablet Take 1 tablet by mouth daily 90 tablet 0   nebivolol (BYSTOLIC) 5 MG tablet TAKE 1 TABLET BY MOUTH ONCE A DAY 90 tablet 0    nebivolol (BYSTOLIC) 5 MG tablet TAKE 1 TABLET BY MOUTH DAILY 90 tablet 0   nebivolol (BYSTOLIC) 5 MG tablet Take 1 tablet (5 mg) by mouth daily 90 tablet 0   norgestimate-ethinyl estradiol (ORTHO-CYCLEN) 0.25-35 MG-MCG tablet TAKE 1 TABLET BY MOUTH ONCE DAILY CONTINUOUSLY AS DIRECTED 84 tablet 0   norgestimate-ethinyl estradiol (VYLIBRA) 0.25-35 MG-MCG tablet Take 1 tablet by mouth once daily as directed-ok continuous dosing 84 tablet 0   omeprazole (PRILOSEC) 20 MG capsule TAKE 1 CAPSULE BY MOUTH ONCE DAILY 90 capsule 0   omeprazole (PRILOSEC) 20 MG capsule TAKE 1 CAPSULE BY MOUTH ONCE DAILY 90 capsule 0   omeprazole (PRILOSEC) 40 MG capsule Take 40 mg by mouth daily.     predniSONE (DELTASONE) 10 MG tablet Take 1 tablet by mouth daily 10 tablet 0   QUEtiapine (SEROQUEL) 50 MG tablet Take 1 tablet (50 mg total) by mouth at bedtime. 30 tablet 1   sertraline (ZOLOFT) 100 MG tablet TAKE 1 TABLET BY MOUTH DAILY. 30 tablet 1   No current facility-administered medications for this visit.     Musculoskeletal: Strength & Muscle Tone: Unable to assess due to telemedicine visit [place]: Unable to assess due to telemedicine visit Patient leans: Unable to assess due to telemedicine visit  Psychiatric Specialty Exam: Review of Systems  Psychiatric/Behavioral:  Positive for sleep disturbance. Negative for decreased concentration, dysphoric mood, hallucinations, self-injury and suicidal ideas. The patient is nervous/anxious. The patient is not hyperactive.    There were no vitals taken for this visit.There is no height or weight on file to calculate BMI.  General Appearance: Unable to assess due to telemedicine visit  Eye Contact: Unable to assess due to telemedicine visit   Speech:  Clear and Coherent and Normal Rate  Volume:  Normal  Mood:  Anxious and Euthymic  Affect:  Appropriate and Congruent  Thought Process:  Coherent and Descriptions of Associations: Intact  Orientation:  Full  (Time, Place, and Person)  Thought Content: WDL   Suicidal Thoughts:  No  Homicidal Thoughts:  No  Memory:  Immediate;   Good Recent;   Good Remote;   Good  Judgement:  Fair  Insight:  Fair  Psychomotor Activity:  Normal  Concentration:  Concentration:  Good and Attention Span: Good  Recall:  Good  Fund of Knowledge: Good  Language: Good  Akathisia:  No  Handed:  Right  AIMS (if indicated): not done  Assets:  Communication Skills Desire for Improvement Financial Resources/Insurance Housing Social Support  ADL's:  Intact  Cognition: Impaired,  Mild  Sleep:  Fair   Screenings: GAD-7    Flowsheet Row Video Visit from 06/17/2021 in [facility] Video Visit from 04/19/2021 in [place] from 02/12/2021 in [facility] Video Visit from 12/11/2020 in [facility]  Total GAD-7 Score 11 18 18 13       PHQ2-9    Flowsheet Row Video Visit from 06/17/2021 in [facility] Video Visit from 04/19/2021 in [place] from 02/12/2021 in [facility] Video Visit from 12/11/2020 in [facility] Counselor from 07/15/2020 in [facility]  PHQ-2 Total Score 1 1 0 1 4  PHQ-9 Total Score -- -- -- -- 15      Flowsheet Row Video Visit from 06/17/2021 in [facility] Video Visit from 04/19/2021 in [place] from 02/12/2021 in [place] Low Risk Low Risk Low Risk        Assessment and Plan:   Tacie Mccuistion is a 41 year old female with a past psychiatric history significant for depression, anxiety, and attention/concentration deficit who presents to [facility] via virtual telephone visit for follow-up medication management.  Patient reports no major issues or concerns regarding her current medication regimen but does state that she has been experiencing twitching for the past couple of months.  Patient is unable to identify the cause of her twitching.  Patient denies experiencing any depressive symptoms but does endorse anxiety attributed to her husband's medical conditions.  Patient is requesting refills on her medication following the conclusion of the encounter.  Provider to provide samples of [place] for patient to determine if her twitching is due to the use of Seroquel.  Patient to be contacted tomorrow to pick up samples.  1. Attention and concentration deficit  - atomoxetine (STRATTERA) 25 MG capsule; Take 1 capsule by mouth daily.  Dispense: 30 capsule; Refill: 1  2. Depression with anxiety  - sertraline (ZOLOFT) 100 MG tablet; TAKE 1 TABLET BY MOUTH DAILY.  Dispense: 30 tablet; Refill: 1 - QUEtiapine (SEROQUEL) 50 MG tablet; Take 1 tablet (50 mg total) by mouth at bedtime.  Dispense: 30 tablet; Refill: 1  Patient to follow up in 2 months Provider spent a total of 16 minutes with the patient/reviewing patient's chart  [name], PA 06/17/2021, 10:26 PM

## 2021-06-18 ENCOUNTER — Other Ambulatory Visit (HOSPITAL_COMMUNITY): Payer: Self-pay

## 2021-06-28 ENCOUNTER — Other Ambulatory Visit (HOSPITAL_COMMUNITY): Payer: Self-pay

## 2021-06-30 ENCOUNTER — Other Ambulatory Visit (HOSPITAL_COMMUNITY): Payer: Self-pay

## 2021-07-08 ENCOUNTER — Other Ambulatory Visit (HOSPITAL_COMMUNITY): Payer: Self-pay

## 2021-07-08 MED ORDER — MONTELUKAST SODIUM 10 MG PO TABS
ORAL_TABLET | Freq: Every day | ORAL | 0 refills | Status: DC
  Filled 2021-07-08: qty 90, 90d supply, fill #0

## 2021-07-08 MED ORDER — NEBIVOLOL HCL 5 MG PO TABS
ORAL_TABLET | ORAL | 0 refills | Status: DC
  Filled 2021-07-08: qty 90, 90d supply, fill #0

## 2021-07-09 ENCOUNTER — Other Ambulatory Visit (HOSPITAL_COMMUNITY): Payer: Self-pay

## 2021-07-16 ENCOUNTER — Other Ambulatory Visit (HOSPITAL_COMMUNITY): Payer: Self-pay

## 2021-07-16 ENCOUNTER — Other Ambulatory Visit (HOSPITAL_COMMUNITY): Payer: Self-pay | Admitting: Physician Assistant

## 2021-07-16 DIAGNOSIS — F418 Other specified anxiety disorders: Secondary | ICD-10-CM

## 2021-07-20 ENCOUNTER — Other Ambulatory Visit (HOSPITAL_COMMUNITY): Payer: Self-pay

## 2021-07-20 MED ORDER — BUSPIRONE HCL 7.5 MG PO TABS
7.5000 mg | ORAL_TABLET | Freq: Two times a day (BID) | ORAL | 1 refills | Status: DC
  Filled 2021-07-20: qty 60, 30d supply, fill #0
  Filled 2021-08-17: qty 60, 30d supply, fill #1

## 2021-07-21 ENCOUNTER — Other Ambulatory Visit (HOSPITAL_COMMUNITY): Payer: Self-pay

## 2021-07-23 ENCOUNTER — Other Ambulatory Visit (HOSPITAL_COMMUNITY): Payer: Self-pay

## 2021-07-28 ENCOUNTER — Ambulatory Visit (HOSPITAL_COMMUNITY): Payer: No Payment, Other | Admitting: Licensed Clinical Social Worker

## 2021-07-28 DIAGNOSIS — F79 Unspecified intellectual disabilities: Secondary | ICD-10-CM

## 2021-07-28 DIAGNOSIS — F411 Generalized anxiety disorder: Secondary | ICD-10-CM

## 2021-07-28 NOTE — Progress Notes (Signed)
? ?THERAPIST PROGRESS NOTE ? ?Session Time: 34 min. ? ?Participation Level: Active ? ?Behavioral Response: CasualAlertAnxious and Dysphoric ? ?Type of Therapy: Individual Therapy ? ?Treatment Goals addressed: anx/dep/stressors/coping ? ?ProgressTowards Goals: Progressing minimally d/t ongoing situational stressors ? ?Interventions: Solution Focused and Supportive ? ?Summary: Patty Stanley is a 41 y.o. female who presents with hx of anx/dep/IDD.  Today patient comes for in person session for the first time.  Last session May 19, 2021. Patty Stanley states her mother-in-law is currently at her house again and she decided to come in person because she does not feel like she can be assured privacy in her home right now.  Mother-in-law continues to spend a month at her sister-in-law's home and then a month with patient and her husband.  Today she reports the mother-in-law is a "drama queen" and shares some examples of what she means by this.  Patient also shares that her husband is continuing to have unmanaged pain which makes him extremely irritable and he takes his irritability out on pt. Patty Stanley has multiple complaints about the pain management clinic where her husband goes.  LCSW encouraged her to explore other options along with her husband if they are unsatisfied with care.  LCSW assessed for status of patient taking her own medications.  Patient reports no med adjustments at last med appointment and she is taking medications as prescribed.  She does state some of her medications are very expensive and she has a hard time affording them but makes meds a priority in her budget.  LCSW again provided information and referral to [place].  LCSW provided information in writing and patient agrees to call post session to price her prescriptions there.  Patient reports her anxiety has been better managed yet feels like lately she is struggling more with all she has to juggle since her mother-in-law is in the home.   Patient advises her mother is currently in [place] and she states her mother is normally a sounding board for her and supportive which helps her to cope.  She advises her mother will be gone for 11 days.  LCSW assisted patient to address coping strategies.  Patient reports she continues to love sewing and has just joined a sewing class her mother helped her get into.  She gets her phone out and shares what they have been working on.  LCSW encouraged self-care, positive distractions, walking away for a cooling down period when there is conflict in the household, exercise, music, mediation.  Today LCSW advises patient this clinician has resigned counseling position at [facility].  LCSW assisted patient to process thoughts and feelings regarding change in care.  LCSW provided information on transition plan.  Patient verbalizes understanding and states appreciation for care. ? ?Suicidal/Homicidal: Nowithout intent/plan ? ?Therapist Response: Pt open to care. ? ?Plan: Return again for next avail appt with new clinician as this LCSW resigned position at [facility]. ? ?Diagnosis: No diagnosis found. ? ?Collaboration of Care: Other None deemed necessary this session. ? ?Patient/Guardian was advised Release of Information must be obtained prior to any record release in order to collaborate their care with an outside provider. Patient/Guardian was advised if they have not already done so to contact the registration department to sign all necessary forms in order for [place] to release information regarding their care.  ? ?Consent: Patient/Guardian gives verbal consent for treatment and assignment of benefits for services provided during this visit. Patient/Guardian expressed understanding and agreed to proceed.  ? ?Geni Bers  Epimenio Sarin, LCSW ?07/28/2021 ? ?

## 2021-07-29 ENCOUNTER — Other Ambulatory Visit (HOSPITAL_COMMUNITY): Payer: Self-pay

## 2021-08-17 ENCOUNTER — Other Ambulatory Visit (HOSPITAL_COMMUNITY): Payer: Self-pay

## 2021-08-19 ENCOUNTER — Telehealth (INDEPENDENT_AMBULATORY_CARE_PROVIDER_SITE_OTHER): Payer: No Payment, Other | Admitting: Physician Assistant

## 2021-08-19 ENCOUNTER — Other Ambulatory Visit (HOSPITAL_COMMUNITY): Payer: Self-pay

## 2021-08-19 ENCOUNTER — Encounter (HOSPITAL_COMMUNITY): Payer: Self-pay | Admitting: Physician Assistant

## 2021-08-19 DIAGNOSIS — R41840 Attention and concentration deficit: Secondary | ICD-10-CM | POA: Diagnosis not present

## 2021-08-19 DIAGNOSIS — F418 Other specified anxiety disorders: Secondary | ICD-10-CM | POA: Diagnosis not present

## 2021-08-19 MED ORDER — QUETIAPINE FUMARATE 50 MG PO TABS
50.0000 mg | ORAL_TABLET | Freq: Every day | ORAL | 1 refills | Status: DC
  Filled 2021-08-19: qty 30, 30d supply, fill #0

## 2021-08-19 MED ORDER — SERTRALINE HCL 100 MG PO TABS
ORAL_TABLET | Freq: Every day | ORAL | 1 refills | Status: DC
  Filled 2021-08-19: qty 30, 30d supply, fill #0
  Filled 2021-09-30: qty 30, 30d supply, fill #1

## 2021-08-19 MED ORDER — ATOMOXETINE HCL 25 MG PO CAPS
25.0000 mg | ORAL_CAPSULE | Freq: Every day | ORAL | 1 refills | Status: DC
  Filled 2021-08-19: qty 30, 30d supply, fill #0
  Filled 2021-09-23: qty 30, 30d supply, fill #1

## 2021-08-19 NOTE — Progress Notes (Addendum)
BH MD/PA/NP OP Progress Note ? ?Virtual Visit via Telephone Note ? ?I connected with Marlei Stanley on 08/19/21 at  1:30 PM EDT by telephone and verified that I am speaking with the correct person using two identifiers. ? ?Location: ?Patient: Home ?Provider: Clinic ?  ?I discussed the limitations, risks, security and privacy concerns of performing an evaluation and management service by telephone and the availability of in person appointments. I also discussed with the patient that there may be a patient responsible charge related to this service. The patient expressed understanding and agreed to proceed. ? ?Follow Up Instructions: ?  ?I discussed the assessment and treatment plan with the patient. The patient was provided an opportunity to ask questions and all were answered. The patient agreed with the plan and demonstrated an understanding of the instructions. ?  ?The patient was advised to call back or seek an in-person evaluation if the symptoms worsen or if the condition fails to improve as anticipated. ? ?I provided 19 minutes of non-face-to-face time during this encounter. ? ?Patty Mood, PA ? ? ?08/19/2021 5:57 PM ?Patty Stanley  ?MRN:  [id number] ? ?Chief Complaint:  ?Chief Complaint  ?Patient presents with  ? Follow-up  ? ?HPI:  ? ?Patty Stanley is a 41 year old female with a past psychiatric history significant for depression with anxiety and attention/concentration deficit who presents to [facility] via virtual telephone visit for follow-up and medication management.  Patient is currently being managed on the following medications: ? ?Atomoxetine (Strattera) 25 mg daily ?Sertraline (Zoloft) 100 mg daily ?Seroquel 50 mg at bedtime ? ?Patient reports that she has been having hallucinations and attributes her hallucinations to being under a lot of stress.  Patient endorses the following stressors: car breaking down, has been suffering a tear in his  calf muscle, and has been suffering a nosebleed.  Patient's hallucinations are characterized by the sensation of spiders crawling on her but she is unable to see them.  Patient reports that she recently had a blow out with her husband's pain management provider due to believing that they were not listening to his needs.  Patient reports that her husband suffers from chronic hip pain as well as sciatica pain and she believes that the pain management staff managing her husband's pain are not doing their job. ? ?Patient denies experiencing depressive symptoms but she does endorse anxiety.  Patient rates her anxiety between a 4 and 6 out of 10.  She states that the level of her anxiety depends on the situation going on around her.  Patient reports that she also continues to experience jerking motions in her body which she attributes to her Strattera usage.  A PHQ-9 screen was performed with the patient scoring a 12.  A GAD-7 screen was performed with the patient scoring a 16. ? ?Patient is alert and oriented x4, calm, cooperative, and fully engaged in conversation during the encounter.  Patient endorses okay Stanley.  Patient denies suicidal or homicidal ideations.  She further denies auditory or visual hallucinations and does not appear to be responding to internal/external stimuli.  Patient endorses fair sleep and receives on average 6 hours of sleep each night.  Patient endorses decreased appetite and eats on average 1 meal per day.  Patient denies alcohol consumption stating that she has not drinking alcohol in the last 15 years.  Patient denies tobacco use and illicit drug use. ? ?Visit Diagnosis:  ?  ICD-10-CM   ?1. Depression with anxiety  F41.8 sertraline (ZOLOFT) 100 MG tablet  ?  QUEtiapine (SEROQUEL) 50 MG tablet  ?  ?2. Attention and concentration deficit  R41.840 atomoxetine (STRATTERA) 25 MG capsule  ?  ? ? ?Past Psychiatric History:  ?Depression ?Anxiety ? ?Past Medical History:  ?Past Medical History:   ?Diagnosis Date  ? Alcohol abuse   ? ALLERGIC RHINITIS   ? Anxiety   ? ASTHMA   ? DEPRESSION   ? Headache(784.0)   ? OSTEOARTHRITIS   ? UNSPECIFIED TACHYCARDIA   ?  ?Past Surgical History:  ?Procedure Laterality Date  ? WISDOM TOOTH EXTRACTION    ? ? ?Family Psychiatric History:  ?Patient is adopted ? ?Family History:  ?Family History  ?Adopted: Yes  ?Problem Relation Age of Onset  ? Alcohol abuse Other   ? Arthritis Other   ? ? ?Social History:  ?Social History  ? ?Socioeconomic History  ? Marital status: Married  ?  Spouse name: Not on file  ? Number of children: Not on file  ? Years of education: Not on file  ? Highest education level: Not on file  ?Occupational History  ? Occupation: [employer]  ?  Employer: [employer]  ?Tobacco Use  ? Smoking status: Never  ? Smokeless tobacco: Never  ?Substance and Sexual Activity  ? Alcohol use: No  ? Drug use: No  ? Sexual activity: Yes  ?  Birth control/protection: Pill  ?Other Topics Concern  ? Not on file  ?Social History Narrative  ? Not on file  ? ?Social Determinants of Health  ? ?Financial Resource Strain: Not on file  ?Food Insecurity: Not on file  ?Transportation Needs: Not on file  ?Physical Activity: Not on file  ?Stress: Not on file  ?Social Connections: Not on file  ? ? ?Allergies:  ?Allergies  ?Allergen Reactions  ? Penicillins Hives  ?  REACTION: Itching  ? ? ?Metabolic Disorder Labs: ?Lab Results  ?Component Value Date  ? HGBA1C 5.7 11/29/2012  ? ?No results found for: PROLACTIN ?Lab Results  ?Component Value Date  ? CHOL 167 11/29/2012  ? TRIG 113.0 11/29/2012  ? HDL 49.20 11/29/2012  ? CHOLHDL 3 11/29/2012  ? VLDL 22.6 11/29/2012  ? [place] 95 11/29/2012  ? ?Lab Results  ?Component Value Date  ? TSH 1.12 11/29/2012  ? TSH 2.23 12/10/2010  ? ? ?Therapeutic Level Labs: ?No results found for: LITHIUM ?No results found for: VALPROATE ?No components found for:  CBMZ ? ?Current Medications: ?Current Outpatient Medications  ?Medication Sig Dispense Refill  ?  albuterol (PROVENTIL HFA) 108 (90 BASE) MCG/ACT inhaler Inhale 2 puffs into the lungs every 6 (six) hours as needed for wheezing. 3 Inhaler 3  ? albuterol (VENTOLIN HFA) 108 (90 Base) MCG/ACT inhaler Inhale 1 puff into the lungs every 6 hours as needed. 8.5 g 0  ? albuterol (VENTOLIN HFA) 108 (90 Base) MCG/ACT inhaler Inhale 1 puff by mouth into the lungs every 8 hours as needed 8.5 g 0  ? atomoxetine (STRATTERA) 25 MG capsule Take 1 capsule by mouth daily. 30 capsule 1  ? azithromycin (ZITHROMAX) 250 MG tablet Take 2 tablets by mouth today then take 1 tablet by mouth daily for 4 days 18 tablet 0  ? beclomethasone (QVAR) 80 MCG/ACT inhaler Inhale 1 puff into the lungs 2 (two) times daily. (Patient taking differently: Inhale 1 puff into the lungs 2 (two) times daily as needed (asthma). ) 3 Inhaler 3  ? budesonide-formoterol (SYMBICORT) 160-4.5 MCG/ACT inhaler Inhale 1 puff into  the lungs twice daily as needed. 10.2 g 0  ? busPIRone (BUSPAR) 7.5 MG tablet Take 1 tablet (7.5 mg total) by mouth 2 (two) times daily. 60 tablet 1  ? cetirizine (ZYRTEC) 10 MG tablet Take 10 mg by mouth daily.    ? chlorhexidine (PERIDEX) 0.12 % solution Use 65m to gargle and spit twice daily. 473 mL 11  ? desloratadine (CLARINEX) 5 MG tablet Take 1 tablet (5 mg total) by mouth daily. 90 tablet 3  ? doxycycline (VIBRAMYCIN) 100 MG capsule Take 1 capsule by mouth every 12 hours for 10 days 20 capsule 0  ? ergocalciferol (VITAMIN D2) 1.25 MG (50000 UT) capsule Take 1 capsule (1.25 mg) by mouth weekly 12 capsule 0  ? hydrOXYzine (ATARAX) 25 MG tablet TAKE 1 TABLET BY MOUTH 3 TIMES DAILY AS NEEDED FOR ANXIETY. 90 tablet 2  ? levocetirizine (XYZAL) 5 MG tablet TAKE ONE TABLET BY MOUTH DAILY IN THE EVENING 90 tablet 3  ? mometasone (NASONEX) 50 MCG/ACT nasal spray Place 24 sprays into the nose daily. (Patient not taking: Reported on 08/20/2014) 51 g 3  ? montelukast (SINGULAIR) 10 MG tablet Take 1 tablet (10 mg total) by mouth daily. 90 tablet 3  ?  montelukast (SINGULAIR) 10 MG tablet TAKE 1 TABLET BY MOUTH ONCE DAILY 90 tablet 0  ? montelukast (SINGULAIR) 10 MG tablet Take 1 tablet by mouth daily 90 tablet 0  ? nebivolol (BYSTOLIC) 5 MG tablet TAKE 1

## 2021-08-20 ENCOUNTER — Other Ambulatory Visit (HOSPITAL_COMMUNITY): Payer: Self-pay

## 2021-08-25 ENCOUNTER — Other Ambulatory Visit (HOSPITAL_COMMUNITY): Payer: Self-pay

## 2021-08-27 ENCOUNTER — Other Ambulatory Visit (HOSPITAL_COMMUNITY): Payer: Self-pay

## 2021-08-27 MED ORDER — HYDROXYZINE HCL 25 MG PO TABS
ORAL_TABLET | Freq: Three times a day (TID) | ORAL | 0 refills | Status: DC | PRN
  Filled 2021-08-27: qty 90, 30d supply, fill #0

## 2021-08-27 MED ORDER — OMEPRAZOLE 20 MG PO CPDR
20.0000 mg | DELAYED_RELEASE_CAPSULE | Freq: Every day | ORAL | 0 refills | Status: DC
  Filled 2021-08-27: qty 90, 90d supply, fill #0

## 2021-09-01 ENCOUNTER — Ambulatory Visit (INDEPENDENT_AMBULATORY_CARE_PROVIDER_SITE_OTHER): Payer: No Payment, Other | Admitting: Licensed Clinical Social Worker

## 2021-09-01 ENCOUNTER — Encounter (HOSPITAL_COMMUNITY): Payer: Self-pay

## 2021-09-01 DIAGNOSIS — F411 Generalized anxiety disorder: Secondary | ICD-10-CM | POA: Diagnosis not present

## 2021-09-01 NOTE — Plan of Care (Signed)
?  Problem: Anxiety Disorder CCP Problem  1 Learn and Apply Coping Skills to Decrease Anxiety Symptoms   ?Goal: LTG: Patient will score less than 5 on the Generalized Anxiety Disorder 7 Scale (GAD-7) ?Outcome: Not Applicable ?Goal: STG: Patient will participate in at least 80% of scheduled individual psychotherapy sessions ?Outcome: Not Applicable ?Goal: STG: Patient will complete at least 80% of assigned homework ?Outcome: Not Applicable ?Goal: STG: Report a decrease in anxiety symptoms as evidenced by an overall reduction in anxiety score by a minimum of 25% on the Generalized Anxiety Disorder Scale ?Outcome: Not Applicable ?  ?

## 2021-09-01 NOTE — Progress Notes (Signed)
? ?  THERAPIST PROGRESS NOTE ? ?Session Time: 53 minutes ? ?Participation Level: Active ? ?Behavioral Response: CasualAlertAnxious and Irritable ? ?Type of Therapy: Individual Therapy ? ?Treatment Goals addressed: establish tx goals ? ?ProgressTowards Goals: Initial ? ?Interventions: Solution Focused, Strength-based, and Supportive ? ?Summary: Patty Stanley (Pronounced "UH-dee-uh") is a 41 y.o. female who presents for initial visit with this cln due to previous therapist resigning. 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 States he is an "ass" when he doesn't have pain meds. She denies physical and verbal abuse. States she "cussed out" his pain clinic staff due to the PA that saw him stating she was going to stop prescribing pain medicine and she reports she is no longer allowed in the building. She states her husband specifically asked her to discuss the incident with cln. She reports her symptoms are anger and irritability and anxiety. She states, "Sometimes I can't tell if it's anxiety or panic." She reports she frequently feels anxious when in public with her husband because "He'll say whatever he wants." She endorses he intentionally antagonizes others, such as calling servers stupid, but she states he chastises her for making similar statements to others because "you might get shot." Pt states her husband has not verbalized why he holds this double standard. She reports she previously struggled with alcohol abuse and has been sober for 18 years. She also states she has an "art room" at home and she discusses her pets. She is noted to be brighter when talking about her cat and dog (cat: Patty Stanley, dog: Patty Stanley) and shares pictures with cln. She agrees to proposed tx plan and signs. ? ?Suicidal/Homicidal: Nowithout intent/plan ? ?Therapist Response: Cln introduced self and asked pt to identify pertinent  background information, stressors, current symptoms, and treatment goals. Cln developed tx plan to assess progress based on pt's stated goals. Cln utilized active listening and focused on building rapport and encouraged pt to provide feedback to cln in future sessions. Cln instructed pt to schedule f/u with office staff for next available appt. ? ?Plan: Return again in 3-4 weeks. ? ?Diagnosis: GAD (generalized anxiety disorder) ? ?Collaboration of Care: Other chart review of previous therapist's notes. ? ?Patient/Guardian was advised Release of Information must be obtained prior to any record release in order to collaborate their care with an outside provider. Patient/Guardian was advised if they have not already done so to contact the registration department to sign all necessary forms in order for [place] to release information regarding their care.  ? ?Consent: Patient/Guardian gives verbal consent for treatment and assignment of benefits for services provided during this visit. Patient/Guardian expressed understanding and agreed to proceed.  ? ?Heron Nay, LCSWA ?09/01/2021 ? ?

## 2021-09-09 ENCOUNTER — Other Ambulatory Visit (HOSPITAL_COMMUNITY): Payer: Self-pay

## 2021-09-23 ENCOUNTER — Other Ambulatory Visit (HOSPITAL_COMMUNITY): Payer: Self-pay | Admitting: Physician Assistant

## 2021-09-23 ENCOUNTER — Other Ambulatory Visit (HOSPITAL_COMMUNITY): Payer: Self-pay

## 2021-09-23 DIAGNOSIS — F418 Other specified anxiety disorders: Secondary | ICD-10-CM

## 2021-09-24 ENCOUNTER — Other Ambulatory Visit (HOSPITAL_COMMUNITY): Payer: Self-pay

## 2021-09-24 MED ORDER — HYDROXYZINE HCL 25 MG PO TABS
ORAL_TABLET | Freq: Three times a day (TID) | ORAL | 0 refills | Status: DC | PRN
  Filled 2021-09-24: qty 90, 30d supply, fill #0

## 2021-09-24 MED ORDER — BUSPIRONE HCL 7.5 MG PO TABS
7.5000 mg | ORAL_TABLET | Freq: Two times a day (BID) | ORAL | 1 refills | Status: DC
  Filled 2021-09-24: qty 60, 30d supply, fill #0

## 2021-09-27 ENCOUNTER — Telehealth (HOSPITAL_COMMUNITY): Payer: Self-pay | Admitting: Licensed Clinical Social Worker

## 2021-09-27 ENCOUNTER — Ambulatory Visit (HOSPITAL_COMMUNITY): Payer: No Payment, Other | Admitting: Licensed Clinical Social Worker

## 2021-09-27 NOTE — Telephone Encounter (Signed)
See call intake 

## 2021-09-27 NOTE — Telephone Encounter (Signed)
Patient lvm, no info... returned call no ans LVM ?

## 2021-09-30 ENCOUNTER — Other Ambulatory Visit (HOSPITAL_COMMUNITY): Payer: Self-pay

## 2021-10-01 ENCOUNTER — Other Ambulatory Visit (HOSPITAL_COMMUNITY): Payer: Self-pay

## 2021-10-12 ENCOUNTER — Other Ambulatory Visit (HOSPITAL_COMMUNITY): Payer: Self-pay

## 2021-10-12 MED ORDER — NEBIVOLOL HCL 5 MG PO TABS
ORAL_TABLET | ORAL | 0 refills | Status: DC
  Filled 2021-10-12: qty 90, 90d supply, fill #0

## 2021-10-12 MED ORDER — MONTELUKAST SODIUM 10 MG PO TABS
ORAL_TABLET | Freq: Every day | ORAL | 0 refills | Status: DC
  Filled 2021-10-12: qty 90, 90d supply, fill #0

## 2021-10-13 ENCOUNTER — Other Ambulatory Visit (HOSPITAL_COMMUNITY): Payer: Self-pay

## 2021-10-13 ENCOUNTER — Ambulatory Visit (INDEPENDENT_AMBULATORY_CARE_PROVIDER_SITE_OTHER): Payer: No Payment, Other | Admitting: Licensed Clinical Social Worker

## 2021-10-13 DIAGNOSIS — F411 Generalized anxiety disorder: Secondary | ICD-10-CM | POA: Diagnosis not present

## 2021-10-13 NOTE — Progress Notes (Signed)
THERAPIST PROGRESS NOTE  Session Time: 50 minutes  Participation Level: Active  Behavioral Response: CasualAlertAnxious and Depressed  Type of Therapy: Individual Therapy  Treatment Goals addressed: anxiety  ProgressTowards Goals: Progressing  Interventions: CBT, Strength-based, and Supportive  Summary: Patty Stanley is a 41 y.o. female who presents for f/u with this cln. She arrives on time and maintains good eye contact throughout. She reports things have been "chaotic" since last session. She missed her last scheduled appt due to her husband's health, as he has been having frequent nosebleeds and needs to see an ENT. Pt reports she has been having difficulty walking and is scheduled to see an MD to discuss blood work results tomorrow. She reports a significantly decreased appetite, stating "a baked potato will last me all day." She reports she primarily drinks soda, tea, and Propel and that her family is concerned that she will get diabetes. She states her appetite decreased after returning from the beach last weekend and was paranoid about eating around her mother because her mother frequently makes comments regarding pt's eating habits. She also reports that her husband's family told her when she first met them that she needed to gain wait. She is receptive to feedback from cln regarding body image and societal standards as well as the importance of boundary setting within relationships. Pt somewhat resistant, stating "Then the money might stop" in reference to her mother, but was ultimately receptive.  Suicidal/Homicidal: Nowithout intent/plan  Therapist Response: Cln assessed for current stressors, symptoms, and safety since last session. Cln utilized active listening and validation to assist with processing. Cln advised pt to see RD/nutritionist or similar specialist to attend to her dietary needs and address her decreased appetite rather than only following family advice. Cln  attempted to reduce shame regarding body image/eating habits by normalizing weight fluctuations and difficulty losing weight. Cln reiterated recommendation for her to see specialist as she is likely malnourished based on her reported eating/drinking habits. Cln focused on pt's report of only drinking sweet beverages and expressed concern for dehydration, as pt reports she does not drink plain water. Cln also encouraged pt to tell family members when she feels uncomfortable or hurt when they make comments about her eating or her body, stating that shame does not help people change habits, but feel worse while they engage in those habits. Cln discussed how BMI alone does not account for all other components of weight and health reframed healthy eating as trying to help her body rather than trying to lose weight. Cln scheduled f/u appointments and confirmed pt's preferred method of service delivery (in person). Pt does not yet know her availability due to her husband's appointments and consented for cln to schedule f/u appointments based on cln's availability.   Plan: Return again in 8 weeks (next available appt).  Diagnosis: GAD (generalized anxiety disorder)  Collaboration of Care: Primary Care Provider AEB recommended pt seek nutrition specialist to check labs and advise of needed dietary changes.  Patient/Guardian was advised Release of Information must be obtained prior to any record release in order to collaborate their care with an outside provider. Patient/Guardian was advised if they have not already done so to contact the registration department to sign all necessary forms in order for [place] to release information regarding their care.   Consent: Patient/Guardian gives verbal consent for treatment and assignment of benefits for services provided during this visit. Patient/Guardian expressed understanding and agreed to proceed.   Heron Nay, LCSWA 10/13/2021

## 2021-10-15 ENCOUNTER — Telehealth (HOSPITAL_COMMUNITY): Payer: Self-pay | Admitting: *Deleted

## 2021-10-15 NOTE — Telephone Encounter (Signed)
Call from [place] stating her attorney for her disability case asked her to call to get a list of all her meds and start dates. Explained medical records handles these type of requests but would think attorney would have asked for her records and that would be in them. There is not staff here to provide a hand done list and not the process for disability attorney. Will forward concern to provider to consider, did not commit to providing it.

## 2021-10-21 ENCOUNTER — Encounter (HOSPITAL_COMMUNITY): Payer: Self-pay | Admitting: Physician Assistant

## 2021-10-21 ENCOUNTER — Ambulatory Visit (INDEPENDENT_AMBULATORY_CARE_PROVIDER_SITE_OTHER): Payer: No Payment, Other | Admitting: Physician Assistant

## 2021-10-21 DIAGNOSIS — R41840 Attention and concentration deficit: Secondary | ICD-10-CM | POA: Diagnosis not present

## 2021-10-21 DIAGNOSIS — F418 Other specified anxiety disorders: Secondary | ICD-10-CM

## 2021-10-24 ENCOUNTER — Encounter (HOSPITAL_COMMUNITY): Payer: Self-pay | Admitting: Physician Assistant

## 2021-10-24 MED ORDER — SERTRALINE HCL 100 MG PO TABS
ORAL_TABLET | Freq: Every day | ORAL | 2 refills | Status: DC
  Filled 2021-10-24: qty 30, 30d supply, fill #0
  Filled 2021-12-01: qty 30, 30d supply, fill #1
  Filled 2022-01-05: qty 30, 30d supply, fill #2

## 2021-10-24 MED ORDER — QUETIAPINE FUMARATE 50 MG PO TABS
50.0000 mg | ORAL_TABLET | Freq: Every day | ORAL | 2 refills | Status: DC
  Filled 2021-10-24: qty 30, 30d supply, fill #0
  Filled 2021-12-01: qty 30, 30d supply, fill #1

## 2021-10-24 MED ORDER — ATOMOXETINE HCL 25 MG PO CAPS
25.0000 mg | ORAL_CAPSULE | Freq: Every day | ORAL | 2 refills | Status: DC
  Filled 2021-10-24: qty 30, 30d supply, fill #0
  Filled 2021-12-01: qty 30, 30d supply, fill #1
  Filled 2022-01-05: qty 30, 30d supply, fill #2

## 2021-10-24 MED ORDER — BUSPIRONE HCL 7.5 MG PO TABS
7.5000 mg | ORAL_TABLET | Freq: Two times a day (BID) | ORAL | 2 refills | Status: DC
  Filled 2021-10-24: qty 60, 30d supply, fill #0
  Filled 2021-12-01: qty 60, 30d supply, fill #1
  Filled 2022-01-05: qty 60, 30d supply, fill #2

## 2021-10-24 NOTE — Progress Notes (Addendum)
BH MD/PA/NP OP Progress Note  10/24/2021 10:27 PM Patty Stanley  MRN:  [id number]  Chief Complaint:  Chief Complaint  Patient presents with   Follow-up   HPI:   Patty Stanley is a 41 year old female with a past psychiatric history significant for depression with anxiety and attention/concentration deficit who presents to [facility] for follow-up and medication management.  Patient is currently being managed on the following medications:  Atomoxetine (Strattera) 25 mg daily Sertraline (Zoloft) 100 mg daily Seroquel 50 mg at bedtime Buspirone 7.5 mg 2 times daily  Patient denies any issues with her medications.  Patient denies experiencing any depressive episodes.  In regards to anxiety, patient states that her legs feel like Jell-O when anxious.  Patient states that she feels off balance and that she has noticed that her head is lowered down more.  Patient states that the symptoms have been going on for 3 months patient reports that she also was also not been able to eat very much.  She endorses dizziness upon walking.  She also finds it hard to go up flights of stairs.  [place] was [place] the beach, patient states that she found it difficult to walk 8 to 10 miles.  Patient continues to endorse stressors related to her husband's physical health as well as her husband's mother moving in with the family.  A PHQ-9 screen was performed with the patient scoring a 2.  A GAD-7 screen was also performed with the patient scoring a 10.  Patient is alert and oriented x4, calm, cooperative, and fully engaged in conversation during the encounter.  Patient endorses okay mood.  Patient denies suicidal or homicidal ideations.  She further denies auditory or visual hallucinations and does not appear to be responding to internal/external stimuli.  Patient endorses good sleep and receives on average 8 hours of sleep each night.  Patient endorses decreased appetite and  eats on average 1 meal per day.  Patient denies alcohol consumption stating that she has not drinking alcohol in the last 15 years.  Patient denies tobacco use and illicit drug use.  Visit Diagnosis:    ICD-10-CM   1. Depression with anxiety  F41.8 busPIRone (BUSPAR) 7.5 MG tablet    QUEtiapine (SEROQUEL) 50 MG tablet    sertraline (ZOLOFT) 100 MG tablet    2. Attention and concentration deficit  R41.840 atomoxetine (STRATTERA) 25 MG capsule      Past Psychiatric History:  Depression Anxiety  Past Medical History:  Past Medical History:  Diagnosis Date   Alcohol abuse    ALLERGIC RHINITIS    Anxiety    ASTHMA    DEPRESSION    Headache(784.0)    OSTEOARTHRITIS    UNSPECIFIED TACHYCARDIA     Past Surgical History:  Procedure Laterality Date   WISDOM TOOTH EXTRACTION      Family Psychiatric History:  Patient is adopted  Family History:  Family History  Adopted: Yes  Problem Relation Age of Onset   Alcohol abuse Other    Arthritis Other     Social History:  Social History   Socioeconomic History   Marital status: Married    Spouse name: Not on file   Number of children: Not on file   Years of education: Not on file   Highest education level: Not on file  Occupational History   Occupation: [employer]    Employer: [employer]  Tobacco Use   Smoking status: Never   Smokeless tobacco: Never  Substance and Sexual Activity   Alcohol use: No   Drug use: No   Sexual activity: Yes    Birth control/protection: Pill  Other Topics Concern   Not on file  Social History Narrative   Not on file   Social Determinants of Health   Financial Resource Strain: Not on file  Food Insecurity: Not on file  Transportation Needs: Not on file  Physical Activity: Not on file  Stress: Not on file  Social Connections: Not on file    Allergies:  Allergies  Allergen Reactions   Penicillins Hives    REACTION: Itching    Metabolic Disorder Labs: Lab Results   Component Value Date   HGBA1C 5.7 11/29/2012   No results found for: "PROLACTIN" Lab Results  Component Value Date   CHOL 167 11/29/2012   TRIG 113.0 11/29/2012   HDL 49.20 11/29/2012   CHOLHDL 3 11/29/2012   VLDL 22.6 11/29/2012   [place] 95 11/29/2012   Lab Results  Component Value Date   TSH 1.12 11/29/2012   TSH 2.23 12/10/2010    Therapeutic Level Labs: No results found for: "LITHIUM" No results found for: "VALPROATE" No results found for: "CBMZ"  Current Medications: Current Outpatient Medications  Medication Sig Dispense Refill   albuterol (PROVENTIL HFA) 108 (90 BASE) MCG/ACT inhaler Inhale 2 puffs into the lungs every 6 (six) hours as needed for wheezing. 3 Inhaler 3   albuterol (VENTOLIN HFA) 108 (90 Base) MCG/ACT inhaler Inhale 1 puff into the lungs every 6 hours as needed. 8.5 g 0   albuterol (VENTOLIN HFA) 108 (90 Base) MCG/ACT inhaler Inhale 1 puff by mouth into the lungs every 8 hours as needed 8.5 g 0   azithromycin (ZITHROMAX) 250 MG tablet Take 2 tablets by mouth today then take 1 tablet by mouth daily for 4 days 18 tablet 0   budesonide-formoterol (SYMBICORT) 160-4.5 MCG/ACT inhaler Inhale 1 puff into the lungs twice daily as needed. 10.2 g 0   cetirizine (ZYRTEC) 10 MG tablet Take 10 mg by mouth daily.     chlorhexidine (PERIDEX) 0.12 % solution Use 32m to gargle and spit twice daily. 473 mL 11   desloratadine (CLARINEX) 5 MG tablet Take 1 tablet (5 mg total) by mouth daily. 90 tablet 3   doxycycline (VIBRAMYCIN) 100 MG capsule Take 1 capsule by mouth every 12 hours for 10 days 20 capsule 0   ergocalciferol (VITAMIN D2) 1.25 MG (50000 UT) capsule Take 1 capsule (1.25 mg) by mouth weekly 12 capsule 0   hydrOXYzine (ATARAX) 25 MG tablet TAKE 1 TABLET BY MOUTH 3 TIMES DAILY AS NEEDED FOR ANXIETY. 90 tablet 0   levocetirizine (XYZAL) 5 MG tablet TAKE ONE TABLET BY MOUTH DAILY IN THE EVENING 90 tablet 3   montelukast (SINGULAIR) 10 MG tablet Take 1 tablet (10  mg total) by mouth daily. 90 tablet 3   montelukast (SINGULAIR) 10 MG tablet Take 1 tablet by mouth daily 90 tablet 0   nebivolol (BYSTOLIC) 5 MG tablet Take 1 tablet (5 mg) by mouth daily 90 tablet 0   norgestimate-ethinyl estradiol (VYLIBRA) 0.25-35 MG-MCG tablet Take 1 tablet by mouth once daily as directed-ok continuous dosing 84 tablet 0   omeprazole (PRILOSEC) 20 MG capsule TAKE 1 CAPSULE BY MOUTH ONCE DAILY 90 capsule 0   omeprazole (PRILOSEC) 40 MG capsule Take 40 mg by mouth daily.     predniSONE (DELTASONE) 10 MG tablet Take 1 tablet by mouth daily 10 tablet 0   atomoxetine (  STRATTERA) 25 MG capsule Take 1 capsule by mouth daily. 30 capsule 2   beclomethasone (QVAR) 80 MCG/ACT inhaler Inhale 1 puff into the lungs 2 (two) times daily. (Patient taking differently: Inhale 1 puff into the lungs 2 (two) times daily as needed (asthma). ) 3 Inhaler 3   busPIRone (BUSPAR) 7.5 MG tablet Take 1 tablet (7.5 mg total) by mouth 2 (two) times daily. 60 tablet 2   mometasone (NASONEX) 50 MCG/ACT nasal spray Place 24 sprays into the nose daily. (Patient not taking: Reported on 08/20/2014) 51 g 3   montelukast (SINGULAIR) 10 MG tablet TAKE 1 TABLET BY MOUTH ONCE DAILY 90 tablet 0   nebivolol (BYSTOLIC) 5 MG tablet TAKE 1 TABLET BY MOUTH ONCE A DAY 90 tablet 0   nebivolol (BYSTOLIC) 5 MG tablet TAKE 1 TABLET BY MOUTH DAILY 90 tablet 0   norgestimate-ethinyl estradiol (ORTHO-CYCLEN) 0.25-35 MG-MCG tablet TAKE 1 TABLET BY MOUTH ONCE DAILY CONTINUOUSLY AS DIRECTED 84 tablet 0   omeprazole (PRILOSEC) 20 MG capsule TAKE 1 CAPSULE BY MOUTH ONCE DAILY 90 capsule 0   QUEtiapine (SEROQUEL) 50 MG tablet Take 1 tablet (50 mg total) by mouth at bedtime. 30 tablet 2   sertraline (ZOLOFT) 100 MG tablet TAKE 1 TABLET BY MOUTH DAILY. 30 tablet 2   No current facility-administered medications for this visit.     Musculoskeletal: Strength & Muscle Tone: Unable to assess due to telemedicine visit [place]: Unable  to assess due to telemedicine visit Patient leans: Unable to assess due to telemedicine visit  Psychiatric Specialty Exam: Review of Systems  Psychiatric/Behavioral:  Negative for decreased concentration, dysphoric mood, hallucinations, self-injury, sleep disturbance and suicidal ideas. The patient is nervous/anxious. The patient is not hyperactive.     Blood pressure 112/84, pulse 98, height '4\' 11"'$  (1.499 m), weight 192 lb (87.1 kg).Body mass index is 38.78 kg/m.  General Appearance: Unable to assess due to telemedicine visit  Eye Contact:  Unable to assess due to telemedicine visit  Speech:  Clear and Coherent and Normal Rate  Volume:  Normal  Mood:  Anxious and Euthymic  Affect:  Appropriate and Congruent  Thought Process:  Coherent and Descriptions of Associations: Intact  Orientation:  Full (Time, Place, and Person)  Thought Content: WDL   Suicidal Thoughts:  No  Homicidal Thoughts:  No  Memory:  Immediate;   Good Recent;   Good Remote;   Good  Judgement:  Fair  Insight:  Fair  Psychomotor Activity:  Normal  Concentration:  Concentration: Good and Attention Span: Good  Recall:  Good  Fund of Knowledge: Good  Language: Good  Akathisia:  No  Handed:  Right  AIMS (if indicated): not done  Assets:  Communication Skills Desire for Improvement Financial Resources/Insurance Housing Social Support  ADL's:  Intact  Cognition: Impaired,  Mild  Sleep:  Good   Screenings: GAD-7    Flowsheet Row Office Visit from 10/21/2021 in [facility] Video Visit from 08/19/2021 in [facility] Video Visit from 06/17/2021 in [facility] Video Visit from 04/19/2021 in [place] from 02/12/2021 in [facility]  Total GAD-7 Score '10 16 11 18 18      '$ PHQ2-9    [place] Office Visit from 10/21/2021 in [facility] Video Visit from 08/19/2021 in [facility] Video Visit from 06/17/2021 in [facility]  Video Visit from 04/19/2021 in [place] from 02/12/2021 in [facility]  PHQ-2 Total Score '2 3 1 1 '$ 0  PHQ-9 Total Score 2 12 -- -- --      Flowsheet Row Video Visit from 08/19/2021 in [facility] Video Visit from 06/17/2021 in [facility] Video Visit from 04/19/2021 in [place] CATEGORY Low Risk Low Risk Low Risk        Assessment and Plan:   Patty Stanley is a 41 year old female with a past psychiatric history significant for depression with anxiety and attention/concentration deficit who presents to [facility] via virtual telephone visit for follow-up and medication management.  Patient reports no issues or concerns regarding her current medication regimen.  Patient denies depression and states that her anxiety has been manageable.  Patient has noticed that her legs feel like Jell-O and that she also feels off balanced.  Patient reports that the symptoms have been going on for 3 months and is unsure of the cause.  Patient would like to continue taking her medications as prescribed.  Patient's medications to be prescribed to pharmacy of choice.  Collaboration of Care: Collaboration of Care: Medication Management AEB provider managing patient's psychiatric medications, Psychiatrist AEB patient being followed by mental health provider, and Referral or follow-up with counselor/therapist AEB patient being seen by a licensed clinical social worker at this facility  Patient/Guardian was advised Release of Information must be obtained prior to any record release in order to collaborate their care with an outside provider. Patient/Guardian was  advised if they have not already done so to contact the registration department to sign all necessary forms in order for [place] to release information regarding their care.   Consent: Patient/Guardian gives verbal consent for treatment and assignment of benefits for services provided during this visit. Patient/Guardian expressed understanding and agreed to proceed.   1. Depression with anxiety  - busPIRone (BUSPAR) 7.5 MG tablet; Take 1 tablet (7.5 mg total) by mouth 2 (two) times daily.  Dispense: 60 tablet; Refill: 2 - QUEtiapine (SEROQUEL) 50 MG tablet; Take 1 tablet (50 mg total) by mouth at bedtime.  Dispense: 30 tablet; Refill: 2 - sertraline (ZOLOFT) 100 MG tablet; TAKE 1 TABLET BY MOUTH DAILY.  Dispense: 30 tablet; Refill: 2  2. Attention and concentration deficit  - atomoxetine (STRATTERA) 25 MG capsule; Take 1 capsule by mouth daily.  Dispense: 30 capsule; Refill: 2  Patient to follow in 2 months Providers spent a total of 16 minutes with the patient/reviewing patient's chart  [name], PA 10/24/2021, 10:27 PM

## 2021-10-25 ENCOUNTER — Other Ambulatory Visit (HOSPITAL_COMMUNITY): Payer: Self-pay

## 2021-11-01 ENCOUNTER — Other Ambulatory Visit (HOSPITAL_COMMUNITY): Payer: Self-pay

## 2021-11-01 MED ORDER — HYDROXYZINE HCL 25 MG PO TABS
ORAL_TABLET | Freq: Three times a day (TID) | ORAL | 0 refills | Status: DC | PRN
  Filled 2021-11-01: qty 90, 30d supply, fill #0

## 2021-11-03 ENCOUNTER — Other Ambulatory Visit (HOSPITAL_COMMUNITY): Payer: Self-pay

## 2021-11-03 MED ORDER — OLANZAPINE 2.5 MG PO TABS
2.5000 mg | ORAL_TABLET | Freq: Every day | ORAL | 0 refills | Status: DC
  Filled 2021-11-03: qty 30, 30d supply, fill #0

## 2021-11-04 ENCOUNTER — Encounter: Payer: Self-pay | Admitting: Physician Assistant

## 2021-11-04 ENCOUNTER — Other Ambulatory Visit (HOSPITAL_COMMUNITY): Payer: Self-pay

## 2021-11-17 ENCOUNTER — Other Ambulatory Visit (HOSPITAL_COMMUNITY): Payer: Self-pay

## 2021-12-01 ENCOUNTER — Ambulatory Visit: Payer: Medicaid Other | Admitting: Physician Assistant

## 2021-12-01 ENCOUNTER — Other Ambulatory Visit (HOSPITAL_COMMUNITY): Payer: Self-pay

## 2021-12-01 MED ORDER — OMEPRAZOLE 20 MG PO CPDR
20.0000 mg | DELAYED_RELEASE_CAPSULE | Freq: Every day | ORAL | 0 refills | Status: DC
  Filled 2021-12-01: qty 90, 90d supply, fill #0

## 2021-12-01 MED ORDER — OLANZAPINE 2.5 MG PO TABS
2.5000 mg | ORAL_TABLET | Freq: Every day | ORAL | 0 refills | Status: DC
  Filled 2021-12-01: qty 30, 30d supply, fill #0

## 2021-12-02 ENCOUNTER — Other Ambulatory Visit (HOSPITAL_COMMUNITY): Payer: Self-pay

## 2021-12-03 ENCOUNTER — Other Ambulatory Visit (HOSPITAL_COMMUNITY): Payer: Self-pay

## 2021-12-03 MED ORDER — HYDROXYZINE HCL 25 MG PO TABS
ORAL_TABLET | Freq: Three times a day (TID) | ORAL | 0 refills | Status: DC | PRN
  Filled 2021-12-03: qty 90, 30d supply, fill #0

## 2021-12-04 ENCOUNTER — Other Ambulatory Visit (HOSPITAL_COMMUNITY): Payer: Self-pay

## 2021-12-08 ENCOUNTER — Ambulatory Visit (HOSPITAL_COMMUNITY): Payer: No Payment, Other | Admitting: Licensed Clinical Social Worker

## 2021-12-22 ENCOUNTER — Ambulatory Visit (HOSPITAL_COMMUNITY): Payer: Self-pay | Admitting: Licensed Clinical Social Worker

## 2021-12-22 ENCOUNTER — Ambulatory Visit (HOSPITAL_COMMUNITY): Payer: No Payment, Other | Admitting: Physician Assistant

## 2021-12-22 ENCOUNTER — Telehealth (HOSPITAL_COMMUNITY): Payer: Self-pay | Admitting: Licensed Clinical Social Worker

## 2021-12-22 NOTE — Telephone Encounter (Signed)
See call intake 

## 2021-12-23 ENCOUNTER — Other Ambulatory Visit (HOSPITAL_COMMUNITY): Payer: Self-pay

## 2021-12-23 MED ORDER — OLANZAPINE 5 MG PO TABS
ORAL_TABLET | ORAL | 0 refills | Status: DC
  Filled 2021-12-23: qty 30, 30d supply, fill #0

## 2022-01-05 ENCOUNTER — Ambulatory Visit (INDEPENDENT_AMBULATORY_CARE_PROVIDER_SITE_OTHER): Payer: No Payment, Other | Admitting: Licensed Clinical Social Worker

## 2022-01-05 ENCOUNTER — Other Ambulatory Visit (HOSPITAL_COMMUNITY): Payer: Self-pay

## 2022-01-05 DIAGNOSIS — F411 Generalized anxiety disorder: Secondary | ICD-10-CM

## 2022-01-05 MED ORDER — HYDROXYZINE HCL 25 MG PO TABS
ORAL_TABLET | Freq: Three times a day (TID) | ORAL | 0 refills | Status: DC | PRN
  Filled 2022-01-05: qty 90, fill #0
  Filled 2022-01-06: qty 90, 30d supply, fill #0

## 2022-01-05 NOTE — Progress Notes (Signed)
   THERAPIST PROGRESS NOTE  Session Time: 54 minutes  Participation Level: {BHH PARTICIPATION LEVEL:22264}  Behavioral Response: {Appearance:22683}{BHH LEVEL OF CONSCIOUSNESS:22305}{BHH MOOD:22306}  Type of Therapy: {CHL AMB BH Type of Therapy:21022741}  Treatment Goals addressed: ***  ProgressTowards Goals: {Progress Towards Goals:21014066}  Interventions: {CHL AMB BH Type of Intervention:21022753}  Summary: Patty Stanley is a 41 y.o. female who presents with ***.   Suicidal/Homicidal: {BHH YES OR NO:22294}{yes/no/with/without intent/plan:22693}  Therapist Response: ***  Plan: Return again in *** weeks.  Diagnosis: GAD (generalized anxiety disorder)  Collaboration of Care: {BH OP Collaboration of Care:21014065}  Patient/Guardian was advised Release of Information must be obtained prior to any record release in order to collaborate their care with an outside provider. Patient/Guardian was advised if they have not already done so to contact the registration department to sign all necessary forms in order for [place] to release information regarding their care.   Consent: Patient/Guardian gives verbal consent for treatment and assignment of benefits for services provided during this visit. Patient/Guardian expressed understanding and agreed to proceed.   Heron Nay, LCSWA 01/05/2022

## 2022-01-06 ENCOUNTER — Other Ambulatory Visit (HOSPITAL_COMMUNITY): Payer: Self-pay

## 2022-01-10 ENCOUNTER — Other Ambulatory Visit (HOSPITAL_COMMUNITY): Payer: Self-pay

## 2022-01-10 DIAGNOSIS — R1310 Dysphagia, unspecified: Secondary | ICD-10-CM

## 2022-01-13 ENCOUNTER — Telehealth (HOSPITAL_COMMUNITY): Payer: Self-pay | Admitting: *Deleted

## 2022-01-13 NOTE — Telephone Encounter (Signed)
[facility] [phone] the provider is reaching out  To speak to you about this patient -- last seen 10/21/21 ---Contact person [place]

## 2022-01-19 ENCOUNTER — Other Ambulatory Visit (HOSPITAL_COMMUNITY): Payer: Self-pay

## 2022-01-19 ENCOUNTER — Ambulatory Visit (INDEPENDENT_AMBULATORY_CARE_PROVIDER_SITE_OTHER): Payer: No Payment, Other | Admitting: Physician Assistant

## 2022-01-19 ENCOUNTER — Encounter (HOSPITAL_COMMUNITY): Payer: Self-pay | Admitting: Physician Assistant

## 2022-01-19 DIAGNOSIS — F418 Other specified anxiety disorders: Secondary | ICD-10-CM | POA: Diagnosis not present

## 2022-01-19 MED ORDER — SERTRALINE HCL 100 MG PO TABS
ORAL_TABLET | Freq: Every day | ORAL | 2 refills | Status: DC
  Filled 2022-01-19: qty 30, fill #0
  Filled 2022-02-03: qty 30, 30d supply, fill #0
  Filled 2022-03-07: qty 30, 30d supply, fill #1

## 2022-01-19 MED ORDER — BUSPIRONE HCL 7.5 MG PO TABS
7.5000 mg | ORAL_TABLET | Freq: Two times a day (BID) | ORAL | 2 refills | Status: DC
  Filled 2022-01-19: qty 60, 30d supply, fill #0

## 2022-01-19 NOTE — Progress Notes (Signed)
BH MD/PA/NP OP Progress Note  01/19/2022 8:30 PM Patty Stanley  MRN:  [id number]  Chief Complaint:  Chief Complaint  Patient presents with   Medication Refill   Follow-up   Medication Management   HPI:   Patty Stanley is a 41 year old female with a past psychiatric history significant for depression with anxiety and attention/concentration deficit who presents to [facility], accompanied by her mother, for follow-up and medication management.  Patient is currently being managed on the following medications:  Atomoxetine (Strattera) 25 mg daily Sertraline (Zoloft) 100 mg daily Seroquel 50 mg at bedtime Buspirone 7.5 mg 2 times daily  Per patient's mother, patient family practice doctor stated that the patient's medications need to be looked at due to patient's weight loss.  Patient states that she takes her medications daily, but denies eating much.  Patient has lost significant weight with her mother stating that the patient started losing noticeable weight back in May.  Family practice doctor believes that the reason for her significant weight loss is due to her severe depression and caretakers fatigue due to the patient taking care of her husband.  Patient is unable to physically swallow with her mother stating that an endoscopy was performed with no significant results uncovered.  States that a barium swallow test will be performed in the coming months.  Patient states that whenever she is eating anything she feels like she will throw it up.  Patient states that she often experiences gagging when eating.  She states that the only thing that she has been able to ingest have been cantaloupe, broth, and protein shakes.  Patient endorses depression roughly 2 times a week.  Patient endorses the following depressive symptoms: hopelessness, difficulty concentrating, and irritability.  Patient denies feelings of guilt/worthlessness but states that she  feels hopeless because she cannot help her husband anymore due to her fatigue.  Patient endorses anxiety and rates her anxiety at 4 out of 10 but denies any new stressors.  The most that the patient has weighed in the past has been 180 pounds and now the patient weighs less than 112 pounds.  A PHQ-9 screen was performed with the patient scoring a 2.  A GAD-7 screen was also performed with the patient scoring a 10.  Patient is alert and oriented x4, calm, cooperative, and fully engaged in conversation during the encounter.  Patient endorses okay mood.  Patient denies suicidal or homicidal ideations.  She further denies auditory or visual hallucinations and does not appear to be responding to internal/external stimuli.  Patient endorses very sleep stating that she stays up most of the night.  Patient endorses decreased appetite and eats on average 1 meal per day.  Patient denies alcohol consumption, tobacco use, and illicit drug use.  Visit Diagnosis:    ICD-10-CM   1. Depression with anxiety  F41.8 busPIRone (BUSPAR) 7.5 MG tablet    sertraline (ZOLOFT) 100 MG tablet      Past Psychiatric History:  Depression Anxiety  Past Medical History:  Past Medical History:  Diagnosis Date   Alcohol abuse    ALLERGIC RHINITIS    Anxiety    ASTHMA    DEPRESSION    Headache(784.0)    OSTEOARTHRITIS    UNSPECIFIED TACHYCARDIA     Past Surgical History:  Procedure Laterality Date   WISDOM TOOTH EXTRACTION      Family Psychiatric History:  Patient is adopted  Family History:  Family History  Adopted: Yes  Problem  Relation Age of Onset   Alcohol abuse Other    Arthritis Other     Social History:  Social History   Socioeconomic History   Marital status: Married    Spouse name: Not on file   Number of children: Not on file   Years of education: Not on file   Highest education level: Not on file  Occupational History   Occupation: [employer]    Employer: [employer]  Tobacco Use    Smoking status: Never   Smokeless tobacco: Never  Substance and Sexual Activity   Alcohol use: No   Drug use: No   Sexual activity: Yes    Birth control/protection: Pill  Other Topics Concern   Not on file  Social History Narrative   Not on file   Social Determinants of Health   Financial Resource Strain: Not on file  Food Insecurity: Not on file  Transportation Needs: Not on file  Physical Activity: Not on file  Stress: Not on file  Social Connections: Not on file    Allergies:  Allergies  Allergen Reactions   Penicillins Hives    REACTION: Itching    Metabolic Disorder Labs: Lab Results  Component Value Date   HGBA1C 5.7 11/29/2012   No results found for: "PROLACTIN" Lab Results  Component Value Date   CHOL 167 11/29/2012   TRIG 113.0 11/29/2012   HDL 49.20 11/29/2012   CHOLHDL 3 11/29/2012   VLDL 22.6 11/29/2012   [facility] 95 11/29/2012   Lab Results  Component Value Date   TSH 1.12 11/29/2012   TSH 2.23 12/10/2010    Therapeutic Level Labs: No results found for: "LITHIUM" No results found for: "VALPROATE" No results found for: "CBMZ"  Current Medications: Current Outpatient Medications  Medication Sig Dispense Refill   albuterol (PROVENTIL HFA) 108 (90 BASE) MCG/ACT inhaler Inhale 2 puffs into the lungs every 6 (six) hours as needed for wheezing. 3 Inhaler 3   albuterol (VENTOLIN HFA) 108 (90 Base) MCG/ACT inhaler Inhale 1 puff into the lungs every 6 hours as needed. 8.5 g 0   albuterol (VENTOLIN HFA) 108 (90 Base) MCG/ACT inhaler Inhale 1 puff by mouth into the lungs every 8 hours as needed 8.5 g 0   atomoxetine (STRATTERA) 25 MG capsule Take 1 capsule by mouth daily. 30 capsule 2   azithromycin (ZITHROMAX) 250 MG tablet Take 2 tablets by mouth today then take 1 tablet by mouth daily for 4 days 18 tablet 0   beclomethasone (QVAR) 80 MCG/ACT inhaler Inhale 1 puff into the lungs 2 (two) times daily. (Patient taking differently: Inhale 1 puff into  the lungs 2 (two) times daily as needed (asthma). ) 3 Inhaler 3   budesonide-formoterol (SYMBICORT) 160-4.5 MCG/ACT inhaler Inhale 1 puff into the lungs twice daily as needed. 10.2 g 0   busPIRone (BUSPAR) 7.5 MG tablet Take 1 tablet (7.5 mg total) by mouth 2 (two) times daily. 60 tablet 2   cetirizine (ZYRTEC) 10 MG tablet Take 10 mg by mouth daily.     chlorhexidine (PERIDEX) 0.12 % solution Use 11m to gargle and spit twice daily. 473 mL 11   desloratadine (CLARINEX) 5 MG tablet Take 1 tablet (5 mg total) by mouth daily. 90 tablet 3   doxycycline (VIBRAMYCIN) 100 MG capsule Take 1 capsule by mouth every 12 hours for 10 days 20 capsule 0   ergocalciferol (VITAMIN D2) 1.25 MG (50000 UT) capsule Take 1 capsule (1.25 mg) by mouth weekly 12 capsule 0  hydrOXYzine (ATARAX) 25 MG tablet TAKE 1 TABLET BY MOUTH 3 TIMES DAILY AS NEEDED FOR ANXIETY. 90 tablet 0   levocetirizine (XYZAL) 5 MG tablet TAKE ONE TABLET BY MOUTH DAILY IN THE EVENING 90 tablet 3   mometasone (NASONEX) 50 MCG/ACT nasal spray Place 24 sprays into the nose daily. (Patient not taking: Reported on 08/20/2014) 51 g 3   montelukast (SINGULAIR) 10 MG tablet Take 1 tablet (10 mg total) by mouth daily. 90 tablet 3   montelukast (SINGULAIR) 10 MG tablet TAKE 1 TABLET BY MOUTH ONCE DAILY 90 tablet 0   montelukast (SINGULAIR) 10 MG tablet Take 1 tablet by mouth daily 90 tablet 0   nebivolol (BYSTOLIC) 5 MG tablet TAKE 1 TABLET BY MOUTH ONCE A DAY 90 tablet 0   nebivolol (BYSTOLIC) 5 MG tablet TAKE 1 TABLET BY MOUTH DAILY 90 tablet 0   nebivolol (BYSTOLIC) 5 MG tablet Take 1 tablet (5 mg) by mouth daily 90 tablet 0   norgestimate-ethinyl estradiol (ORTHO-CYCLEN) 0.25-35 MG-MCG tablet TAKE 1 TABLET BY MOUTH ONCE DAILY CONTINUOUSLY AS DIRECTED 84 tablet 0   norgestimate-ethinyl estradiol (VYLIBRA) 0.25-35 MG-MCG tablet Take 1 tablet by mouth once daily as directed-ok continuous dosing 84 tablet 0   OLANZapine (ZYPREXA) 5 MG tablet Take 1 tablet  (5 mg) by mouth daily 30 tablet 0   omeprazole (PRILOSEC) 20 MG capsule TAKE 1 CAPSULE BY MOUTH ONCE DAILY 90 capsule 0   omeprazole (PRILOSEC) 20 MG capsule TAKE 1 CAPSULE BY MOUTH ONCE DAILY 90 capsule 0   omeprazole (PRILOSEC) 40 MG capsule Take 40 mg by mouth daily.     predniSONE (DELTASONE) 10 MG tablet Take 1 tablet by mouth daily 10 tablet 0   QUEtiapine (SEROQUEL) 50 MG tablet Take 1 tablet (50 mg total) by mouth at bedtime. 30 tablet 2   sertraline (ZOLOFT) 100 MG tablet TAKE 1 TABLET BY MOUTH DAILY. 30 tablet 2   No current facility-administered medications for this visit.     Musculoskeletal: Strength & Muscle Tone: Unable to assess due to telemedicine visit [place]: Unable to assess due to telemedicine visit Patient leans: Unable to assess due to telemedicine visit  Psychiatric Specialty Exam: Review of Systems  Psychiatric/Behavioral:  Negative for decreased concentration, dysphoric mood, hallucinations, self-injury, sleep disturbance and suicidal ideas. The patient is nervous/anxious. The patient is not hyperactive.     Blood pressure 94/62, pulse 94, height '4\' 11"'$  (1.499 m), weight 103 lb 6.4 oz (46.9 kg), SpO2 100 %.Body mass index is 20.88 kg/m.  General Appearance: Unable to assess due to telemedicine visit  Eye Contact:  Unable to assess due to telemedicine visit  Speech:  Clear and Coherent and Normal Rate  Volume:  Normal  Mood:  Anxious and Euthymic  Affect:  Appropriate and Congruent  Thought Process:  Coherent and Descriptions of Associations: Intact  Orientation:  Full (Time, Place, and Person)  Thought Content: WDL   Suicidal Thoughts:  No  Homicidal Thoughts:  No  Memory:  Immediate;   Good Recent;   Good Remote;   Good  Judgement:  Fair  Insight:  Fair  Psychomotor Activity:  Normal  Concentration:  Concentration: Good and Attention Span: Good  Recall:  Good  Fund of Knowledge: Good  Language: Good  Akathisia:  No  Handed:  Right  AIMS  (if indicated): not done  Assets:  Communication Skills Desire for Improvement Financial Resources/Insurance Housing Social Support  ADL's:  Intact  Cognition: Impaired,  Mild  Sleep:  Good   Screenings: GAD-7    Flowsheet Row Office Visit from 01/19/2022 in [facility] Counselor from 01/05/2022 in [facility] Office Visit from 10/21/2021 in [facility] Video Visit from 08/19/2021 in [facility] Video Visit from 06/17/2021 in [facility]  Total GAD-7 Score '10 6 10 16 11      '$ PHQ2-9    [place] Office Visit from 01/19/2022 in [facility] Counselor from 01/05/2022 in [facility] Office Visit from 10/21/2021 in [facility] Video Visit from 08/19/2021 in [facility] Video Visit from 06/17/2021 in [place]  PHQ-2 Total Score 0 '2 2 3 1  '$ PHQ-9 Total Score '2 9 2 12 '$ --      [place] Office Visit from 01/19/2022 in [facility] Video Visit from 08/19/2021 in [facility] Video Visit from 06/17/2021 in [place] CATEGORY Low Risk Low Risk Low Risk        Assessment and Plan:   Patty Stanley is a 41 year old female with a past psychiatric history significant for depression with anxiety and attention/concentration deficit who presents to [facility], accompanied by her mother, for follow-up and medication management.  Patient has been losing significant weight over the past few months.  Per patient's mother, patient family practice doctor believes that her medications need to be reevaluated.  Patient endorses some depression and some anxiety.  Patient notes that she also has  difficulty swallowing whenever she engages in eating.  Provider to take patient off of Seroquel due to possible symptoms of dysphagia.  Patient was informed to take Seroquel 25 mg at bedtime for a week before discontinuing.  Patient was also recommended discontinuing Strattera due to possible symptoms of decreased appetite.  Patient was agreeable to recommendations.  Patient to continue taking both sertraline and buspirone.  Patient's medications to be e-prescribed to pharmacy of choice.  Collaboration of Care: Collaboration of Care: Medication Management AEB provider managing patient's psychiatric medications, Psychiatrist AEB patient being followed by mental health provider, and Referral or follow-up with counselor/therapist AEB patient being seen by a licensed clinical social worker at this facility  Patient/Guardian was advised Release of Information must be obtained prior to any record release in order to collaborate their care with an outside provider. Patient/Guardian was advised if they have not already done so to contact the registration department to sign all necessary forms in order for [place] to release information regarding their care.   Consent: Patient/Guardian gives verbal consent for treatment and assignment of benefits for services provided during this visit. Patient/Guardian expressed understanding and agreed to proceed.   1. Depression with anxiety  - busPIRone (BUSPAR) 7.5 MG tablet; Take 1 tablet (7.5 mg total) by mouth 2 (two) times daily.  Dispense: 60 tablet; Refill: 2 - sertraline (ZOLOFT) 100 MG tablet; TAKE 1 TABLET BY MOUTH DAILY.  Dispense: 30 tablet; Refill: 2   Patient to follow in 3 weeks Providers spent a total of 24 minutes with the patient/reviewing patient's chart  [name], PA 01/19/2022, 8:30 PM

## 2022-01-20 ENCOUNTER — Telehealth (HOSPITAL_COMMUNITY): Payer: Self-pay | Admitting: Licensed Clinical Social Worker

## 2022-01-20 NOTE — Telephone Encounter (Signed)
Cln called pt's mother to f/u, per her and pt's request at last session, and informed her that financial assistance available for pts without insurance or MCD consists of the [employer], which Bethena Roys states pt has and forgets to renew. Cln informed her that [facility] and [facility] are the neurologist options for pts using the [facility] and reiterated that due to pt's falls along with other symptoms, a neurology referral would be beneficial. Bethena Roys reports pt has lost an additional 10 lbs and pt's psychiatrist is tapering her off medication that can cause difficulty swallowing. Cln reiterated that pt would benefit from seeing a nutritionist due to pt's significant weight loss and poor appetite. Bethena Roys verbalized understanding.

## 2022-01-21 ENCOUNTER — Other Ambulatory Visit (HOSPITAL_COMMUNITY): Payer: Self-pay

## 2022-01-21 MED ORDER — CYPROHEPTADINE HCL 2 MG/5ML PO SYRP
ORAL_SOLUTION | ORAL | 0 refills | Status: DC
  Filled 2022-01-21: qty 473, 32d supply, fill #0

## 2022-01-22 ENCOUNTER — Other Ambulatory Visit (HOSPITAL_COMMUNITY): Payer: Self-pay

## 2022-01-22 MED ORDER — HYDROXYZINE HCL 25 MG PO TABS: ORAL_TABLET | ORAL | 0 refills | Status: DC

## 2022-01-24 ENCOUNTER — Other Ambulatory Visit (HOSPITAL_COMMUNITY): Payer: Self-pay

## 2022-01-25 ENCOUNTER — Ambulatory Visit (HOSPITAL_COMMUNITY): Payer: Medicaid Other

## 2022-01-25 ENCOUNTER — Encounter (HOSPITAL_COMMUNITY): Payer: Medicaid Other

## 2022-01-25 ENCOUNTER — Encounter (HOSPITAL_COMMUNITY): Payer: Self-pay

## 2022-01-26 ENCOUNTER — Other Ambulatory Visit (HOSPITAL_COMMUNITY): Payer: Self-pay

## 2022-01-26 MED ORDER — NEBIVOLOL HCL 5 MG PO TABS
ORAL_TABLET | ORAL | 0 refills | Status: DC
  Filled 2022-01-26: qty 90, fill #0

## 2022-01-26 MED ORDER — MONTELUKAST SODIUM 10 MG PO TABS
ORAL_TABLET | Freq: Every day | ORAL | 0 refills | Status: DC
  Filled 2022-01-26: qty 90, 90d supply, fill #0

## 2022-02-03 ENCOUNTER — Other Ambulatory Visit (HOSPITAL_COMMUNITY): Payer: Self-pay

## 2022-02-03 MED ORDER — OLANZAPINE 5 MG PO TABS
ORAL_TABLET | ORAL | 0 refills | Status: DC
  Filled 2022-02-03: qty 30, 30d supply, fill #0

## 2022-02-04 ENCOUNTER — Other Ambulatory Visit (HOSPITAL_COMMUNITY): Payer: Self-pay

## 2022-02-07 ENCOUNTER — Other Ambulatory Visit (HOSPITAL_COMMUNITY): Payer: Self-pay

## 2022-02-07 MED ORDER — NEBIVOLOL HCL 5 MG PO TABS
5.0000 mg | ORAL_TABLET | Freq: Every day | ORAL | 0 refills | Status: DC
  Filled 2022-02-07: qty 90, 90d supply, fill #0

## 2022-02-10 ENCOUNTER — Encounter (HOSPITAL_COMMUNITY): Payer: Self-pay | Admitting: Physician Assistant

## 2022-02-10 ENCOUNTER — Ambulatory Visit (INDEPENDENT_AMBULATORY_CARE_PROVIDER_SITE_OTHER): Payer: No Payment, Other | Admitting: Physician Assistant

## 2022-02-10 ENCOUNTER — Other Ambulatory Visit: Payer: Self-pay

## 2022-02-10 VITALS — BP 120/87 | HR 85 | Ht 59.0 in | Wt 106.0 lb

## 2022-02-10 DIAGNOSIS — F418 Other specified anxiety disorders: Secondary | ICD-10-CM

## 2022-02-10 DIAGNOSIS — G4700 Insomnia, unspecified: Secondary | ICD-10-CM | POA: Diagnosis not present

## 2022-02-10 MED ORDER — RAMELTEON 8 MG PO TABS
8.0000 mg | ORAL_TABLET | Freq: Every day | ORAL | 1 refills | Status: DC
  Filled 2022-02-10: qty 30, 30d supply, fill #0

## 2022-02-11 ENCOUNTER — Encounter (HOSPITAL_COMMUNITY): Payer: Self-pay | Admitting: Physician Assistant

## 2022-02-11 NOTE — Progress Notes (Unsigned)
BH MD/PA/NP OP Progress Note  02/11/2022 8:08 PM Patty Stanley  MRN:  [id number]  Chief Complaint:  Chief Complaint  Patient presents with   Medication Management   HPI:   Patty Stanley is a 41 year old female with a past psychiatric history significant for depression with anxiety and attention/concentration deficit who presents to [facility], accompanied by her mother, for follow-up and medication management.  Patient is currently being managed on the following medications:  Sertraline (Zoloft) 100 mg daily Buspirone 7.5 mg 2 times daily  During the last encounter, provider discontinued the patient off her Seroquel due to patient experiencing gagging when eating.  In addition to gagging while eating, patient was only able to eat cantaloupe, broth, and protein shakes.  Since discontinuing off her Seroquel, patient is much more animated and excited about things.  Patient has also been able to eat a variety of foods and has gained 6 pounds.  Patient reports no issues or concerns regarding her current medication regimen.  She notes that she has some issues with focus and concentration and states that she feels more fidgety.  Patient denies having issues with detail or following instructions but does report being more distracted.  Patient denies depressive symptoms and endorses some anxiety.  Patient rates her anxiety at a 2 or 3 out of 10 and denies any new stressors at this time.  A GAD-7 screen was performed with the patient scoring a 7.  Patient is alert and oriented x4, calm, cooperative, and fully engaged in conversation during the encounter.  Patient endorses okay mood.  Patient denies suicidal or homicidal ideations.  She further denies auditory or visual hallucinations and does not appear to be responding to internal/external stimuli.  Patient endorses varied sleep and is unable to quantify the amount of hours she receives in the night.   Patient endorses fair appetite and eats on average 2 meals per day.  Patient denies alcohol consumption, tobacco use, and illicit drug use.  Visit Diagnosis:    ICD-10-CM   1. Insomnia, unspecified type  G47.00 ramelteon (ROZEREM) 8 MG tablet    2. Depression with anxiety  F41.8       Past Psychiatric History:  Depression Anxiety  Past Medical History:  Past Medical History:  Diagnosis Date   Alcohol abuse    ALLERGIC RHINITIS    Anxiety    ASTHMA    DEPRESSION    Headache(784.0)    OSTEOARTHRITIS    UNSPECIFIED TACHYCARDIA     Past Surgical History:  Procedure Laterality Date   WISDOM TOOTH EXTRACTION      Family Psychiatric History:  Patient is adopted  Family History:  Family History  Adopted: Yes  Problem Relation Age of Onset   Alcohol abuse Other    Arthritis Other     Social History:  Social History   Socioeconomic History   Marital status: Married    Spouse name: Not on file   Number of children: Not on file   Years of education: Not on file   Highest education level: Not on file  Occupational History   Occupation: [employer]    Employer: [employer]  Tobacco Use   Smoking status: Never   Smokeless tobacco: Never  Substance and Sexual Activity   Alcohol use: No   Drug use: No   Sexual activity: Yes    Birth control/protection: Pill  Other Topics Concern   Not on file  Social History Narrative   Not  on file   Social Determinants of Health   Financial Resource Strain: Not on file  Food Insecurity: Not on file  Transportation Needs: Not on file  Physical Activity: Not on file  Stress: Not on file  Social Connections: Not on file    Allergies:  Allergies  Allergen Reactions   Penicillins Hives    REACTION: Itching    Metabolic Disorder Labs: Lab Results  Component Value Date   HGBA1C 5.7 11/29/2012   No results found for: "PROLACTIN" Lab Results  Component Value Date   CHOL 167 11/29/2012   TRIG 113.0 11/29/2012    HDL 49.20 11/29/2012   CHOLHDL 3 11/29/2012   VLDL 22.6 11/29/2012   [place] 95 11/29/2012   Lab Results  Component Value Date   TSH 1.12 11/29/2012   TSH 2.23 12/10/2010    Therapeutic Level Labs: No results found for: "LITHIUM" No results found for: "VALPROATE" No results found for: "CBMZ"  Current Medications: Current Outpatient Medications  Medication Sig Dispense Refill   ramelteon (ROZEREM) 8 MG tablet Take 1 tablet (8 mg total) by mouth at bedtime. 30 tablet 1   albuterol (PROVENTIL HFA) 108 (90 BASE) MCG/ACT inhaler Inhale 2 puffs into the lungs every 6 (six) hours as needed for wheezing. 3 Inhaler 3   albuterol (VENTOLIN HFA) 108 (90 Base) MCG/ACT inhaler Inhale 1 puff into the lungs every 6 hours as needed. 8.5 g 0   albuterol (VENTOLIN HFA) 108 (90 Base) MCG/ACT inhaler Inhale 1 puff by mouth into the lungs every 8 hours as needed 8.5 g 0   atomoxetine (STRATTERA) 25 MG capsule Take 1 capsule by mouth daily. 30 capsule 2   azithromycin (ZITHROMAX) 250 MG tablet Take 2 tablets by mouth today then take 1 tablet by mouth daily for 4 days 18 tablet 0   beclomethasone (QVAR) 80 MCG/ACT inhaler Inhale 1 puff into the lungs 2 (two) times daily. (Patient taking differently: Inhale 1 puff into the lungs 2 (two) times daily as needed (asthma). ) 3 Inhaler 3   budesonide-formoterol (SYMBICORT) 160-4.5 MCG/ACT inhaler Inhale 1 puff into the lungs twice daily as needed. 10.2 g 0   busPIRone (BUSPAR) 7.5 MG tablet Take 1 tablet (7.5 mg total) by mouth 2 (two) times daily. 60 tablet 2   cetirizine (ZYRTEC) 10 MG tablet Take 10 mg by mouth daily.     chlorhexidine (PERIDEX) 0.12 % solution Use 63m to gargle and spit twice daily. 473 mL 11   cyproheptadine (PERIACTIN) 2 MG/5ML syrup Take 5 ml (2 mg) by mouth every 8 hours as needed- patient to hold atarax while on this medication 473 mL 0   desloratadine (CLARINEX) 5 MG tablet Take 1 tablet (5 mg total) by mouth daily. 90 tablet 3    doxycycline (VIBRAMYCIN) 100 MG capsule Take 1 capsule by mouth every 12 hours for 10 days 20 capsule 0   ergocalciferol (VITAMIN D2) 1.25 MG (50000 UT) capsule Take 1 capsule (1.25 mg) by mouth weekly 12 capsule 0   hydrOXYzine (ATARAX) 25 MG tablet TAKE 1 TABLET BY MOUTH 3 TIMES DAILY AS NEEDED FOR ANXIETY. 90 tablet 0   hydrOXYzine (ATARAX) 25 MG tablet Take 1 tablet (25 mg) by mouth every 8 hours as needed 1 tablet 0   levocetirizine (XYZAL) 5 MG tablet TAKE ONE TABLET BY MOUTH DAILY IN THE EVENING 90 tablet 3   mometasone (NASONEX) 50 MCG/ACT nasal spray Place 24 sprays into the nose daily. (Patient not taking: Reported on  08/20/2014) 51 g 3   montelukast (SINGULAIR) 10 MG tablet Take 1 tablet (10 mg total) by mouth daily. 90 tablet 3   montelukast (SINGULAIR) 10 MG tablet TAKE 1 TABLET BY MOUTH ONCE DAILY 90 tablet 0   montelukast (SINGULAIR) 10 MG tablet Take 1 tablet by mouth daily 90 tablet 0   nebivolol (BYSTOLIC) 5 MG tablet TAKE 1 TABLET BY MOUTH ONCE A DAY 90 tablet 0   nebivolol (BYSTOLIC) 5 MG tablet TAKE 1 TABLET BY MOUTH DAILY 90 tablet 0   nebivolol (BYSTOLIC) 5 MG tablet Take 1 tablet (5 mg) by mouth daily 90 tablet 0   norgestimate-ethinyl estradiol (ORTHO-CYCLEN) 0.25-35 MG-MCG tablet TAKE 1 TABLET BY MOUTH ONCE DAILY CONTINUOUSLY AS DIRECTED 84 tablet 0   norgestimate-ethinyl estradiol (VYLIBRA) 0.25-35 MG-MCG tablet Take 1 tablet by mouth once daily as directed-ok continuous dosing 84 tablet 0   OLANZapine (ZYPREXA) 5 MG tablet Take 1 tablet (5 mg) by mouth daily 30 tablet 0   omeprazole (PRILOSEC) 20 MG capsule TAKE 1 CAPSULE BY MOUTH ONCE DAILY 90 capsule 0   omeprazole (PRILOSEC) 20 MG capsule TAKE 1 CAPSULE BY MOUTH ONCE DAILY 90 capsule 0   omeprazole (PRILOSEC) 40 MG capsule Take 40 mg by mouth daily.     predniSONE (DELTASONE) 10 MG tablet Take 1 tablet by mouth daily 10 tablet 0   QUEtiapine (SEROQUEL) 50 MG tablet Take 1 tablet (50 mg total) by mouth at bedtime. 30  tablet 2   sertraline (ZOLOFT) 100 MG tablet TAKE 1 TABLET BY MOUTH DAILY. 30 tablet 2   No current facility-administered medications for this visit.     Musculoskeletal: Strength & Muscle Tone: within normal limits Gait & Station: unsteady, patient utilizes a cane while ambulating Patient leans: patient utilizes a cane  Psychiatric Specialty Exam: Review of Systems  Psychiatric/Behavioral:  Positive for sleep disturbance. Negative for decreased concentration, dysphoric mood, hallucinations, self-injury and suicidal ideas. The patient is not nervous/anxious and is not hyperactive.     Blood pressure 120/87, pulse 85, height '4\' 11"'$  (1.499 m), weight 106 lb (48.1 kg), SpO2 100 %.Body mass index is 21.41 kg/m.  General Appearance: Casual  Eye Contact:  Good  Speech:  Clear and Coherent and Normal Rate  Volume:  Normal  Mood:  Euthymic  Affect:  Appropriate  Thought Process:  Coherent and Descriptions of Associations: Intact  Orientation:  Full (Time, Place, and Person)  Thought Content: WDL   Suicidal Thoughts:  No  Homicidal Thoughts:  No  Memory:  Immediate;   Good Recent;   Good Remote;   Good  Judgement:  Fair  Insight:  Fair  Psychomotor Activity:  Normal  Concentration:  Concentration: Good and Attention Span: Good  Recall:  Good  Fund of Knowledge: Good  Language: Good  Akathisia:  No  Handed:  Right  AIMS (if indicated): not done  Assets:  Communication Skills Desire for Improvement Financial Resources/Insurance Housing Social Support  ADL's:  Intact  Cognition: Impaired,  Mild  Sleep:  Fair   Screenings: GAD-7    Flowsheet Row Clinical Support from 02/10/2022 in [facility] Office Visit from 01/19/2022 in [facility] Counselor from 01/05/2022 in [facility] Office Visit from 10/21/2021 in [facility] Video Visit from 08/19/2021 in [facility]  Total GAD-7 Score '7 10 6 10 16      '$ PHQ2-9    Flowsheet Row  Clinical Support from 02/10/2022 in [facility] Office Visit from 01/19/2022 in [facility] Counselor from 01/05/2022 in [facility] Office Visit from 10/21/2021 in [facility] Video Visit from 08/19/2021 in [facility]  PHQ-2 Total Score 0 0 '2 2 3  '$ PHQ-9 Total Score -- '2 9 2 12      '$ Flowsheet Row Clinical Support from 02/10/2022 in [facility] Office Visit from 01/19/2022 in [facility] Video Visit from 08/19/2021 in [place] CATEGORY Low Risk Low Risk Low Risk        Assessment and Plan:   Patty Stanley is a 41 year old female with a past psychiatric history significant for depression with anxiety and attention/concentration deficit who presents to [facility], accompanied by her mother, for follow-up and medication management.  Patient reports that she has been able to eat well since discontinuing her Seroquel.  Patient notes that she has issues with distraction which may be attributed to taking patient off her atomoxetine.  Patient was taken off her atomoxetine to avoid suppression of appetite while on the medication.  Patient denies depressive symptoms and states that her anxiety is manageable.  Patient to continue taking her medications as prescribed.  During patient's next follow-up visit, patient to be considered for atomoxetine for the management of her attention/concentration.   Patient has been experiencing varied sleep.  Provider recommended patient be placed on ramelteon 8 mg at bedtime for the management of her sleep issues.  Patient was agreeable to recommendation.  Patient's medication to be e-prescribed to pharmacy  of choice.  Collaboration of Care: Collaboration of Care: Medication Management AEB provider managing patient's psychiatric medications, Psychiatrist AEB patient being followed by mental health provider, and Referral or follow-up with counselor/therapist AEB patient being seen by a licensed clinical social worker at this facility  Patient/Guardian was advised Release of Information must be obtained prior to any record release in order to collaborate their care with an outside provider. Patient/Guardian was advised if they have not already done so to contact the registration department to sign all necessary forms in order for [place] to release information regarding their care.   Consent: Patient/Guardian gives verbal consent for treatment and assignment of benefits for services provided during this visit. Patient/Guardian expressed understanding and agreed to proceed.   1. Depression with anxiety Patient to continue taking sertraline 100 mg daily for the management of her depression with anxiety Patient to continue taking buspirone 7.5 mg 2 times daily for the management of her depression and anxiety  2. Insomnia, unspecified type  - ramelteon (ROZEREM) 8 MG tablet; Take 1 tablet (8 mg total) by mouth at bedtime.  Dispense: 30 tablet; Refill: 1  Patient to follow in 6 weeks Providers spent a total of 14 minutes with the patient/reviewing patient's chart  [name], PA 02/11/2022, 8:08 PM

## 2022-02-14 ENCOUNTER — Other Ambulatory Visit (HOSPITAL_COMMUNITY): Payer: Self-pay

## 2022-02-14 MED ORDER — HYDROXYZINE HCL 25 MG PO TABS
ORAL_TABLET | Freq: Three times a day (TID) | ORAL | 0 refills | Status: DC | PRN
  Filled 2022-02-14: qty 90, 30d supply, fill #0

## 2022-02-16 ENCOUNTER — Other Ambulatory Visit (HOSPITAL_COMMUNITY): Payer: Self-pay

## 2022-02-17 ENCOUNTER — Telehealth (HOSPITAL_COMMUNITY): Payer: Self-pay

## 2022-02-17 ENCOUNTER — Other Ambulatory Visit: Payer: Self-pay

## 2022-02-17 NOTE — Telephone Encounter (Signed)
Attempted to contact patient to reschedule Modified Barium Swallow - voicemail is not set up.

## 2022-02-21 ENCOUNTER — Other Ambulatory Visit (HOSPITAL_COMMUNITY): Payer: Self-pay

## 2022-02-21 MED ORDER — HYDROXYZINE HCL 25 MG PO TABS
25.0000 mg | ORAL_TABLET | Freq: Three times a day (TID) | ORAL | 0 refills | Status: DC | PRN
  Filled 2022-02-21: qty 90, 30d supply, fill #0

## 2022-02-22 ENCOUNTER — Other Ambulatory Visit (HOSPITAL_COMMUNITY): Payer: Self-pay

## 2022-02-24 ENCOUNTER — Other Ambulatory Visit (HOSPITAL_COMMUNITY): Payer: Self-pay

## 2022-02-24 ENCOUNTER — Telehealth (HOSPITAL_COMMUNITY): Payer: Self-pay

## 2022-02-24 NOTE — Telephone Encounter (Signed)
Called and spoke with patient to reschedule Modified Barium Swallow - patient stated she would not like to reschedule at this time. Currently waiting on test results from another procedure. She will contact MD office if she would like to reschedule. Closed order.

## 2022-02-28 ENCOUNTER — Ambulatory Visit (HOSPITAL_COMMUNITY): Payer: No Payment, Other | Admitting: Licensed Clinical Social Worker

## 2022-03-07 ENCOUNTER — Other Ambulatory Visit (HOSPITAL_COMMUNITY): Payer: Self-pay

## 2022-03-07 MED ORDER — OMEPRAZOLE 20 MG PO CPDR
20.0000 mg | DELAYED_RELEASE_CAPSULE | Freq: Every day | ORAL | 0 refills | Status: DC
  Filled 2022-03-07: qty 90, 90d supply, fill #0

## 2022-03-14 ENCOUNTER — Ambulatory Visit (INDEPENDENT_AMBULATORY_CARE_PROVIDER_SITE_OTHER): Payer: No Payment, Other | Admitting: Licensed Clinical Social Worker

## 2022-03-14 ENCOUNTER — Other Ambulatory Visit (HOSPITAL_COMMUNITY): Payer: Self-pay

## 2022-03-14 DIAGNOSIS — F411 Generalized anxiety disorder: Secondary | ICD-10-CM | POA: Diagnosis not present

## 2022-03-14 NOTE — Progress Notes (Signed)
   THERAPIST PROGRESS NOTE  Session Time: 45 minutes  Participation Level: {BHH PARTICIPATION LEVEL:22264}  Behavioral Response: {Appearance:22683}{BHH LEVEL OF CONSCIOUSNESS:22305}{BHH MOOD:22306}  Type of Therapy: {CHL AMB BH Type of Therapy:21022741}  Treatment Goals addressed: ***  ProgressTowards Goals: {Progress Towards Goals:21014066}  Interventions: {CHL AMB BH Type of Intervention:21022753}  Summary: Kenlea Woodell is a 41 y.o. female who presents with ***.   Suicidal/Homicidal: {BHH YES OR NO:22294}{yes/no/with/without intent/plan:22693}  Therapist Response: ***Cln reiterated importance of nutritionist/RD f/u.  Plan: Return again in 4 weeks.  Diagnosis: GAD (generalized anxiety disorder)  Collaboration of Care: {BH OP Collaboration of Care:21014065}  Patient/Guardian was advised Release of Information must be obtained prior to any record release in order to collaborate their care with an outside provider. Patient/Guardian was advised if they have not already done so to contact the registration department to sign all necessary forms in order for [place] to release information regarding their care.   Consent: Patient/Guardian gives verbal consent for treatment and assignment of benefits for services provided during this visit. Patient/Guardian expressed understanding and agreed to proceed.   Heron Nay, LCSWA 03/14/2022

## 2022-03-18 ENCOUNTER — Other Ambulatory Visit (HOSPITAL_COMMUNITY): Payer: Self-pay

## 2022-03-18 MED ORDER — OLANZAPINE 5 MG PO TABS
ORAL_TABLET | ORAL | 0 refills | Status: DC
  Filled 2022-03-18: qty 30, 30d supply, fill #0

## 2022-03-19 ENCOUNTER — Other Ambulatory Visit (HOSPITAL_COMMUNITY): Payer: Self-pay

## 2022-03-21 ENCOUNTER — Other Ambulatory Visit (HOSPITAL_COMMUNITY): Payer: Self-pay

## 2022-03-28 ENCOUNTER — Ambulatory Visit (INDEPENDENT_AMBULATORY_CARE_PROVIDER_SITE_OTHER): Payer: No Payment, Other | Admitting: Psychiatry

## 2022-03-28 ENCOUNTER — Other Ambulatory Visit (HOSPITAL_COMMUNITY): Payer: Self-pay

## 2022-03-28 DIAGNOSIS — F418 Other specified anxiety disorders: Secondary | ICD-10-CM

## 2022-03-28 MED ORDER — HYDROXYZINE HCL 25 MG PO TABS
25.0000 mg | ORAL_TABLET | Freq: Three times a day (TID) | ORAL | 2 refills | Status: DC | PRN
  Filled 2022-03-28: qty 90, 30d supply, fill #0
  Filled 2022-04-20: qty 90, 30d supply, fill #1
  Filled 2022-05-05: qty 90, 30d supply, fill #2

## 2022-03-28 MED ORDER — SERTRALINE HCL 100 MG PO TABS
100.0000 mg | ORAL_TABLET | Freq: Every day | ORAL | 2 refills | Status: DC
  Filled 2022-03-28: qty 30, 30d supply, fill #0
  Filled 2022-05-05: qty 30, 30d supply, fill #1

## 2022-03-28 NOTE — Patient Instructions (Signed)
Follow up in 2 months

## 2022-03-28 NOTE — Progress Notes (Signed)
BH MD/PA/NP OP Progress Note  03/28/2022 2:39 PM Patty Stanley  MRN:  [id number]  Chief Complaint:  Chief Complaint  Patient presents with   Follow-up   Medication Refill   HPI:   Patty Stanley is a 41 year old female with a past psychiatric history significant for depression with anxiety and attention/concentration deficit who presents to [facility], for follow-up and medication management.   Pt reports that her mood is " anxious". She denies any depressed mood.  She reports that she is not taking Seroquel or BuSpar or Strattera but only taking Zoloft 100 mg daily, and hydroxyzine 25 mg 3 times daily.  She reports that her PCP started her on Zyprexa 5 mg nightly to help with appetite.  She reports that previously she was not enough due to poor appetite and lost a lot of weight but now her appetite has improved and she is eating better.  She has been sleeping ok.  She is not taking ramelteon.  She reports that she used trazodone in the past which caused her tactile hallucinations.  she is requesting to remove all her past expired medications from her chart.  Currently, she denies any suicidal ideations, homicidal ideations, auditory and visual hallucinations.  She denies paranoia.  She denies any medication side effects and has been tolerating it well. She reports that her husband got hurt in an accident in 2022.  She reports multiple stressors including financial stressors, stressors related to her husband's health and anxiety about getting into another accident.   She is currently unemployed and has applied for disability.  She lives with her husband. She denies drinking alcohol  and denies using any illicit drugs. She does not smoke cigarettes.  Patient denies any need for change in medication and medication dosages and wants to continue same meds.  She denies any other concerns. Patient is alert and oriented x 4,  calm, cooperative, and fully  engaged in conversation during the encounter.  Her thought process is linear with coherent speech . She does not appear to be responding to internal/external stimuli .     Visit Diagnosis:    ICD-10-CM   1. Depression with anxiety  F41.8 sertraline (ZOLOFT) 100 MG tablet    hydrOXYzine (ATARAX) 25 MG tablet      Past Psychiatric History:  Depression Anxiety  Past Medical History:  Past Medical History:  Diagnosis Date   Alcohol abuse    ALLERGIC RHINITIS    Anxiety    ASTHMA    DEPRESSION    Headache(784.0)    OSTEOARTHRITIS    UNSPECIFIED TACHYCARDIA     Past Surgical History:  Procedure Laterality Date   WISDOM TOOTH EXTRACTION      Family Psychiatric History:  Patient is adopted  Family History:  Family History  Adopted: Yes  Problem Relation Age of Onset   Alcohol abuse Other    Arthritis Other     Social History:  Social History   Socioeconomic History   Marital status: Married    Spouse name: Not on file   Number of children: Not on file   Years of education: Not on file   Highest education level: Not on file  Occupational History   Occupation: [employer]    Employer: [employer]  Tobacco Use   Smoking status: Never   Smokeless tobacco: Never  Substance and Sexual Activity   Alcohol use: No   Drug use: No   Sexual activity: Yes  Birth control/protection: Pill  Other Topics Concern   Not on file  Social History Narrative   Not on file   Social Determinants of Health   Financial Resource Strain: Not on file  Food Insecurity: Not on file  Transportation Needs: Not on file  Physical Activity: Not on file  Stress: Not on file  Social Connections: Not on file    Allergies:  Allergies  Allergen Reactions   Penicillins Hives    REACTION: Itching    Metabolic Disorder Labs: Lab Results  Component Value Date   HGBA1C 5.7 11/29/2012   No results found for: "PROLACTIN" Lab Results  Component Value Date   CHOL 167 11/29/2012    TRIG 113.0 11/29/2012   HDL 49.20 11/29/2012   CHOLHDL 3 11/29/2012   VLDL 22.6 11/29/2012   [place] 95 11/29/2012   Lab Results  Component Value Date   TSH 1.12 11/29/2012   TSH 2.23 12/10/2010    Therapeutic Level Labs: No results found for: "LITHIUM" No results found for: "VALPROATE" No results found for: "CBMZ"  Current Medications: Current Outpatient Medications  Medication Sig Dispense Refill   albuterol (PROVENTIL HFA) 108 (90 BASE) MCG/ACT inhaler Inhale 2 puffs into the lungs every 6 (six) hours as needed for wheezing. 3 Inhaler 3   albuterol (VENTOLIN HFA) 108 (90 Base) MCG/ACT inhaler Inhale 1 puff into the lungs every 6 hours as needed. 8.5 g 0   albuterol (VENTOLIN HFA) 108 (90 Base) MCG/ACT inhaler Inhale 1 puff by mouth into the lungs every 8 hours as needed 8.5 g 0   atomoxetine (STRATTERA) 25 MG capsule Take 1 capsule by mouth daily. 30 capsule 2   azithromycin (ZITHROMAX) 250 MG tablet Take 2 tablets by mouth today then take 1 tablet by mouth daily for 4 days 18 tablet 0   beclomethasone (QVAR) 80 MCG/ACT inhaler Inhale 1 puff into the lungs 2 (two) times daily. (Patient taking differently: Inhale 1 puff into the lungs 2 (two) times daily as needed (asthma). ) 3 Inhaler 3   budesonide-formoterol (SYMBICORT) 160-4.5 MCG/ACT inhaler Inhale 1 puff into the lungs twice daily as needed. 10.2 g 0   cetirizine (ZYRTEC) 10 MG tablet Take 10 mg by mouth daily.     chlorhexidine (PERIDEX) 0.12 % solution Use 21m to gargle and spit twice daily. 473 mL 11   cyproheptadine (PERIACTIN) 2 MG/5ML syrup Take 5 ml (2 mg) by mouth every 8 hours as needed- patient to hold atarax while on this medication 473 mL 0   desloratadine (CLARINEX) 5 MG tablet Take 1 tablet (5 mg total) by mouth daily. 90 tablet 3   doxycycline (VIBRAMYCIN) 100 MG capsule Take 1 capsule by mouth every 12 hours for 10 days 20 capsule 0   ergocalciferol (VITAMIN D2) 1.25 MG (50000 UT) capsule Take 1 capsule (1.25  mg) by mouth weekly 12 capsule 0   hydrOXYzine (ATARAX) 25 MG tablet Take 1 tablet (25 mg total) by mouth every 8 (eight) hours as needed. 90 tablet 0   hydrOXYzine (ATARAX) 25 MG tablet Take 1 tablet (25 mg total) by mouth 3 (three) times daily as needed for anxiety. 90 tablet 2   levocetirizine (XYZAL) 5 MG tablet TAKE ONE TABLET BY MOUTH DAILY IN THE EVENING 90 tablet 3   mometasone (NASONEX) 50 MCG/ACT nasal spray Place 24 sprays into the nose daily. (Patient not taking: Reported on 08/20/2014) 51 g 3   montelukast (SINGULAIR) 10 MG tablet Take 1 tablet (10 mg total)  by mouth daily. 90 tablet 3   montelukast (SINGULAIR) 10 MG tablet TAKE 1 TABLET BY MOUTH ONCE DAILY 90 tablet 0   montelukast (SINGULAIR) 10 MG tablet Take 1 tablet by mouth daily 90 tablet 0   nebivolol (BYSTOLIC) 5 MG tablet TAKE 1 TABLET BY MOUTH ONCE A DAY 90 tablet 0   nebivolol (BYSTOLIC) 5 MG tablet TAKE 1 TABLET BY MOUTH DAILY 90 tablet 0   nebivolol (BYSTOLIC) 5 MG tablet Take 1 tablet (5 mg) by mouth daily 90 tablet 0   norgestimate-ethinyl estradiol (ORTHO-CYCLEN) 0.25-35 MG-MCG tablet TAKE 1 TABLET BY MOUTH ONCE DAILY CONTINUOUSLY AS DIRECTED 84 tablet 0   norgestimate-ethinyl estradiol (VYLIBRA) 0.25-35 MG-MCG tablet Take 1 tablet by mouth once daily as directed-ok continuous dosing 84 tablet 0   OLANZapine (ZYPREXA) 5 MG tablet Take 1 tablet (5 mg) by mouth daily 30 tablet 0   omeprazole (PRILOSEC) 20 MG capsule TAKE 1 CAPSULE BY MOUTH ONCE DAILY 90 capsule 0   omeprazole (PRILOSEC) 20 MG capsule Take 1 capsule (20 mg total) by mouth daily. 90 capsule 0   omeprazole (PRILOSEC) 40 MG capsule Take 40 mg by mouth daily.     predniSONE (DELTASONE) 10 MG tablet Take 1 tablet by mouth daily 10 tablet 0   sertraline (ZOLOFT) 100 MG tablet Take 1 tablet (100 mg total) by mouth daily. 30 tablet 2   No current facility-administered medications for this visit.     Musculoskeletal: Strength & Muscle Tone: within normal  limits Gait & Station: unsteady, patient utilizes a cane while ambulating Patient leans: patient utilizes a cane  Psychiatric Specialty Exam: Review of Systems  Blood pressure 112/80, pulse (!) 104, SpO2 100 %.There is no height or weight on file to calculate BMI.  General Appearance: Casual  Eye Contact:  Good  Speech:  Clear and Coherent and Normal Rate  Volume:  Normal  Mood:  Anxious  Affect:  Appropriate  Thought Process:  Coherent and Descriptions of Associations: Intact  Orientation:  Full (Time, Place, and Person)  Thought Content: WDL   Suicidal Thoughts:  No  Homicidal Thoughts:  No  Memory:  Immediate;   Good Recent;   Good Remote;   Good  Judgement:  Fair  Insight:  Good  Psychomotor Activity:  Normal  Concentration:  Concentration: Good and Attention Span: Good  Recall:  Good  Fund of Knowledge: Good  Language: Good  Akathisia:  No  Handed:  Right  AIMS (if indicated): not done  Assets:  Communication Skills Desire for Improvement Financial Resources/Insurance Housing Social Support  ADL's:  Intact  Cognition: WNL  Sleep:  Fair   Screenings: GAD-7    Personnel officer Visit from 02/10/2022 in [facility] Office Visit from 01/19/2022 in [facility] Counselor from 01/05/2022 in [facility] Office Visit from 10/21/2021 in [facility] Video Visit from 08/19/2021 in [facility]  Total GAD-7 Score '7 10 6 10 16      '$ PHQ2-9    [place] Office Visit from 02/10/2022 in [facility] Office Visit from 01/19/2022 in [facility] Counselor from 01/05/2022 in [facility] Office Visit from 10/21/2021 in [facility] Video Visit from 08/19/2021 in [facility]  PHQ-2 Total Score 0 0 '2 2 3  '$ PHQ-9  Total Score -- '2 9 2 '$ 12  [place] Office Visit from 02/10/2022 in [facility] Office Visit from 01/19/2022 in [facility] Video Visit from 08/19/2021 in [place] CATEGORY Low Risk Low Risk Low Risk        Assessment and Plan:   Patty Stanley is a 41 year old female with a past psychiatric history significant for depression with anxiety and attention/concentration deficit who presents to [facility], for follow-up and medication management.  Patient is reporting improvement in her mood, appetite after she stopping Seroquel and Strattera.  She has been tolerating her current medications wants to continue same medication.  She was started on Zyprexa by PCP to improve her appetite.  Will recommend stopping Zyprexa due to no indication.  Collaboration of Care: Collaboration of Care: Dr Sande Rives Patient/Guardian was advised Release of Information must be obtained prior to any record release in order to collaborate their care with an outside provider. Patient/Guardian was advised if they have not already done so to contact the registration department to sign all necessary forms in order for [place] to release information regarding their care.   Consent: Patient/Guardian gives verbal consent for treatment and assignment of benefits for services provided during this visit. Patient/Guardian expressed understanding and agreed to proceed.   1. Depression with anxiety Patient to continue taking sertraline 100 mg daily for the management of her depression with anxiety Patient to continue taking hydroxyzine 25 mg 3 times daily as needed for anxiety. Patient was started Zyprexa 5 mg nightly for poor appetite appetite by her PCP.  Stop Zyprexa due to no indication.  Patient does not have diagnosis of bipolar disorder or psychosis.   Patient to follow in 8  weeks Providers spent a total of 20 minutes with the patient/reviewing patient's chart  Armando Reichert, MD 03/28/2022, 2:39 PM

## 2022-03-31 ENCOUNTER — Other Ambulatory Visit (HOSPITAL_COMMUNITY): Payer: Self-pay

## 2022-03-31 MED ORDER — OMEPRAZOLE 20 MG PO CPDR
20.0000 mg | DELAYED_RELEASE_CAPSULE | Freq: Every day | ORAL | 0 refills | Status: DC
  Filled 2022-03-31 – 2022-06-16 (×2): qty 90, 90d supply, fill #0

## 2022-04-04 ENCOUNTER — Ambulatory Visit (INDEPENDENT_AMBULATORY_CARE_PROVIDER_SITE_OTHER): Payer: No Payment, Other | Admitting: Licensed Clinical Social Worker

## 2022-04-04 DIAGNOSIS — F418 Other specified anxiety disorders: Secondary | ICD-10-CM

## 2022-04-04 DIAGNOSIS — F411 Generalized anxiety disorder: Secondary | ICD-10-CM

## 2022-04-04 NOTE — Progress Notes (Signed)
   THERAPIST PROGRESS NOTE  Session Time: 45 minutes  Participation Level: {BHH PARTICIPATION LEVEL:22264}  Behavioral Response: {Appearance:22683}{BHH LEVEL OF CONSCIOUSNESS:22305}{BHH MOOD:22306}  Type of Therapy: {CHL AMB BH Type of Therapy:21022741}  Treatment Goals addressed: ***  ProgressTowards Goals: {Progress Towards Goals:21014066}  Interventions: {CHL AMB BH Type of Intervention:21022753}  Summary: Yentl Verge is a 41 y.o. female who presents with ***.   Suicidal/Homicidal: {BHH YES OR NO:22294}{yes/no/with/without intent/plan:22693}  Therapist Response: ***  Plan: Return again in 1 weeks.  Diagnosis: GAD (generalized anxiety disorder)  Collaboration of Care: Other none required for this visit  Patient/Guardian was advised Release of Information must be obtained prior to any record release in order to collaborate their care with an outside provider. Patient/Guardian was advised if they have not already done so to contact the registration department to sign all necessary forms in order for [place] to release information regarding their care.   Consent: Patient/Guardian gives verbal consent for treatment and assignment of benefits for services provided during this visit. Patient/Guardian expressed understanding and agreed to proceed.   Heron Nay, LCSWA 04/04/2022

## 2022-04-11 ENCOUNTER — Ambulatory Visit (INDEPENDENT_AMBULATORY_CARE_PROVIDER_SITE_OTHER): Payer: No Payment, Other | Admitting: Licensed Clinical Social Worker

## 2022-04-11 DIAGNOSIS — F418 Other specified anxiety disorders: Secondary | ICD-10-CM | POA: Diagnosis not present

## 2022-04-11 NOTE — Progress Notes (Signed)
   THERAPIST PROGRESS NOTE  Session Time: 50 minutes  Participation Level: {BHH PARTICIPATION LEVEL:22264}  Behavioral Response: {Appearance:22683}{BHH LEVEL OF CONSCIOUSNESS:22305}{BHH MOOD:22306}  Type of Therapy: {CHL AMB BH Type of Therapy:21022741}  Treatment Goals addressed: ***  ProgressTowards Goals: {Progress Towards Goals:21014066}  Interventions: {CHL AMB BH Type of Intervention:21022753}  Summary: Patty Stanley is a 41 y.o. female who presents with ***.   Suicidal/Homicidal: {BHH YES OR NO:22294}{yes/no/with/without intent/plan:22693}  Therapist Response: ***  Plan: Return again in *** weeks.  Diagnosis: Depression with anxiety  Collaboration of Care: {BH OP Collaboration of Care:21014065}  Patient/Guardian was advised Release of Information must be obtained prior to any record release in order to collaborate their care with an outside provider. Patient/Guardian was advised if they have not already done so to contact the registration department to sign all necessary forms in order for [place] to release information regarding their care.   Consent: Patient/Guardian gives verbal consent for treatment and assignment of benefits for services provided during this visit. Patient/Guardian expressed understanding and agreed to proceed.   Heron Nay, LCSWA 04/11/2022

## 2022-04-20 ENCOUNTER — Other Ambulatory Visit (HOSPITAL_COMMUNITY): Payer: Self-pay

## 2022-04-20 MED ORDER — MONTELUKAST SODIUM 10 MG PO TABS
10.0000 mg | ORAL_TABLET | Freq: Every day | ORAL | 0 refills | Status: DC
  Filled 2022-04-20: qty 90, 90d supply, fill #0

## 2022-05-02 ENCOUNTER — Encounter (HOSPITAL_COMMUNITY): Payer: Self-pay

## 2022-05-02 ENCOUNTER — Ambulatory Visit (HOSPITAL_COMMUNITY): Payer: No Payment, Other | Admitting: Licensed Clinical Social Worker

## 2022-05-05 ENCOUNTER — Other Ambulatory Visit (HOSPITAL_COMMUNITY): Payer: Self-pay

## 2022-05-20 ENCOUNTER — Other Ambulatory Visit (HOSPITAL_COMMUNITY): Payer: Self-pay

## 2022-05-20 MED ORDER — NEBIVOLOL HCL 5 MG PO TABS
5.0000 mg | ORAL_TABLET | Freq: Every day | ORAL | 0 refills | Status: DC
  Filled 2022-05-20: qty 90, 90d supply, fill #0

## 2022-05-30 ENCOUNTER — Encounter (HOSPITAL_COMMUNITY): Payer: Self-pay | Admitting: Psychiatry

## 2022-05-30 ENCOUNTER — Ambulatory Visit (INDEPENDENT_AMBULATORY_CARE_PROVIDER_SITE_OTHER): Payer: No Payment, Other | Admitting: Psychiatry

## 2022-05-30 ENCOUNTER — Other Ambulatory Visit (HOSPITAL_COMMUNITY): Payer: Self-pay

## 2022-05-30 ENCOUNTER — Ambulatory Visit (INDEPENDENT_AMBULATORY_CARE_PROVIDER_SITE_OTHER): Payer: No Payment, Other | Admitting: Licensed Clinical Social Worker

## 2022-05-30 DIAGNOSIS — F418 Other specified anxiety disorders: Secondary | ICD-10-CM

## 2022-05-30 DIAGNOSIS — F411 Generalized anxiety disorder: Secondary | ICD-10-CM | POA: Diagnosis not present

## 2022-05-30 MED ORDER — HYDROXYZINE HCL 25 MG PO TABS
25.0000 mg | ORAL_TABLET | Freq: Two times a day (BID) | ORAL | 2 refills | Status: DC | PRN
  Filled 2022-05-30 – 2022-08-04 (×2): qty 90, 45d supply, fill #0
  Filled 2022-11-03: qty 90, 45d supply, fill #1

## 2022-05-30 MED ORDER — HYDROXYZINE HCL 50 MG PO TABS
50.0000 mg | ORAL_TABLET | Freq: Every evening | ORAL | 2 refills | Status: DC | PRN
  Filled 2022-05-30: qty 30, 30d supply, fill #0
  Filled 2022-06-28: qty 30, 30d supply, fill #1
  Filled 2022-08-17: qty 30, 30d supply, fill #2

## 2022-05-30 MED ORDER — SERTRALINE HCL 100 MG PO TABS
150.0000 mg | ORAL_TABLET | Freq: Every day | ORAL | 2 refills | Status: DC
  Filled 2022-05-30 – 2022-05-31 (×2): qty 45, 30d supply, fill #0
  Filled 2022-06-28: qty 45, 30d supply, fill #1
  Filled 2022-08-17: qty 45, 30d supply, fill #2

## 2022-05-30 NOTE — Progress Notes (Signed)
   THERAPIST PROGRESS NOTE  Session Time: 50 minutes  Participation Level: {BHH PARTICIPATION LEVEL:22264}  Behavioral Response: {Appearance:22683}{BHH LEVEL OF CONSCIOUSNESS:22305}{BHH MOOD:22306}  Type of Therapy: {CHL AMB BH Type of Therapy:21022741}  Treatment Goals addressed: ***  ProgressTowards Goals: {Progress Towards Goals:21014066}  Interventions: {CHL AMB BH Type of Intervention:21022753}  Summary: Canna Nickelson is a 42 y.o. female who presents with ***.   Suicidal/Homicidal: {BHH YES OR NO:22294}{yes/no/with/without intent/plan:22693}  Therapist Response: ***  Plan: Return again in 3 weeks.  Diagnosis: GAD (generalized anxiety disorder)  Collaboration of Care: {BH OP Collaboration of Care:21014065}  Patient/Guardian was advised Release of Information must be obtained prior to any record release in order to collaborate their care with an outside provider. Patient/Guardian was advised if they have not already done so to contact the registration department to sign all necessary forms in order for [place] to release information regarding their care.   Consent: Patient/Guardian gives verbal consent for treatment and assignment of benefits for services provided during this visit. Patient/Guardian expressed understanding and agreed to proceed.   Heron Nay, LCSWA 05/30/2022
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
## 2022-05-31 ENCOUNTER — Other Ambulatory Visit (HOSPITAL_COMMUNITY): Payer: Self-pay

## 2022-06-16 ENCOUNTER — Other Ambulatory Visit (HOSPITAL_COMMUNITY): Payer: Self-pay

## 2022-06-16 MED ORDER — LEVOCETIRIZINE DIHYDROCHLORIDE 5 MG PO TABS
5.0000 mg | ORAL_TABLET | Freq: Every evening | ORAL | 0 refills | Status: DC
  Filled 2022-06-16: qty 90, 90d supply, fill #0

## 2022-06-20 ENCOUNTER — Ambulatory Visit (HOSPITAL_COMMUNITY): Payer: No Payment, Other | Admitting: Licensed Clinical Social Worker

## 2022-06-28 ENCOUNTER — Other Ambulatory Visit (HOSPITAL_COMMUNITY): Payer: Self-pay

## 2022-06-28 ENCOUNTER — Other Ambulatory Visit: Payer: Self-pay

## 2022-06-28 MED ORDER — MONTELUKAST SODIUM 10 MG PO TABS
10.0000 mg | ORAL_TABLET | Freq: Every day | ORAL | 0 refills | Status: DC
  Filled 2022-06-28: qty 90, 90d supply, fill #0

## 2022-07-01 ENCOUNTER — Other Ambulatory Visit (HOSPITAL_COMMUNITY): Payer: Self-pay

## 2022-07-04 ENCOUNTER — Ambulatory Visit (INDEPENDENT_AMBULATORY_CARE_PROVIDER_SITE_OTHER): Payer: Medicare Other | Admitting: Licensed Clinical Social Worker

## 2022-07-04 DIAGNOSIS — F411 Generalized anxiety disorder: Secondary | ICD-10-CM
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
## 2022-07-25 ENCOUNTER — Ambulatory Visit (HOSPITAL_COMMUNITY): Payer: Medicare Other | Admitting: Licensed Clinical Social Worker

## 2022-07-28 ENCOUNTER — Telehealth (HOSPITAL_COMMUNITY): Payer: Self-pay | Admitting: Licensed Clinical Social Worker

## 2022-07-29 ENCOUNTER — Encounter (HOSPITAL_COMMUNITY): Payer: Medicare Other | Admitting: Psychiatry

## 2022-08-03 ENCOUNTER — Other Ambulatory Visit (HOSPITAL_COMMUNITY): Payer: Self-pay

## 2022-08-04 ENCOUNTER — Other Ambulatory Visit (HOSPITAL_COMMUNITY): Payer: Self-pay

## 2022-08-05 ENCOUNTER — Other Ambulatory Visit (HOSPITAL_COMMUNITY): Payer: Self-pay

## 2022-08-06 ENCOUNTER — Other Ambulatory Visit (HOSPITAL_COMMUNITY): Payer: Self-pay

## 2022-08-08 ENCOUNTER — Ambulatory Visit (HOSPITAL_COMMUNITY): Payer: Medicare Other | Admitting: Licensed Clinical Social Worker

## 2022-08-17 ENCOUNTER — Other Ambulatory Visit (HOSPITAL_COMMUNITY): Payer: Self-pay

## 2022-08-19 ENCOUNTER — Other Ambulatory Visit (HOSPITAL_COMMUNITY): Payer: Self-pay

## 2022-08-19 MED ORDER — NEBIVOLOL HCL 5 MG PO TABS
5.0000 mg | ORAL_TABLET | Freq: Every day | ORAL | 1 refills | Status: DC
  Filled 2022-08-19: qty 90, 90d supply, fill #0
  Filled 2022-11-11: qty 30, 30d supply, fill #1
  Filled 2022-12-19 – 2022-12-26 (×2): qty 30, 30d supply, fill #2
  Filled 2023-01-18 – 2023-01-28 (×2): qty 30, 30d supply, fill #3

## 2022-09-12 ENCOUNTER — Other Ambulatory Visit (HOSPITAL_COMMUNITY): Payer: Self-pay

## 2022-09-13 ENCOUNTER — Other Ambulatory Visit (HOSPITAL_COMMUNITY): Payer: Self-pay

## 2022-09-13 MED ORDER — OMEPRAZOLE 20 MG PO CPDR
DELAYED_RELEASE_CAPSULE | ORAL | 1 refills | Status: DC
  Filled 2022-09-13: qty 90, 90d supply, fill #0
  Filled 2022-12-12 – 2022-12-26 (×2): qty 90, 90d supply, fill #1

## 2022-09-14 ENCOUNTER — Other Ambulatory Visit (HOSPITAL_COMMUNITY): Payer: Self-pay

## 2022-09-19 ENCOUNTER — Other Ambulatory Visit (HOSPITAL_COMMUNITY): Payer: Self-pay

## 2022-10-03 ENCOUNTER — Other Ambulatory Visit (HOSPITAL_COMMUNITY): Payer: Self-pay

## 2022-10-04 ENCOUNTER — Other Ambulatory Visit (HOSPITAL_COMMUNITY): Payer: Self-pay

## 2022-10-05 ENCOUNTER — Other Ambulatory Visit (HOSPITAL_COMMUNITY): Payer: Self-pay

## 2022-10-05 MED ORDER — SERTRALINE HCL 100 MG PO TABS
100.0000 mg | ORAL_TABLET | Freq: Every day | ORAL | 0 refills | Status: DC
  Filled 2022-10-05: qty 90, 90d supply, fill #0

## 2022-10-05 MED ORDER — HYDROXYZINE HCL 50 MG PO TABS
50.0000 mg | ORAL_TABLET | Freq: Every day | ORAL | 1 refills | Status: DC
  Filled 2022-10-05: qty 30, 30d supply, fill #0
  Filled 2022-11-03: qty 30, 30d supply, fill #1

## 2022-10-12 ENCOUNTER — Other Ambulatory Visit (HOSPITAL_COMMUNITY): Payer: Self-pay

## 2022-10-14 ENCOUNTER — Other Ambulatory Visit (HOSPITAL_COMMUNITY): Payer: Self-pay

## 2022-10-24 ENCOUNTER — Other Ambulatory Visit (HOSPITAL_COMMUNITY): Payer: Self-pay

## 2022-10-24 MED ORDER — MONTELUKAST SODIUM 10 MG PO TABS
10.0000 mg | ORAL_TABLET | Freq: Every evening | ORAL | 0 refills | Status: DC
  Filled 2022-10-24: qty 90, 90d supply, fill #0

## 2022-11-03 ENCOUNTER — Other Ambulatory Visit (HOSPITAL_COMMUNITY): Payer: Self-pay

## 2022-11-14 ENCOUNTER — Other Ambulatory Visit (HOSPITAL_COMMUNITY): Payer: Self-pay

## 2022-11-24 ENCOUNTER — Other Ambulatory Visit (HOSPITAL_COMMUNITY): Payer: Self-pay

## 2022-11-25 ENCOUNTER — Other Ambulatory Visit (HOSPITAL_COMMUNITY): Payer: Self-pay

## 2022-11-28 ENCOUNTER — Other Ambulatory Visit (HOSPITAL_COMMUNITY): Payer: Self-pay

## 2022-11-30 ENCOUNTER — Other Ambulatory Visit (HOSPITAL_COMMUNITY): Payer: Self-pay

## 2022-11-30 MED ORDER — HYDROXYZINE HCL 50 MG PO TABS
50.0000 mg | ORAL_TABLET | Freq: Every day | ORAL | 1 refills | Status: DC
  Filled 2022-11-30: qty 30, 30d supply, fill #0
  Filled 2022-12-26: qty 30, 30d supply, fill #1

## 2022-12-02 ENCOUNTER — Other Ambulatory Visit (HOSPITAL_COMMUNITY): Payer: Self-pay

## 2022-12-03 ENCOUNTER — Other Ambulatory Visit (HOSPITAL_COMMUNITY): Payer: Self-pay

## 2022-12-21 ENCOUNTER — Other Ambulatory Visit (HOSPITAL_COMMUNITY): Payer: Self-pay

## 2022-12-26 ENCOUNTER — Other Ambulatory Visit (HOSPITAL_COMMUNITY): Payer: Self-pay
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
## 2022-12-28 ENCOUNTER — Other Ambulatory Visit (HOSPITAL_COMMUNITY): Payer: Self-pay

## 2022-12-30 ENCOUNTER — Other Ambulatory Visit (HOSPITAL_COMMUNITY): Payer: Self-pay

## 2023-01-02 ENCOUNTER — Other Ambulatory Visit (HOSPITAL_COMMUNITY): Payer: Self-pay

## 2023-01-02 MED ORDER — SERTRALINE HCL 100 MG PO TABS
100.0000 mg | ORAL_TABLET | Freq: Every day | ORAL | 0 refills | Status: DC
  Filled 2023-01-02: qty 90, 90d supply, fill #0

## 2023-01-04 ENCOUNTER — Ambulatory Visit (INDEPENDENT_AMBULATORY_CARE_PROVIDER_SITE_OTHER): Payer: Medicare Other | Admitting: Psychiatry

## 2023-01-04 ENCOUNTER — Encounter (HOSPITAL_COMMUNITY): Payer: Self-pay | Admitting: Psychiatry

## 2023-01-04 VITALS — HR 83 | Ht 59.0 in | Wt 151.0 lb

## 2023-01-04 DIAGNOSIS — F411 Generalized anxiety disorder: Secondary | ICD-10-CM

## 2023-01-04 DIAGNOSIS — F418 Other specified anxiety disorders: Secondary | ICD-10-CM

## 2023-01-04 DIAGNOSIS — E559 Vitamin D deficiency, unspecified: Secondary | ICD-10-CM | POA: Diagnosis not present

## 2023-01-04 DIAGNOSIS — F1021 Alcohol dependence, in remission: Secondary | ICD-10-CM | POA: Diagnosis not present

## 2023-01-04 DIAGNOSIS — F79 Unspecified intellectual disabilities: Secondary | ICD-10-CM

## 2023-01-04 DIAGNOSIS — F339 Major depressive disorder, recurrent, unspecified: Secondary | ICD-10-CM | POA: Diagnosis not present

## 2023-01-04 DIAGNOSIS — F329 Major depressive disorder, single episode, unspecified: Secondary | ICD-10-CM | POA: Insufficient documentation

## 2023-01-04 DIAGNOSIS — F5105 Insomnia due to other mental disorder: Secondary | ICD-10-CM

## 2023-01-04 DIAGNOSIS — F419 Anxiety disorder, unspecified: Secondary | ICD-10-CM

## 2023-01-04 MED ORDER — HYDROXYZINE HCL 50 MG PO TABS
50.0000 mg | ORAL_TABLET | Freq: Every day | ORAL | 3 refills | Status: DC
  Filled 2023-01-04 – 2023-01-27 (×2): qty 30, 30d supply, fill #0
  Filled 2023-02-24 (×2): qty 30, 30d supply, fill #1
  Filled 2023-03-23: qty 30, 30d supply, fill #2

## 2023-01-04 MED ORDER — HYDROXYZINE HCL 25 MG PO TABS
25.0000 mg | ORAL_TABLET | Freq: Two times a day (BID) | ORAL | 3 refills | Status: DC | PRN
  Filled 2023-01-04: qty 90, 45d supply, fill #0

## 2023-01-04 MED ORDER — SERTRALINE HCL 100 MG PO TABS
150.0000 mg | ORAL_TABLET | Freq: Every day | ORAL | 3 refills | Status: DC
  Filled 2023-01-04 – 2023-01-06 (×2): qty 45, 30d supply, fill #0
  Filled 2023-01-30: qty 45, 30d supply, fill #1
  Filled 2023-02-28: qty 45, 30d supply, fill #2
  Filled 2023-03-30: qty 45, 30d supply, fill #3

## 2023-01-05 ENCOUNTER — Other Ambulatory Visit (HOSPITAL_COMMUNITY): Payer: Self-pay

## 2023-01-05 ENCOUNTER — Other Ambulatory Visit: Payer: Self-pay

## 2023-01-05 NOTE — Addendum Note (Signed)
Addended by: Tia Masker on: 01/05/2023 12:07 PM   Modules accepted: Level of Service

## 2023-01-05 NOTE — Patient Instructions (Signed)
Thank you for attending your appointment today.  -- continue taking sertraline (Zoloft) 150 mg daily. If you develop a decreased appetite, go back to taking 100 mg daily -- continue taking hydroxyzine 25 mg twice a day as needed for anxiety and 50 mg scheduled at bedtime  Please do not make any changes to medications without first discussing with your provider. If you are experiencing a psychiatric emergency, please call 911 or present to your nearest emergency department. Additional crisis, medication management, and therapy resources are included below.  [facility]  [address] [place], [place] [address] [phone] WALK-IN URGENT CARE 24/7 FOR ANYONE [address] [place], [place]  [phone] Fax: [phone] [email or web address] *Interpreters available *Accepts all insurance and uninsured for Urgent Care needs *Accepts Medicaid and uninsured for outpatient treatment (below)      ONLY FOR [facility]  Below:    Outpatient New Patient Assessment/Therapy Walk-ins:        Monday -Thursday 8am until slots are full.        Every Friday 1pm-4pm  (first come, first served)                   New Patient Psychiatry/Medication Management        Monday-Friday 8am-11am (first come, first served)               For all walk-ins we ask that you arrive by 7:15am, because patients will be seen in the order of arrival.

## 2023-01-06 ENCOUNTER — Other Ambulatory Visit (HOSPITAL_COMMUNITY): Payer: Self-pay

## 2023-01-13 ENCOUNTER — Telehealth (HOSPITAL_COMMUNITY): Payer: Self-pay | Admitting: Mental Health

## 2023-01-13 NOTE — Telephone Encounter (Signed)
LVM TO REMIND PT OF 9/4 APPT.

## 2023-01-16 ENCOUNTER — Other Ambulatory Visit (HOSPITAL_COMMUNITY): Payer: Self-pay

## 2023-01-17 ENCOUNTER — Other Ambulatory Visit (HOSPITAL_COMMUNITY): Payer: Self-pay

## 2023-01-17 MED ORDER — MONTELUKAST SODIUM 10 MG PO TABS
10.0000 mg | ORAL_TABLET | Freq: Every day | ORAL | 2 refills | Status: DC
  Filled 2023-01-17: qty 90, 90d supply, fill #0
  Filled 2023-04-11: qty 90, 90d supply, fill #1

## 2023-01-18 ENCOUNTER — Ambulatory Visit (HOSPITAL_COMMUNITY): Payer: Medicare Other | Admitting: Mental Health

## 2023-01-23 ENCOUNTER — Other Ambulatory Visit (HOSPITAL_COMMUNITY): Payer: Medicare Other

## 2023-01-27 ENCOUNTER — Other Ambulatory Visit (HOSPITAL_COMMUNITY): Payer: Self-pay

## 2023-01-27 LAB — SPECIMEN STATUS REPORT

## 2023-01-27 LAB — COMPREHENSIVE METABOLIC PANEL
ALT: 13 IU/L (ref 0–32)
AST: 23 IU/L (ref 0–40)
Albumin: 4.2 g/dL (ref 3.9–4.9)
Alkaline Phosphatase: 130 IU/L — ABNORMAL HIGH (ref 44–121)
BUN/Creatinine Ratio: 18 (ref 9–23)
BUN: 14 mg/dL (ref 6–24)
Bilirubin Total: 0.3 mg/dL (ref 0.0–1.2)
CO2: 24 mmol/L (ref 20–29)
Calcium: 8.7 mg/dL (ref 8.7–10.2)
Chloride: 96 mmol/L (ref 96–106)
Creatinine, Ser: 0.8 mg/dL (ref 0.57–1.00)
Globulin, Total: 2.7 g/dL (ref 1.5–4.5)
Glucose: 77 mg/dL (ref 70–99)
Potassium: 4.9 mmol/L (ref 3.5–5.2)
Sodium: 139 mmol/L (ref 134–144)
Total Protein: 6.9 g/dL (ref 6.0–8.5)
eGFR: 94 mL/min/{1.73_m2} (ref >59)

## 2023-01-27 LAB — CBC WITH DIFFERENTIAL/PLATELET
Basophils Absolute: 0.1 10*3/uL (ref 0.0–0.2)
Basos: 1 %
EOS (ABSOLUTE): 0.2 10*3/uL (ref 0.0–0.4)
Eos: 3 %
Hematocrit: 40.7 % (ref 34.0–46.6)
Hemoglobin: 12.9 g/dL (ref 11.1–15.9)
Immature Grans (Abs): 0 10*3/uL (ref 0.0–0.1)
Immature Granulocytes: 0 %
Lymphocytes Absolute: 2.1 10*3/uL (ref 0.7–3.1)
Lymphs: 34 %
MCH: 28.7 pg (ref 26.6–33.0)
MCHC: 31.7 g/dL (ref 31.5–35.7)
MCV: 90 fL (ref 79–97)
Monocytes Absolute: 0.3 10*3/uL (ref 0.1–0.9)
Monocytes: 6 %
Neutrophils Absolute: 3.4 10*3/uL (ref 1.4–7.0)
Neutrophils: 56 %
Platelets: 295 10*3/uL (ref 150–450)
RBC: 4.5 x10E6/uL (ref 3.77–5.28)
RDW: 12.3 % (ref 11.7–15.4)
WBC: 6.1 10*3/uL (ref 3.4–10.8)

## 2023-01-27 LAB — IRON,TIBC AND FERRITIN PANEL
Ferritin: 11 ng/mL — ABNORMAL LOW (ref 15–150)
Iron Saturation: 11 % — ABNORMAL LOW (ref 15–55)
Iron: 53 ug/dL (ref 27–159)
Total Iron Binding Capacity: 463 ug/dL — ABNORMAL HIGH (ref 250–450)
UIBC: 410 ug/dL (ref 131–425)

## 2023-01-27 LAB — B12 AND FOLATE PANEL
Folate: 7.9 ng/mL (ref >3.0)
Vitamin B-12: 496 pg/mL (ref 232–1245)

## 2023-01-27 LAB — TSH: TSH: 1.51 u[IU]/mL (ref 0.450–4.500)

## 2023-01-27 LAB — VITAMIN D 25 HYDROXY (VIT D DEFICIENCY, FRACTURES): Vit D, 25-Hydroxy: 14.6 ng/mL — ABNORMAL LOW (ref 30.0–100.0)

## 2023-01-28 ENCOUNTER — Other Ambulatory Visit (HOSPITAL_COMMUNITY): Payer: Self-pay

## 2023-02-13 ENCOUNTER — Other Ambulatory Visit (HOSPITAL_COMMUNITY): Payer: Self-pay

## 2023-02-13 MED ORDER — AZITHROMYCIN 250 MG PO TABS
ORAL_TABLET | ORAL | 0 refills | Status: DC
  Filled 2023-02-13: qty 6, 5d supply, fill #0

## 2023-02-13 MED ORDER — PREDNISONE 20 MG PO TABS
20.0000 mg | ORAL_TABLET | Freq: Every day | ORAL | 0 refills | Status: DC
  Filled 2023-02-13: qty 3, 3d supply, fill #0

## 2023-02-14 ENCOUNTER — Other Ambulatory Visit (HOSPITAL_COMMUNITY): Payer: Self-pay

## 2023-02-20 ENCOUNTER — Other Ambulatory Visit (HOSPITAL_COMMUNITY): Payer: Self-pay

## 2023-02-20 MED ORDER — NEBIVOLOL HCL 5 MG PO TABS
5.0000 mg | ORAL_TABLET | Freq: Every day | ORAL | 3 refills | Status: DC
  Filled 2023-02-20: qty 90, 90d supply, fill #0
  Filled 2023-05-22: qty 90, 90d supply, fill #1
  Filled 2023-08-23: qty 90, 90d supply, fill #2
  Filled 2023-11-21: qty 90, 90d supply, fill #3

## 2023-02-22 ENCOUNTER — Ambulatory Visit (INDEPENDENT_AMBULATORY_CARE_PROVIDER_SITE_OTHER): Payer: Medicare Other | Admitting: Mental Health

## 2023-02-22 DIAGNOSIS — F331 Major depressive disorder, recurrent, moderate: Secondary | ICD-10-CM | POA: Diagnosis not present

## 2023-02-22 DIAGNOSIS — F411 Generalized anxiety disorder: Secondary | ICD-10-CM

## 2023-02-24 ENCOUNTER — Other Ambulatory Visit (HOSPITAL_COMMUNITY): Payer: Self-pay

## 2023-02-24 ENCOUNTER — Other Ambulatory Visit: Payer: Self-pay

## 2023-02-24 DIAGNOSIS — F331 Major depressive disorder, recurrent, moderate: Secondary | ICD-10-CM | POA: Insufficient documentation
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
## 2023-02-28 ENCOUNTER — Other Ambulatory Visit: Payer: Self-pay

## 2023-03-20 ENCOUNTER — Other Ambulatory Visit (HOSPITAL_COMMUNITY): Payer: Self-pay

## 2023-03-22 ENCOUNTER — Other Ambulatory Visit (HOSPITAL_COMMUNITY): Payer: Self-pay

## 2023-03-22 MED ORDER — OMEPRAZOLE 20 MG PO CPDR
20.0000 mg | DELAYED_RELEASE_CAPSULE | Freq: Every day | ORAL | 3 refills | Status: DC
  Filled 2023-03-22: qty 90, 90d supply, fill #0
  Filled 2023-06-14: qty 90, 90d supply, fill #1
  Filled 2023-09-12: qty 90, 90d supply, fill #2
  Filled 2023-12-11: qty 90, 90d supply, fill #3

## 2023-03-23 ENCOUNTER — Other Ambulatory Visit (HOSPITAL_COMMUNITY): Payer: Self-pay

## 2023-03-30 ENCOUNTER — Other Ambulatory Visit (HOSPITAL_COMMUNITY): Payer: Self-pay

## 2023-04-07 ENCOUNTER — Other Ambulatory Visit (HOSPITAL_COMMUNITY): Payer: Self-pay

## 2023-04-11 ENCOUNTER — Other Ambulatory Visit: Payer: Self-pay

## 2023-04-18 ENCOUNTER — Encounter (HOSPITAL_COMMUNITY): Payer: Self-pay

## 2023-04-18 ENCOUNTER — Ambulatory Visit (INDEPENDENT_AMBULATORY_CARE_PROVIDER_SITE_OTHER): Payer: Medicare Other | Admitting: Mental Health

## 2023-04-18 DIAGNOSIS — F331 Major depressive disorder, recurrent, moderate: Secondary | ICD-10-CM

## 2023-04-18 DIAGNOSIS — F411 Generalized anxiety disorder: Secondary | ICD-10-CM

## 2023-04-18 NOTE — Progress Notes (Unsigned)
   THERAPIST PROGRESS NOTE  Session Time: 3:05 (  Participation Level: Active  Behavioral Response: CasualAlertDysphoric  Type of Therapy: Individual Therapy  Treatment Goals addressed: ***  ProgressTowards Goals: Progressing  Interventions: CBT and Supportive  Summary: Patty Stanley is a 42 y.o. female who presents with ***.   Suicidal/Homicidal: Nowithout intent/plan  Therapist Response: ***  Plan: Return again in *** weeks.  Diagnosis: Moderate recurrent major depression (HCC)  GAD (generalized anxiety disorder)  Collaboration of Care: Other None  Patient/Guardian was advised Release of Information must be obtained prior to any record release in order to collaborate their care with an outside provider. Patient/Guardian was advised if they have not already done so to contact the registration department to sign all necessary forms in order for [place] to release information regarding their care.   Consent: Patient/Guardian gives verbal consent for treatment and assignment of benefits for services provided during this visit. Patient/Guardian expressed understanding and agreed to proceed.   Stephan Minister [place], [facility] 04/18/2023

## 2023-04-19 ENCOUNTER — Ambulatory Visit (INDEPENDENT_AMBULATORY_CARE_PROVIDER_SITE_OTHER): Payer: Medicare Other | Admitting: Psychiatry

## 2023-04-19 ENCOUNTER — Encounter (HOSPITAL_COMMUNITY): Payer: Self-pay | Admitting: Psychiatry

## 2023-04-19 ENCOUNTER — Other Ambulatory Visit: Payer: Self-pay

## 2023-04-19 ENCOUNTER — Other Ambulatory Visit (HOSPITAL_COMMUNITY): Payer: Self-pay

## 2023-04-19 VITALS — HR 77 | Wt 149.5 lb

## 2023-04-19 DIAGNOSIS — F411 Generalized anxiety disorder: Secondary | ICD-10-CM | POA: Diagnosis not present

## 2023-04-19 DIAGNOSIS — D509 Iron deficiency anemia, unspecified: Secondary | ICD-10-CM

## 2023-04-19 DIAGNOSIS — E559 Vitamin D deficiency, unspecified: Secondary | ICD-10-CM

## 2023-04-19 DIAGNOSIS — F331 Major depressive disorder, recurrent, moderate: Secondary | ICD-10-CM

## 2023-04-19 DIAGNOSIS — F339 Major depressive disorder, recurrent, unspecified: Secondary | ICD-10-CM

## 2023-04-19 DIAGNOSIS — F5089 Other specified eating disorder: Secondary | ICD-10-CM

## 2023-04-19 MED ORDER — HYDROXYZINE HCL 25 MG PO TABS
25.0000 mg | ORAL_TABLET | Freq: Two times a day (BID) | ORAL | 0 refills | Status: DC | PRN
  Filled 2023-04-19 (×2): qty 90, 45d supply, fill #0

## 2023-04-19 MED ORDER — FERROUS SULFATE 325 (65 FE) MG PO TABS: 325.0000 mg | ORAL_TABLET | ORAL | Status: DC

## 2023-04-19 MED ORDER — SERTRALINE HCL 100 MG PO TABS
150.0000 mg | ORAL_TABLET | Freq: Every day | ORAL | 1 refills | Status: DC
  Filled 2023-04-19 – 2023-04-28 (×2): qty 45, 30d supply, fill #0
  Filled 2023-05-26: qty 45, 30d supply, fill #1

## 2023-04-19 MED ORDER — ERGOCALCIFEROL 1.25 MG (50000 UT) PO CAPS
50000.0000 [IU] | ORAL_CAPSULE | ORAL | 0 refills | Status: DC
  Filled 2023-04-19 (×2): qty 7, 49d supply, fill #0

## 2023-04-19 MED ORDER — MIRTAZAPINE 7.5 MG PO TABS
7.5000 mg | ORAL_TABLET | Freq: Every day | ORAL | 3 refills | Status: DC
Start: 2023-04-19 — End: 2023-06-07
  Filled 2023-04-19 (×2): qty 30, 30d supply, fill #0
  Filled 2023-05-12: qty 30, 30d supply, fill #1

## 2023-04-19 NOTE — Progress Notes (Signed)
   THERAPIST PROGRESS NOTE  Session Time: 3: 06 pm ( 50 minutes)   Participation Level: Active  Behavioral Response: CasualAlertDysphoric  Type of Therapy: Individual Therapy  Treatment Goals addressed: STG:" I am always worried." Caterina will decrease sxs of anxiety AEB development of x coping skills for anxiety with ability to engage in self-care at least x 1 weekly within the next 90 days.   ProgressTowards Goals: Initial  Interventions: Supportive  Summary: Trenisha Runck is a 42 y.o. female who presents with diagnoses of major depression moderate and generalized anxiety disorder. Lonnie presents for session alert and oriented; mood and affect adequate but dyphoric in presentation. Notes increase in feelings of stress and low mood noting for husband to have been in the hospital x 3 in the past month with heart related concerns. Shares for car to also be giving them trouble and ongoing feelings of grief of their pet cat whom passed away several months ago. Shares stressors also in regard to husband family and interacting with sister in law and mother in Social worker. Shares for mother in law just to present to the home at times with no notice in which sister does not inform them ahead of time. Shares would like to work on increasing ability to manage stress and safe space to process feelings Notes coping skills in place of sewing and doing creative arts. Agrees to treatment plan. No safety concerns. Denies SI/HI GAD: 19 PHQ: 20  Suicidal/Homicidal: Nowithout intent/plan  Therapist Response: Therapist engaged Merri in therapy session. Reviewed intake assessment and provided safe space to share thoughts and concerns. Assessed for current level of functioning, stressors and coping mechanisms in placed. Assessed for medication compliance. Supported in processing thoughts and feelings in regard to stress and concerns for husband. Provided support feedback and validated feelings. Explored presence of  boundaries and communication with in laws and husband's ability to advocate and discuss concerns with them. Reviewed GAD and PHQ scores and provided feedback. Engaged in treatment planning and reviewed session.   Plan: Return again in  x 8 weeks.  Diagnosis: Moderate recurrent major depression (HCC)  GAD (generalized anxiety disorder)  Collaboration of Care: Other None  Patient/Guardian was advised Release of Information must be obtained prior to any record release in order to collaborate their care with an outside provider. Patient/Guardian was advised if they have not already done so to contact the registration department to sign all necessary forms in order for [place] to release information regarding their care.   Consent: Patient/Guardian gives verbal consent for treatment and assignment of benefits for services provided during this visit. Patient/Guardian expressed understanding and agreed to proceed.   Stephan Minister [place], [facility] 04/19/2023

## 2023-04-19 NOTE — Patient Instructions (Addendum)
Thank you for attending your appointment today.  -- continue taking sertraline (Zoloft) 150 mg daily -- continue taking hydroxyzine (Atarax / Vistaril) 25 mg twice daily -- stop taking hydroxyzine (Atarax / Vistaril) 50 mg at bedtime -- start taking mirtazapine (Remeron) 7.5 mg at bedtime -- start taking vitamin D 50,000 IU once a week -- start taking iron supplement 325 mg every other day (you can buy this over the counter)  Please do not make any changes to medications without first discussing with your provider. If you are experiencing a psychiatric emergency, please call 911 or present to your nearest emergency department. Additional crisis, medication management, and therapy resources are included below.  [facility]  [address], [place], [place] [address] [phone] WALK-IN URGENT CARE 24/7 FOR ANYONE [address], [place], [place]  [phone] Fax: [phone] [email or web address] *Interpreters available *Accepts all insurance and uninsured for Urgent Care needs *Accepts Medicaid and uninsured for outpatient treatment (below)      ONLY FOR [facility]  Below:    Outpatient New Patient Assessment/Therapy Walk-ins:        Monday -Thursday 8am until slots are full.        Every Friday 1pm-4pm  (first come, first served)                   New Patient Psychiatry/Medication Management        Monday-Friday 8am-11am (first come, first served)               For all walk-ins we ask that you arrive by 7:15am, because patients will be seen in the order of arrival.
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
## 2023-04-24 ENCOUNTER — Other Ambulatory Visit (HOSPITAL_BASED_OUTPATIENT_CLINIC_OR_DEPARTMENT_OTHER): Payer: Self-pay

## 2023-04-28 ENCOUNTER — Other Ambulatory Visit (HOSPITAL_COMMUNITY): Payer: Self-pay

## 2023-05-01 ENCOUNTER — Encounter (HOSPITAL_COMMUNITY): Payer: Self-pay

## 2023-05-12 ENCOUNTER — Other Ambulatory Visit: Payer: Self-pay

## 2023-05-19 ENCOUNTER — Other Ambulatory Visit (HOSPITAL_COMMUNITY): Payer: Self-pay

## 2023-05-26 ENCOUNTER — Other Ambulatory Visit: Payer: Self-pay

## 2023-05-27 ENCOUNTER — Other Ambulatory Visit (HOSPITAL_COMMUNITY): Payer: Self-pay | Admitting: Psychiatry

## 2023-05-27 DIAGNOSIS — F411 Generalized anxiety disorder: Secondary | ICD-10-CM

## 2023-05-31 ENCOUNTER — Encounter (HOSPITAL_COMMUNITY): Payer: Medicare Other | Admitting: Psychiatry
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
## 2023-06-07 ENCOUNTER — Other Ambulatory Visit (HOSPITAL_COMMUNITY): Payer: Self-pay

## 2023-06-07 ENCOUNTER — Ambulatory Visit (INDEPENDENT_AMBULATORY_CARE_PROVIDER_SITE_OTHER): Payer: Medicare Other | Admitting: Psychiatry

## 2023-06-07 VITALS — BP 105/77 | Temp 98.2°F

## 2023-06-07 DIAGNOSIS — E559 Vitamin D deficiency, unspecified: Secondary | ICD-10-CM

## 2023-06-07 DIAGNOSIS — F411 Generalized anxiety disorder: Secondary | ICD-10-CM

## 2023-06-07 DIAGNOSIS — F331 Major depressive disorder, recurrent, moderate: Secondary | ICD-10-CM

## 2023-06-07 DIAGNOSIS — Z789 Other specified health status: Secondary | ICD-10-CM

## 2023-06-07 MED ORDER — ERGOCALCIFEROL 1.25 MG (50000 UT) PO CAPS
50000.0000 [IU] | ORAL_CAPSULE | ORAL | 1 refills | Status: AC
  Filled 2023-06-07: qty 4, 28d supply, fill #0
  Filled 2023-06-21: qty 4, 28d supply, fill #1

## 2023-06-07 MED ORDER — SERTRALINE HCL 100 MG PO TABS
150.0000 mg | ORAL_TABLET | Freq: Every day | ORAL | 1 refills | Status: DC
  Filled 2023-06-07 – 2023-06-26 (×2): qty 45, 30d supply, fill #0
  Filled 2023-07-25: qty 45, 30d supply, fill #1

## 2023-06-07 MED ORDER — MIRTAZAPINE 15 MG PO TABS
15.0000 mg | ORAL_TABLET | Freq: Every day | ORAL | 1 refills | Status: DC
  Filled 2023-06-07: qty 30, 30d supply, fill #0
  Filled 2023-07-01 – 2023-07-12 (×2): qty 30, 30d supply, fill #1

## 2023-06-07 MED ORDER — HYDROXYZINE HCL 25 MG PO TABS
25.0000 mg | ORAL_TABLET | Freq: Two times a day (BID) | ORAL | 1 refills | Status: DC | PRN
  Filled 2023-06-07: qty 60, 30d supply, fill #0
  Filled 2023-07-28: qty 60, 30d supply, fill #1

## 2023-06-08 ENCOUNTER — Other Ambulatory Visit (HOSPITAL_COMMUNITY): Payer: Self-pay

## 2023-06-08 ENCOUNTER — Other Ambulatory Visit: Payer: Self-pay

## 2023-06-08 NOTE — Addendum Note (Signed)
Addended by: Tia Masker on: 06/08/2023 04:33 PM   Modules accepted: Level of Service

## 2023-06-14 ENCOUNTER — Other Ambulatory Visit (HOSPITAL_COMMUNITY): Payer: Self-pay

## 2023-06-19 ENCOUNTER — Other Ambulatory Visit (HOSPITAL_COMMUNITY): Payer: Self-pay

## 2023-06-21 ENCOUNTER — Other Ambulatory Visit (HOSPITAL_COMMUNITY): Payer: Self-pay

## 2023-06-21 ENCOUNTER — Ambulatory Visit (HOSPITAL_COMMUNITY): Payer: Medicare Other | Admitting: Mental Health

## 2023-06-21 MED ORDER — MONTELUKAST SODIUM 10 MG PO TABS
10.0000 mg | ORAL_TABLET | Freq: Every day | ORAL | 3 refills | Status: DC
  Filled 2023-06-21: qty 90, 90d supply, fill #0
  Filled 2023-09-22: qty 90, 90d supply, fill #1
  Filled 2023-12-26: qty 90, 90d supply, fill #2

## 2023-06-26 ENCOUNTER — Other Ambulatory Visit: Payer: Self-pay

## 2023-06-26 ENCOUNTER — Other Ambulatory Visit (HOSPITAL_COMMUNITY): Payer: Self-pay

## 2023-06-26 MED ORDER — MELOXICAM 7.5 MG PO TABS
7.5000 mg | ORAL_TABLET | Freq: Every day | ORAL | 0 refills | Status: DC
  Filled 2023-06-26: qty 30, 30d supply, fill #0

## 2023-06-27 ENCOUNTER — Other Ambulatory Visit (HOSPITAL_COMMUNITY): Payer: Self-pay

## 2023-07-03 ENCOUNTER — Other Ambulatory Visit (HOSPITAL_COMMUNITY): Payer: Self-pay
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
## 2023-07-11 ENCOUNTER — Other Ambulatory Visit (HOSPITAL_COMMUNITY): Payer: Self-pay

## 2023-07-12 ENCOUNTER — Encounter (HOSPITAL_COMMUNITY): Payer: Medicare Other | Admitting: Psychiatry

## 2023-07-17 ENCOUNTER — Other Ambulatory Visit (HOSPITAL_COMMUNITY): Payer: Self-pay

## 2023-07-27 ENCOUNTER — Other Ambulatory Visit (HOSPITAL_COMMUNITY): Payer: Self-pay

## 2023-07-28 ENCOUNTER — Other Ambulatory Visit (HOSPITAL_COMMUNITY): Payer: Self-pay

## 2023-08-01 ENCOUNTER — Other Ambulatory Visit: Payer: Self-pay

## 2023-08-04 ENCOUNTER — Telehealth (HOSPITAL_COMMUNITY): Payer: Self-pay

## 2023-08-04 ENCOUNTER — Other Ambulatory Visit (HOSPITAL_COMMUNITY): Payer: Self-pay

## 2023-08-04 NOTE — Telephone Encounter (Signed)
 Patient states she is going to run out of her Remeron 15mg  tomorrow 08/05/23. Patient states she reached out to the pharmacy & they sent a request to [place]. Thanks

## 2023-08-09 ENCOUNTER — Other Ambulatory Visit (HOSPITAL_COMMUNITY): Payer: Self-pay

## 2023-08-10 ENCOUNTER — Other Ambulatory Visit (HOSPITAL_COMMUNITY): Payer: Self-pay

## 2023-08-10 ENCOUNTER — Telehealth (HOSPITAL_COMMUNITY): Payer: Self-pay | Admitting: *Deleted

## 2023-08-10 MED ORDER — MIRTAZAPINE 15 MG PO TABS
15.0000 mg | ORAL_TABLET | Freq: Every day | ORAL | 1 refills | Status: DC
  Filled 2023-08-10: qty 30, 30d supply, fill #0
  Filled 2023-09-04: qty 30, 30d supply, fill #1

## 2023-08-10 NOTE — Telephone Encounter (Signed)
 Patient Refill Request mirtazapine (REMERON) 15 MG tablet   [facility]

## 2023-08-10 NOTE — Telephone Encounter (Signed)
 I sent a refill of Remeron for the patient to bridge until the next scheduled appointment.

## 2023-08-10 NOTE — Addendum Note (Signed)
 Addended by: Kizzie Ide B on: 08/10/2023 09:35 AM   Modules accepted: Orders

## 2023-08-19 ENCOUNTER — Other Ambulatory Visit (HOSPITAL_COMMUNITY): Payer: Self-pay

## 2023-08-21 ENCOUNTER — Other Ambulatory Visit (HOSPITAL_COMMUNITY): Payer: Self-pay

## 2023-08-23 ENCOUNTER — Other Ambulatory Visit: Payer: Self-pay

## 2023-08-23 ENCOUNTER — Encounter (HOSPITAL_COMMUNITY): Payer: Medicare Other | Admitting: Psychiatry

## 2023-08-25 ENCOUNTER — Other Ambulatory Visit (HOSPITAL_COMMUNITY): Payer: Self-pay

## 2023-08-25 ENCOUNTER — Telehealth (HOSPITAL_COMMUNITY): Payer: Self-pay

## 2023-08-25 NOTE — Telephone Encounter (Signed)
 Hello,    Pt is requesting for a refill of Sertraline, Remeron, and hydroxyzine to be sent to pharmacy.  [facility] patient [facility].   Pts app was cancelled for this month and scheduled for May instead.      JNL, CMA

## 2023-08-28 ENCOUNTER — Other Ambulatory Visit (HOSPITAL_COMMUNITY): Payer: Self-pay

## 2023-08-30 ENCOUNTER — Other Ambulatory Visit (HOSPITAL_COMMUNITY): Payer: Self-pay

## 2023-08-30 ENCOUNTER — Telehealth (HOSPITAL_COMMUNITY): Payer: Self-pay

## 2023-08-30 MED ORDER — HYDROXYZINE HCL 25 MG PO TABS
25.0000 mg | ORAL_TABLET | Freq: Two times a day (BID) | ORAL | 1 refills | Status: DC | PRN
  Filled 2023-08-30: qty 60, 30d supply, fill #0
  Filled 2023-09-22: qty 60, 30d supply, fill #1

## 2023-08-30 MED ORDER — SERTRALINE HCL 100 MG PO TABS
150.0000 mg | ORAL_TABLET | Freq: Every day | ORAL | 1 refills | Status: DC
  Filled 2023-08-30: qty 45, 30d supply, fill #0
  Filled 2023-09-26: qty 45, 30d supply, fill #1

## 2023-08-30 NOTE — Telephone Encounter (Signed)
 Attempted to reach out to Pt to pick up meds at pharmacy.   JNL, CMA

## 2023-08-30 NOTE — Telephone Encounter (Signed)
 Patient called requesting med refill for Hydroxyzine 25 mg & Zoloft 100 mg. Patient states she has 1 day left & her Anxiety is high because she only has 1 day. Patient was also requesting Remeron 15 mg but I explained to her that it was already sent on 08/10/23 with 1 refill. Patient was ok with that. Please send refill to preferred pharmacy as patient will need them ASAP. Patient next appointment is scheduled for 10/11/23 @ 1 with Dr. Camillo Celestine. Thanks!

## 2023-08-30 NOTE — Addendum Note (Signed)
 Addended by: Saratha Cunas B on: 08/30/2023 11:40 AM   Modules accepted: Orders

## 2023-09-06 ENCOUNTER — Ambulatory Visit (HOSPITAL_COMMUNITY): Payer: Medicare Other | Admitting: Mental Health

## 2023-09-06 ENCOUNTER — Encounter (HOSPITAL_COMMUNITY): Payer: Self-pay

## 2023-09-06 DIAGNOSIS — F331 Major depressive disorder, recurrent, moderate: Secondary | ICD-10-CM

## 2023-09-06 DIAGNOSIS — F411 Generalized anxiety disorder: Secondary | ICD-10-CM

## 2023-09-06 NOTE — Progress Notes (Signed)
   THERAPIST PROGRESS NOTE Virtual Visit via Video Note  I connected with Patty Stanley on 09/06/23 at  3:00 PM EDT by a video enabled telemedicine application and verified that I am speaking with the correct person using two identifiers.  Location: Patient: home address on file Provider: office   I discussed the limitations of evaluation and management by telemedicine and the availability of in person appointments. The patient expressed understanding and agreed to proceed.  I discussed the assessment and treatment plan with the patient. The patient was provided an opportunity to ask questions and all were answered. The patient agreed with the plan and demonstrated an understanding of the instructions.   The patient was advised to call back or seek an in-person evaluation if the symptoms worsen or if the condition fails to improve as anticipated.  I provided 33 minutes of non-face-to-face time during this encounter.   Loman Risk, [facility]   Session Time: 3:14 pm ( 33 minutes)  Participation Level: Active  Behavioral Response: CasualAlertDysphoric  Type of Therapy: Individual Therapy  Treatment Goals addressed:  STG:" I am always worried." Day will decrease sxs of anxiety AEB development of x coping skills for anxiety with ability to engage in self-care at least x 1 weekly within the next 90 days.   ProgressTowards Goals: Progressing  Interventions: Supportive  Summary: Patty Stanley is a 43 y.o. female who presents with diagnoses of major depression moderate and generalized anxiety disorder. Shadiamond presents for session alert and oriented; mood and affect adequate but dyphoric in presentation. Notes increase in feelings of stress sharing for car to have broken down, husband to be in and out of the hospital and feelings of frustration with providers. Shares working to keep her anger managed with interacting with medical providers. Shares engaging in self-care with  making things and being creative. Shares to be medication compliant and working to manage stressors    Suicidal/Homicidal: Nowithout intent/plan  Therapist Response:  Therapist engaged [place] in tele-therapy session. Assessed for current level of functioning, stressors and coping mechanisms in placed. Assessed for medication compliance. Provides support and encouragement; validated feelings. Explored ability to assertive communicate and advocate for husband. Explored use of coping skills to support through feelings of stress.   Plan: Return again in x 7 weeks.  Diagnosis: MDD (major depressive disorder), recurrent episode, moderate (HCC)  GAD (generalized anxiety disorder)  Collaboration of Care: Other None  Patient/Guardian was advised Release of Information must be obtained prior to any record release in order to collaborate their care with an outside provider. Patient/Guardian was advised if they have not already done so to contact the registration department to sign all necessary forms in order for us  to release information regarding their care.   Consent: Patient/Guardian gives verbal consent for treatment and assignment of benefits for services provided during this visit. Patient/Guardian expressed understanding and agreed to proceed.   Carmel Chimes [place], [facility] 09/06/2023

## 2023-09-16 ENCOUNTER — Other Ambulatory Visit: Payer: Self-pay

## 2023-09-22 ENCOUNTER — Other Ambulatory Visit (HOSPITAL_COMMUNITY): Payer: Self-pay

## 2023-09-26 ENCOUNTER — Other Ambulatory Visit (HOSPITAL_COMMUNITY): Payer: Self-pay

## 2023-10-03 ENCOUNTER — Other Ambulatory Visit (HOSPITAL_COMMUNITY): Payer: Self-pay

## 2023-10-05 ENCOUNTER — Other Ambulatory Visit (HOSPITAL_COMMUNITY): Payer: Self-pay

## 2023-10-10 ENCOUNTER — Other Ambulatory Visit (HOSPITAL_COMMUNITY): Payer: Self-pay
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
## 2023-10-11 ENCOUNTER — Encounter (HOSPITAL_COMMUNITY): Admitting: Psychiatry

## 2023-10-11 ENCOUNTER — Telehealth (HOSPITAL_COMMUNITY): Payer: Self-pay

## 2023-10-11 ENCOUNTER — Encounter (HOSPITAL_COMMUNITY): Payer: Self-pay

## 2023-10-11 NOTE — Telephone Encounter (Signed)
 Is there anyway that this Pt can have a bridge on her meds, Pt will be coming on July the first, but will needs meds to get through the month of June.    JNL

## 2023-10-11 NOTE — Telephone Encounter (Signed)
 Due to appointment being rescheduled, patient will need refills on medication. Appointment has been rescheduled for November 14, 2023 @ 9 a.m.Patty Stanley Appointment was rescheduled because there is no attending to supervise provider. Please send refills to pharmacy ASAP. Thanks!

## 2023-10-12 ENCOUNTER — Telehealth (HOSPITAL_COMMUNITY): Payer: Self-pay | Admitting: *Deleted

## 2023-10-12 ENCOUNTER — Other Ambulatory Visit (HOSPITAL_COMMUNITY): Payer: Self-pay

## 2023-10-12 ENCOUNTER — Other Ambulatory Visit: Payer: Self-pay | Admitting: Psychiatry

## 2023-10-12 DIAGNOSIS — F331 Major depressive disorder, recurrent, moderate: Secondary | ICD-10-CM

## 2023-10-12 DIAGNOSIS — F411 Generalized anxiety disorder: Secondary | ICD-10-CM

## 2023-10-12 MED ORDER — SERTRALINE HCL 100 MG PO TABS
150.0000 mg | ORAL_TABLET | Freq: Every day | ORAL | 1 refills | Status: DC
  Filled 2023-10-12 – 2023-10-26 (×2): qty 45, 30d supply, fill #0

## 2023-10-12 MED ORDER — HYDROXYZINE HCL 25 MG PO TABS
25.0000 mg | ORAL_TABLET | Freq: Two times a day (BID) | ORAL | 1 refills | Status: DC | PRN
  Filled 2023-10-12 – 2023-10-27 (×2): qty 60, 30d supply, fill #0

## 2023-10-12 MED ORDER — MIRTAZAPINE 15 MG PO TABS
15.0000 mg | ORAL_TABLET | Freq: Every day | ORAL | 1 refills | Status: DC
  Filled 2023-10-12: qty 30, 30d supply, fill #0
  Filled 2023-11-04: qty 30, 30d supply, fill #1

## 2023-10-12 NOTE — Telephone Encounter (Signed)
 Called to see if she can get a bridge of meds as her appt yesterday was cancelled by this clinic. She has a future appt on 11/14/23. She is going to be seeing Dr Camillo Celestine. I will forward this message to provider for a rx bridge. Notified patient request was forwarded to the provider and didn't see an issue with the provider bridging her meds.

## 2023-10-12 NOTE — Progress Notes (Unsigned)
 I refilled patient's Zoloft , Remeron , and Atarax  to bridge her to the next OP appointment which is on 11/14/23.  Saratha Cunas, MD PGY-2 Psychiatry

## 2023-10-26 ENCOUNTER — Other Ambulatory Visit (HOSPITAL_COMMUNITY): Payer: Self-pay

## 2023-10-26 ENCOUNTER — Other Ambulatory Visit: Payer: Self-pay

## 2023-10-27 ENCOUNTER — Other Ambulatory Visit (HOSPITAL_COMMUNITY): Payer: Self-pay

## 2023-11-01 ENCOUNTER — Ambulatory Visit (HOSPITAL_COMMUNITY): Admitting: Mental Health

## 2023-11-01 ENCOUNTER — Encounter (HOSPITAL_COMMUNITY): Payer: Self-pay

## 2023-11-01 ENCOUNTER — Telehealth (HOSPITAL_COMMUNITY): Payer: Self-pay | Admitting: Mental Health

## 2023-11-01 NOTE — Telephone Encounter (Signed)
 Therapist sent link for tele-therapy session x 2. No response. Contacted number on file, no answer. Left HIPAA compliant voicemail. NS

## 2023-11-04 ENCOUNTER — Other Ambulatory Visit (HOSPITAL_COMMUNITY): Payer: Self-pay

## 2023-11-06 ENCOUNTER — Telehealth (HOSPITAL_COMMUNITY): Payer: Self-pay | Admitting: Mental Health
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
## 2023-11-14 ENCOUNTER — Other Ambulatory Visit (HOSPITAL_COMMUNITY): Payer: Self-pay

## 2023-11-14 ENCOUNTER — Ambulatory Visit (INDEPENDENT_AMBULATORY_CARE_PROVIDER_SITE_OTHER): Admitting: Psychiatry

## 2023-11-14 VITALS — BP 95/70 | Temp 98.0°F

## 2023-11-14 DIAGNOSIS — F411 Generalized anxiety disorder: Secondary | ICD-10-CM

## 2023-11-14 DIAGNOSIS — F331 Major depressive disorder, recurrent, moderate: Secondary | ICD-10-CM

## 2023-11-14 MED ORDER — SERTRALINE HCL 100 MG PO TABS
150.0000 mg | ORAL_TABLET | Freq: Every day | ORAL | 1 refills | Status: DC
  Filled 2023-11-14 – 2023-11-18 (×2): qty 45, 30d supply, fill #0
  Filled 2023-12-21: qty 45, 30d supply, fill #1

## 2023-11-14 MED ORDER — MIRTAZAPINE 30 MG PO TABS
30.0000 mg | ORAL_TABLET | Freq: Every day | ORAL | 1 refills | Status: DC
Start: 2023-11-14 — End: 2023-12-27
  Filled 2023-11-14: qty 30, 30d supply, fill #0
  Filled 2023-12-09: qty 30, 30d supply, fill #1

## 2023-11-14 MED ORDER — HYDROXYZINE HCL 25 MG PO TABS
25.0000 mg | ORAL_TABLET | Freq: Two times a day (BID) | ORAL | 1 refills | Status: DC | PRN
  Filled 2023-11-14 – 2023-11-29 (×2): qty 60, 30d supply, fill #0
  Filled 2023-12-26: qty 60, 30d supply, fill #1

## 2023-11-18 ENCOUNTER — Other Ambulatory Visit (HOSPITAL_COMMUNITY): Payer: Self-pay

## 2023-11-21 ENCOUNTER — Other Ambulatory Visit (INDEPENDENT_AMBULATORY_CARE_PROVIDER_SITE_OTHER): Payer: Self-pay

## 2023-11-21 ENCOUNTER — Other Ambulatory Visit (HOSPITAL_COMMUNITY): Payer: Self-pay

## 2023-11-21 ENCOUNTER — Ambulatory Visit (INDEPENDENT_AMBULATORY_CARE_PROVIDER_SITE_OTHER): Payer: Self-pay | Admitting: Orthopaedic Surgery

## 2023-11-21 ENCOUNTER — Telehealth: Payer: Self-pay

## 2023-11-21 DIAGNOSIS — M1712 Unilateral primary osteoarthritis, left knee: Secondary | ICD-10-CM

## 2023-11-21 DIAGNOSIS — M2242 Chondromalacia patellae, left knee: Secondary | ICD-10-CM | POA: Insufficient documentation

## 2023-11-21 NOTE — Telephone Encounter (Signed)
 Please precert for left knee visco injection. Dr.Xu's patient. Thanks!

## 2023-11-21 NOTE — Progress Notes (Signed)
 Office Visit Note   Patient: Patty Stanley           Date of Birth: 1981-03-25           MRN: [id number] Visit Date: 11/21/2023              Requested by: Katina Pfeiffer, PA-C [address] [place],  [place] [address] PCP: Gwenn Norris, MD (Inactive)   Assessment & Plan: Visit Diagnoses:  1. Primary osteoarthritis of left knee   2. Chondromalacia of left patella     Plan: History of Present Illness Patty Stanley is a 43 year old female who presents with left knee pain. She is accompanied by her husband.  She has experienced left knee pain for two to three months, worsening over time. The pain is intermittent, primarily under the patella and along the sides of the knee, with a sensation of instability and occasional swelling. She reports the knee feeling like it might buckle and occasional popping.  She completed physical therapy last month with mixed results. She uses a knee brace and medications such as Tylenol  and naproxen , but symptoms persist. Different types of knee braces have not provided significant relief.  There is no history of previous injuries, surgeries, or injections to the knee, except for a slip in 2001. She walks almost two miles daily and has lost significant weight in the past. No current joint line tenderness is noted, though it was present over the past two to three months.  Physical Exam MUSCULOSKELETAL: Left knee with good flexibility and patella tracking well. No joint line tenderness. Mild swelling present.  Results RADIOLOGY Left knee X-ray: Joint well preserved, no fractures, no dislocations, no congenital abnormalities (11/21/2023)  Assessment and Plan Early arthritis and chondromalacia of left knee Intermittent left knee pain with occasional swelling and instability. X-rays indicate early arthritis behind the patella. Nonsurgical at this stage. Previous physical therapy provided some relief. Weight loss emphasized to reduce knee  pressure. Cortisone injections not recommended. Gel injections considered for joint lubrication. - Continue home exercises to strengthen knee muscles. - Encourage weight loss to reduce knee pressure. - Provide knee brace for support. - Consider gel injections if symptoms persist.  This patient is diagnosed with osteoarthritis of the knee(s).    Radiographs show evidence of joint space narrowing, osteophytes, subchondral sclerosis and/or subchondral cysts.  This patient has knee pain which interferes with functional and activities of daily living.    This patient has experienced inadequate response, adverse effects and/or intolerance with conservative treatments such as acetaminophen , NSAIDS, topical creams, physical therapy or regular exercise, knee bracing and/or weight loss.   This patient has experienced inadequate response or has a contraindication to intra articular steroid injections for at least 3 months.   This patient is not scheduled to have a total knee replacement within 6 months of starting treatment with viscosupplementation.  Follow-Up Instructions: No follow-ups on file.   Orders:  Orders Placed This Encounter  Procedures   XR KNEE 3 VIEW LEFT   No orders of the defined types were placed in this encounter.     Procedures: No procedures performed   Clinical Data: No additional findings.   Subjective: Chief Complaint  Patient presents with   Left Knee - Pain    HPI  Review of Systems  Constitutional: Negative.   HENT: Negative.    Eyes: Negative.   Respiratory: Negative.    Cardiovascular: Negative.   Endocrine: Negative.   Musculoskeletal: Negative.   Neurological:  Negative.   Hematological: Negative.   Psychiatric/Behavioral: Negative.    All other systems reviewed and are negative.    Objective: Vital Signs: There were no vitals taken for this visit.  Physical Exam Vitals and nursing note reviewed.  Constitutional:      Appearance: She  is well-developed.  HENT:     Head: Atraumatic.     Nose: Nose normal.  Eyes:     Extraocular Movements: Extraocular movements intact.  Cardiovascular:     Pulses: Normal pulses.  Pulmonary:     Effort: Pulmonary effort is normal.  Abdominal:     Palpations: Abdomen is soft.  Musculoskeletal:     Cervical back: Neck supple.  Skin:    General: Skin is warm.     Capillary Refill: Capillary refill takes less than 2 seconds.  Neurological:     Mental Status: She is alert. Mental status is at baseline.  Psychiatric:        Behavior: Behavior normal.        Thought Content: Thought content normal.        Judgment: Judgment normal.     Ortho Exam  Specialty Comments:  No specialty comments available.  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   Moderate recurrent major depression (HCC) 02/24/2023   MDD (major depressive disorder) 01/04/2023   Alcohol use disorder, severe, in sustained remission (HCC) 01/04/2023   GAD (generalized anxiety disorder) 09/01/2021   Attention and concentration deficit 02/12/2021   Insomnia secondary to anxiety 02/12/2021   Unspecified intellectual disabilities 12/24/2019   RLS (restless legs syndrome) 11/29/2012   Routine general medical examination at a health care facility 11/29/2012   Obesity (BMI 30.0-34.9) 11/29/2012   Need for pneumococcal vaccination 11/29/2012   GERD (gastroesophageal reflux disease) 10/14/2011   Low back pain 01/27/2011   Allergic rhinitis 04/19/2010   Asthma, intermittent 04/19/2010   Osteoarthritis 04/19/2010   Past Medical History:  Diagnosis Date   Alcohol abuse    ALLERGIC RHINITIS    Anxiety    ASTHMA    DEPRESSION    Headache(784.0)     OSTEOARTHRITIS    UNSPECIFIED TACHYCARDIA     Family History  Adopted: Yes  Problem Relation Age of Onset   Alcohol abuse Other    Arthritis Other     Past Surgical History:  Procedure Laterality Date   WISDOM TOOTH EXTRACTION     Social History   Occupational History   Occupation: Media planner: [employer]  Tobacco Use   Smoking status: Never   Smokeless tobacco: Never  Substance and Sexual Activity   Alcohol use: No   Drug use: No   Sexual activity: Yes    Birth control/protection: Pill

## 2023-11-24 NOTE — Telephone Encounter (Signed)
 VOB submitted for Monovisc, left knee

## 2023-11-29 ENCOUNTER — Other Ambulatory Visit (HOSPITAL_COMMUNITY): Payer: Self-pay

## 2023-12-11 ENCOUNTER — Other Ambulatory Visit (HOSPITAL_COMMUNITY): Payer: Self-pay

## 2023-12-12 ENCOUNTER — Other Ambulatory Visit: Payer: Self-pay

## 2023-12-12 DIAGNOSIS — M1712 Unilateral primary osteoarthritis, left knee: Secondary | ICD-10-CM

## 2023-12-18 ENCOUNTER — Other Ambulatory Visit (HOSPITAL_COMMUNITY): Payer: Self-pay

## 2023-12-19 ENCOUNTER — Ambulatory Visit (HOSPITAL_COMMUNITY): Admitting: Psychiatry

## 2023-12-19 ENCOUNTER — Encounter (HOSPITAL_COMMUNITY): Payer: Self-pay
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
## 2023-12-22 ENCOUNTER — Ambulatory Visit: Admitting: Orthopaedic Surgery

## 2023-12-26 ENCOUNTER — Other Ambulatory Visit (HOSPITAL_COMMUNITY): Payer: Self-pay

## 2023-12-26 ENCOUNTER — Telehealth (HOSPITAL_COMMUNITY): Payer: Self-pay

## 2023-12-26 NOTE — Telephone Encounter (Signed)
 Hello,   Pt is requesting for a bridge of medications until her next appointment . Pt states that she only has 1 pill left.   JNL

## 2023-12-27 ENCOUNTER — Other Ambulatory Visit (HOSPITAL_COMMUNITY): Payer: Self-pay | Admitting: Psychiatry

## 2023-12-27 ENCOUNTER — Other Ambulatory Visit (HOSPITAL_COMMUNITY): Payer: Self-pay

## 2023-12-27 DIAGNOSIS — F411 Generalized anxiety disorder: Secondary | ICD-10-CM

## 2023-12-27 DIAGNOSIS — F331 Major depressive disorder, recurrent, moderate: Secondary | ICD-10-CM

## 2023-12-27 MED ORDER — SERTRALINE HCL 100 MG PO TABS
150.0000 mg | ORAL_TABLET | Freq: Every day | ORAL | 0 refills | Status: DC
  Filled 2023-12-27 (×4): qty 23, 15d supply, fill #0
  Filled ????-??-?? (×2): fill #0

## 2023-12-27 MED ORDER — MIRTAZAPINE 30 MG PO TABS
30.0000 mg | ORAL_TABLET | Freq: Every day | ORAL | 0 refills | Status: DC
  Filled 2023-12-27 (×4): qty 15, 15d supply, fill #0
  Filled ????-??-??: fill #0

## 2023-12-28 ENCOUNTER — Other Ambulatory Visit (HOSPITAL_COMMUNITY): Payer: Self-pay

## 2023-12-28 ENCOUNTER — Ambulatory Visit (INDEPENDENT_AMBULATORY_CARE_PROVIDER_SITE_OTHER): Admitting: Psychiatry

## 2023-12-28 VITALS — BP 113/70 | Wt 174.6 lb

## 2023-12-28 DIAGNOSIS — F331 Major depressive disorder, recurrent, moderate: Secondary | ICD-10-CM

## 2023-12-28 DIAGNOSIS — F411 Generalized anxiety disorder: Secondary | ICD-10-CM

## 2023-12-28 MED ORDER — HYDROXYZINE HCL 25 MG PO TABS
25.0000 mg | ORAL_TABLET | Freq: Three times a day (TID) | ORAL | 1 refills | Status: DC | PRN
  Filled 2023-12-28 – 2024-01-08 (×3): qty 60, 20d supply, fill #0

## 2023-12-28 MED ORDER — MIRTAZAPINE 30 MG PO TABS
30.0000 mg | ORAL_TABLET | Freq: Every day | ORAL | 1 refills | Status: DC
  Filled 2023-12-28 – 2024-01-08 (×2): qty 30, 30d supply, fill #0
  Filled 2024-02-07: qty 30, 30d supply, fill #1

## 2023-12-28 MED ORDER — SERTRALINE HCL 100 MG PO TABS
150.0000 mg | ORAL_TABLET | Freq: Every day | ORAL | 1 refills | Status: DC
  Filled 2023-12-28 – 2024-01-17 (×2): qty 45, 30d supply, fill #0
  Filled 2024-02-12: qty 45, 30d supply, fill #1

## 2023-12-28 NOTE — Patient Instructions (Signed)
 Thank you for attending your appointment today.  -- Continue medications as prescribed.  - next appointment is on August 28 at 8 AM for therapy - medication appointment for October 2 at 1 PM  Please do not make any changes to medications without first discussing with your provider. If you are experiencing a psychiatric emergency, please call 911 or present to your nearest emergency department. Additional crisis, medication management, and therapy resources are included below.  [facility]  [address] [place], [place] [address] [phone] WALK-IN URGENT CARE 24/7 FOR ANYONE [address] [place], [place]  [phone] Fax: [phone] [email or web address] *Interpreters available *Accepts all insurance and uninsured for Urgent Care needs *Accepts Medicaid and uninsured for outpatient treatment (below)      ONLY FOR [facility]  Below:    Outpatient New Patient Assessment/Therapy Walk-ins:        Monday, Wednesday, and Thursday 8am until slots are full (first come, first served)                   New Patient Psychiatry/Medication Management        Monday-Friday 8am-11am (first come, first served)               For all walk-ins we ask that you arrive by 7:15am, because patients will be seen in the order of arrival.

## 2024-01-04 ENCOUNTER — Other Ambulatory Visit (HOSPITAL_COMMUNITY): Payer: Self-pay

## 2024-01-08 ENCOUNTER — Other Ambulatory Visit (HOSPITAL_COMMUNITY): Payer: Self-pay

## 2024-01-11 ENCOUNTER — Ambulatory Visit (INDEPENDENT_AMBULATORY_CARE_PROVIDER_SITE_OTHER): Admitting: Psychiatry

## 2024-01-11 DIAGNOSIS — F331 Major depressive disorder, recurrent, moderate: Secondary | ICD-10-CM
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
## 2024-01-17 ENCOUNTER — Other Ambulatory Visit (HOSPITAL_COMMUNITY): Payer: Self-pay

## 2024-01-19 ENCOUNTER — Other Ambulatory Visit (HOSPITAL_COMMUNITY): Payer: Self-pay

## 2024-01-19 MED ORDER — AZITHROMYCIN 250 MG PO TABS
ORAL_TABLET | ORAL | 0 refills | Status: DC
  Filled 2024-01-19: qty 6, 5d supply, fill #0

## 2024-01-19 MED ORDER — PREDNISONE 20 MG PO TABS
ORAL_TABLET | ORAL | 0 refills | Status: DC
  Filled 2024-01-19: qty 6, 4d supply, fill #0

## 2024-01-25 ENCOUNTER — Ambulatory Visit (INDEPENDENT_AMBULATORY_CARE_PROVIDER_SITE_OTHER): Admitting: Psychiatry

## 2024-01-25 DIAGNOSIS — F411 Generalized anxiety disorder: Secondary | ICD-10-CM

## 2024-01-25 DIAGNOSIS — F331 Major depressive disorder, recurrent, moderate: Secondary | ICD-10-CM | POA: Diagnosis not present

## 2024-01-25 NOTE — Progress Notes (Signed)
 [facility] [address] [place] [address] Dept: [phone] Dept Fax: [phone]  Psychotherapy Progress Note  Patient ID: Darianny Momon, female  DOB: [birth year], 43 y.o.  MRN: [id number]  01/25/2024 Start time: 7:50 AM End time: 8:45 AM  Method of Visit: Face-to-Face  Present: patient  Current Concerns: anxiety, anger  Current Symptoms: Anxiety  Psychiatric Specialty Exam: General Appearance: appears at stated age, casually dressed and groomed   Behavior: pleasant and cooperative   Psychomotor Activity: no psychomotor agitation or retardation noted   Eye Contact: fair  Speech: normal amount, volume and fluency    Mood: euthymic  Affect: congruent, pleasant and interactive   Thought Process: linear, goal directed, no circumstantial or tangential thought process noted, no racing thoughts or flight of ideas  Descriptions of Associations: intact   Thought Content Hallucinations: denies AH, VH , does not appear responding to stimuli  Delusions: no paranoia, delusions of control, grandeur, ideas of reference, thought broadcasting, and magical thinking  Suicidal Thoughts: denies SI, intention, plan  Homicidal Thoughts: denies HI, intention, plan   Alertness/Orientation: alert and fully oriented   Insight: limited Judgment: fair  Memory: intact   Executive Functions  Concentration: intact  Attention Span: fair  Recall: intact  Fund of Knowledge: fair   Diagnosis: MDD  Anticipated Frequency of Visits: every other week Anticipated Length of Treatment Episode: 6 months  Short Term Goals/Goals for Treatment Session:  - discussing cognitive distortions and foundation of CBT Discussed that foundations of cognitive behavioral therapy in terms of how a situation can cause a thought which causes the feeling which causes the behavior which ultimately leads to an outcome.  Discussed how feelings are  difficult to change on its own but we are able to alter thoughts and behaviors more easily.  Discussed the concept of thinking traps or ANTs (automatic negative thoughts) and how they can be categorized in different types of cognitive distortions.  Discussed that patient's main thinking At this time is labeling and discounting the positive.  Discussed the concept of catch, check, change.  Discussed how this can help catch the thought and analyze evidence for and against the thought which then leads into brainstorming more adaptive thought.  Patient had difficulty categorizing the type of thinking she tends to have but with guidance we were able to discuss.  I encouraged her to brainstorm on more adaptive thoughts.  Progress Towards Goals: Progressing as evidenced to continued engagement in the session  Treatment Intervention: Cognitive Behavioral therapy  Medical Necessity: Improved patient condition  Assessment Tools:    09/06/2023    3:30 PM 06/07/2023    3:22 PM 02/22/2023   11:17 AM  Depression screen PHQ 2/9  Decreased Interest 0 2 0  Down, Depressed, Hopeless 0 2 1  PHQ - 2 Score 0 4 1  Altered sleeping 2 3 1   Tired, decreased energy 2 2 1   Change in appetite 0 2 2  Feeling bad or failure about yourself  1 2 1   Trouble concentrating 1 2 3   Moving slowly or fidgety/restless 0 3 2  Suicidal thoughts 0 0 0  PHQ-9 Score 6 18 11   Difficult doing work/chores Not difficult at all  Somewhat difficult   Failed to redirect to the Timeline version of the REVFS SmartLink. Flowsheet Row Counselor from 02/22/2023 in [facility] Office Visit from 02/10/2022 in [facility] Office Visit from 01/19/2022 in [facility]  C-SSRS RISK CATEGORY No Risk Low Risk Low Risk    Patient/Guardian was advised Release of Information must be obtained prior to any record release in order to collaborate their care with an outside  provider. Patient/Guardian was advised if they have not already done so to contact the registration department to sign all necessary forms in order for us  to release information regarding their care.   Consent: Patient/Guardian gives verbal consent for treatment and assignment of benefits for services provided during this visit. Patient/Guardian expressed understanding and agreed to proceed.   Plan: Follow-up in 1 month, exercising the catch check change model  Ismael KATHEE Franco, MD 01/25/2024
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
## 2024-02-15 ENCOUNTER — Other Ambulatory Visit (HOSPITAL_COMMUNITY): Payer: Self-pay

## 2024-02-15 ENCOUNTER — Ambulatory Visit (INDEPENDENT_AMBULATORY_CARE_PROVIDER_SITE_OTHER): Admitting: Psychiatry

## 2024-02-15 VITALS — BP 113/76 | Wt 173.0 lb

## 2024-02-15 DIAGNOSIS — F411 Generalized anxiety disorder: Secondary | ICD-10-CM

## 2024-02-15 DIAGNOSIS — F331 Major depressive disorder, recurrent, moderate: Secondary | ICD-10-CM

## 2024-02-15 MED ORDER — SERTRALINE HCL 100 MG PO TABS
150.0000 mg | ORAL_TABLET | Freq: Every day | ORAL | 1 refills | Status: DC
  Filled 2024-03-13: qty 45, 30d supply, fill #0
  Filled 2024-04-11: qty 45, 30d supply, fill #1

## 2024-02-15 MED ORDER — MIRTAZAPINE 30 MG PO TABS
15.0000 mg | ORAL_TABLET | Freq: Every day | ORAL | 0 refills | Status: DC
  Filled 2024-02-15 – 2024-02-29 (×15): qty 30, 60d supply, fill #0

## 2024-02-15 MED ORDER — HYDROXYZINE HCL 10 MG PO TABS
10.0000 mg | ORAL_TABLET | Freq: Three times a day (TID) | ORAL | 0 refills | Status: DC | PRN
  Filled 2024-02-15: qty 30, 10d supply, fill #0

## 2024-02-16 ENCOUNTER — Other Ambulatory Visit (HOSPITAL_COMMUNITY): Payer: Self-pay

## 2024-02-16 ENCOUNTER — Other Ambulatory Visit: Payer: Self-pay

## 2024-02-17 ENCOUNTER — Other Ambulatory Visit (HOSPITAL_COMMUNITY): Payer: Self-pay

## 2024-02-18 ENCOUNTER — Other Ambulatory Visit (HOSPITAL_COMMUNITY): Payer: Self-pay

## 2024-02-19 ENCOUNTER — Other Ambulatory Visit (HOSPITAL_COMMUNITY): Payer: Self-pay

## 2024-02-20 ENCOUNTER — Other Ambulatory Visit (HOSPITAL_COMMUNITY): Payer: Self-pay

## 2024-02-20 MED ORDER — NEBIVOLOL HCL 5 MG PO TABS
5.0000 mg | ORAL_TABLET | Freq: Every day | ORAL | 2 refills | Status: AC
  Filled 2024-02-20: qty 90, 90d supply, fill #0
  Filled 2024-03-13 – 2024-05-15 (×3): qty 90, 90d supply, fill #1

## 2024-02-21 ENCOUNTER — Other Ambulatory Visit (HOSPITAL_COMMUNITY): Payer: Self-pay

## 2024-02-22 ENCOUNTER — Other Ambulatory Visit (HOSPITAL_COMMUNITY): Payer: Self-pay

## 2024-02-23 ENCOUNTER — Other Ambulatory Visit (HOSPITAL_COMMUNITY): Payer: Self-pay

## 2024-02-24 ENCOUNTER — Other Ambulatory Visit (HOSPITAL_COMMUNITY): Payer: Self-pay

## 2024-02-25 ENCOUNTER — Other Ambulatory Visit (HOSPITAL_COMMUNITY): Payer: Self-pay

## 2024-02-26 ENCOUNTER — Other Ambulatory Visit (HOSPITAL_COMMUNITY): Payer: Self-pay

## 2024-02-27 ENCOUNTER — Other Ambulatory Visit (HOSPITAL_COMMUNITY): Payer: Self-pay

## 2024-02-28 ENCOUNTER — Other Ambulatory Visit (HOSPITAL_COMMUNITY): Payer: Self-pay

## 2024-02-29 ENCOUNTER — Other Ambulatory Visit (HOSPITAL_COMMUNITY): Payer: Self-pay

## 2024-02-29 ENCOUNTER — Telehealth (HOSPITAL_COMMUNITY): Payer: Self-pay

## 2024-02-29 ENCOUNTER — Other Ambulatory Visit (HOSPITAL_COMMUNITY): Payer: Self-pay | Admitting: Psychiatry

## 2024-02-29 ENCOUNTER — Other Ambulatory Visit: Payer: Self-pay

## 2024-02-29 MED ORDER — HYDROXYZINE HCL 10 MG PO TABS
10.0000 mg | ORAL_TABLET | Freq: Three times a day (TID) | ORAL | 0 refills | Status: DC | PRN
  Filled 2024-02-29: qty 90, 30d supply, fill #0

## 2024-02-29 NOTE — Progress Notes (Signed)
 Received refill request from pharmacy for this patient's hydroxyzine . I sent in the medication to bridge patient for the next appointment on 12/11.  Ismael Franco, MD PGY-3 Psychiatry Resident

## 2024-02-29 NOTE — Telephone Encounter (Signed)
 called pharmacy and they state that they did not have a rx for the hydroxyzine . please send a new rx

## 2024-02-29 NOTE — Telephone Encounter (Signed)
 pt called states that she needed a refill on the hydroxyzine .

## 2024-03-05 ENCOUNTER — Other Ambulatory Visit: Payer: Self-pay

## 2024-03-05 ENCOUNTER — Ambulatory Visit: Payer: Self-pay | Admitting: Allergy and Immunology

## 2024-03-05 ENCOUNTER — Encounter: Payer: Self-pay | Admitting: Allergy and Immunology

## 2024-03-05 ENCOUNTER — Other Ambulatory Visit (HOSPITAL_COMMUNITY): Payer: Self-pay

## 2024-03-05 VITALS — BP 114/68 | HR 78 | Ht 59.0 in | Wt 173.0 lb

## 2024-03-05 DIAGNOSIS — J3089 Other allergic rhinitis: Secondary | ICD-10-CM | POA: Diagnosis not present

## 2024-03-05 DIAGNOSIS — J4530 Mild persistent asthma, uncomplicated: Secondary | ICD-10-CM | POA: Diagnosis not present

## 2024-03-05 DIAGNOSIS — J4520 Mild intermittent asthma, uncomplicated: Secondary | ICD-10-CM

## 2024-03-05 DIAGNOSIS — K219 Gastro-esophageal reflux disease without esophagitis: Secondary | ICD-10-CM | POA: Diagnosis not present

## 2024-03-05 DIAGNOSIS — J309 Allergic rhinitis, unspecified: Secondary | ICD-10-CM

## 2024-03-05 DIAGNOSIS — J301 Allergic rhinitis due to pollen: Secondary | ICD-10-CM

## 2024-03-05 DIAGNOSIS — K224 Dyskinesia of esophagus: Secondary | ICD-10-CM

## 2024-03-05 MED ORDER — ARNUITY ELLIPTA 100 MCG/ACT IN AEPB
1.0000 | INHALATION_SPRAY | Freq: Every day | RESPIRATORY_TRACT | 12 refills | Status: AC
  Filled 2024-03-05: qty 30, 30d supply, fill #0

## 2024-03-05 MED ORDER — OMEPRAZOLE 40 MG PO CPDR
40.0000 mg | DELAYED_RELEASE_CAPSULE | Freq: Every day | ORAL | 5 refills | Status: DC
  Filled 2024-03-05: qty 60, 60d supply, fill #0
  Filled 2024-03-13 – 2024-03-27 (×2): qty 60, 60d supply, fill #1
  Filled 2024-04-03: qty 30, 30d supply, fill #1

## 2024-03-05 MED ORDER — FLUTICASONE PROPIONATE 50 MCG/ACT NA SUSP
2.0000 | Freq: Every day | NASAL | 5 refills | Status: AC
  Filled 2024-03-05: qty 16, 30d supply, fill #0
  Filled 2024-03-13 – 2024-03-27 (×2): qty 16, 30d supply, fill #1
  Filled 2024-04-27: qty 16, 30d supply, fill #2
  Filled 2024-05-27: qty 16, 30d supply, fill #3

## 2024-03-05 MED ORDER — CETIRIZINE HCL 10 MG PO TABS
10.0000 mg | ORAL_TABLET | Freq: Every day | ORAL | 5 refills | Status: AC
  Filled 2024-03-05: qty 30, 30d supply, fill #0
  Filled 2024-03-13 – 2024-03-27 (×2): qty 30, 30d supply, fill #1
  Filled 2024-05-08: qty 90, 90d supply, fill #2

## 2024-03-05 NOTE — Progress Notes (Unsigned)
 Patty Stanley - [place] - [place] - [place] - [place]   Dear Patty Stanley,  Thank you for referring Patty Stanley to the [facility] [place]  on 03/05/2024.   Below is a summation of this patient's evaluation and recommendations.  Thank you for your referral. I will keep you informed about this patient's response to treatment.   If you have any questions please do not hesitate to contact me.   Sincerely,  Patty DOROTHA Denis, MD Allergy / Immunology [facility] [place] [facility]    ______________________________________________________________________    NEW PATIENT NOTE  Referring Provider: Katina Pfeiffer, PA-C Primary Provider: Gwenn Norris, MD Date of office visit: 03/05/2024    Subjective:   Chief Complaint:  Patty Stanley (DOB: July 16, 1980) is a 43 y.o. female who presents to the clinic on 03/05/2024 with a chief complaint of New Patient (Initial Visit) (Mild asthma, allergic certain food-when eating chips start coughing 1-2 months) .     HPI: Patty Stanley presents to this clinic in evaluation of respiratory issues.  It appears that she has a long history of allergic rhinoconjunctivitis with nasal congestion and sneezing and itchy nose and eyes that appears to flare during the spring especially following exposure to outdoors and dust and cats that has become quite significant the past year especially this fall.  None of the medication she takes for this issue helps her very much.  She has no associated ugly nasal discharge or anosmia or headaches.  She also has asthma.  She is coughing and hacking and wheezing.  A lot of her lower respiratory tract symptoms, spells with posttussive emesis.  She has been given albuterol  in the past and she is not sure it helps very much.  She has been given Combivent in the past and this may help her somewhat.  She has a long history of reflux.  She complains of having  throat clearing and a lump stuck in her throat and intermittent raspy voice.  She is a very easy gag reflex and can vomit.  She has some problems getting food stuck in her chest.  She underwent an esophageal dilation in 2023 but she does not know the esophageal biopsy data from that procedure.  She is taking omeprazole  once a day it still has regurgitation up to her throat.  She drinks at least 8 cans of caffeinated soda per day.  Apparently when she eats Pringles for potato chips or certain crackers irritates her tongue and can make her cough.  Past Medical History:  Diagnosis Date  . Alcohol abuse   . ALLERGIC RHINITIS   . Anxiety   . ASTHMA   . DEPRESSION   . Headache(784.0)   . OSTEOARTHRITIS   . UNSPECIFIED TACHYCARDIA     Past Surgical History:  Procedure Laterality Date  . WISDOM TOOTH EXTRACTION      Allergies as of 03/05/2024       Reactions   Penicillins Hives   REACTION: Itching   Garlic    Other Reaction(s): excessive coughing   Methocarbamol  Itching        Medication List    albuterol  108 (90 Base) MCG/ACT inhaler Commonly known as: VENTOLIN  HFA Inhale 1 puff by mouth into the lungs every 8 hours as needed   cetirizine 10 MG tablet Commonly known as: ZYRTEC Take 10 mg by mouth daily.   ferrous sulfate  325 (65 FE) MG tablet Take 1 tablet (325 mg total) by mouth every other  day.   hydrOXYzine  25 MG tablet Commonly known as: ATARAX  Take 25 mg by mouth 3 (three) times daily as needed.   hydrOXYzine  10 MG tablet Commonly known as: ATARAX  Take 1 tablet (10 mg total) by mouth 3 (three) times daily as needed.   mirtazapine  30 MG tablet Commonly known as: Remeron  Take 0.5 tablets (15 mg total) by mouth at bedtime.   montelukast  10 MG tablet Commonly known as: SINGULAIR  Take 1 tablet (10 mg total) by mouth daily. What changed: Another medication with the same name was removed. Continue taking this medication, and follow the directions you see  here. Changed by: Xue Low J Waylen Depaolo   nebivolol  5 MG tablet Commonly known as: BYSTOLIC  TAKE 1 TABLET BY MOUTH ONCE A DAY What changed: Another medication with the same name was removed. Continue taking this medication, and follow the directions you see here. Changed by: Shareece Bultman J Revan Gendron   nebivolol  5 MG tablet Commonly known as: BYSTOLIC  Take 1 tablet (5 mg total) by mouth daily. What changed: Another medication with the same name was removed. Continue taking this medication, and follow the directions you see here. Changed by: Lyrick Lagrand J Erza Mothershead   omeprazole  20 MG capsule Commonly known as: PRILOSEC Take 1 capsule (20 mg total) by mouth daily 30 minutes before morning meal What changed: Another medication with the same name was removed. Continue taking this medication, and follow the directions you see here. Changed by: Genine Beckett J Antwoine Zorn   sertraline  100 MG tablet Commonly known as: ZOLOFT  Take 1.5 tablets (150 mg total) by mouth daily.   VyLibra  0.25-35 MG-MCG tablet Generic drug: norgestimate -ethinyl estradiol  TAKE 1 TABLET BY MOUTH ONCE DAILY CONTINUOUSLY AS DIRECTED   VyLibra  0.25-35 MG-MCG tablet Generic drug: norgestimate -ethinyl estradiol  Take 1 tablet by mouth once daily as directed-ok continuous dosing    Review of systems negative except as noted in HPI / PMHx or noted below:  Review of Systems  Constitutional: Negative.   HENT: Negative.    Eyes: Negative.   Respiratory: Negative.    Cardiovascular: Negative.   Gastrointestinal: Negative.   Genitourinary: Negative.   Musculoskeletal: Negative.   Skin: Negative.   Neurological: Negative.   Endo/Heme/Allergies: Negative.   Psychiatric/Behavioral: Negative.      Family History  Adopted: Yes  Problem Relation Age of Onset  . Alcohol abuse Other   . Arthritis Other     Social History   Socioeconomic History  . Marital status: Married    Spouse name: Not on file  . Number of children: Not on file  . Years of  education: Not on file  . Highest education level: Not on file  Occupational History  . Occupation: Media planner: [employer]  Tobacco Use  . Smoking status: Never  . Smokeless tobacco: Never  Substance and Sexual Activity  . Alcohol use: No  . Drug use: No  . Sexual activity: Yes    Birth control/protection: Pill  Other Topics Concern  . Not on file  Social History Narrative  . Not on file   Social Drivers of Health   Financial Resource Strain: Low Risk  (02/22/2023)   Overall Financial Resource Strain (CARDIA)   . Difficulty of Paying Living Expenses: Not hard at all  Food Insecurity: No Food Insecurity (02/22/2023)   Hunger Vital Sign   . Worried About Programme researcher, broadcasting/film/video in the Last Year: Never true   . Ran Out of Food in the Last Year: Never true  Transportation Needs: No Transportation Needs (02/22/2023)   PRAPARE - Transportation   . Lack of Transportation (Medical): No   . Lack of Transportation (Non-Medical): No  Physical Activity: Inactive (02/22/2023)   Exercise Vital Sign   . Days of Exercise per Week: 0 days   . Minutes of Exercise per Session: 0 min  Stress: Stress Concern Present (02/22/2023)   [employer] of Occupational Health - Occupational Stress Questionnaire   . Feeling of Stress : To some extent  Social Connections: Moderately Isolated (02/22/2023)   Social Connection and Isolation Panel   . Frequency of Communication with Friends and Family: Three times a week   . Frequency of Social Gatherings with Friends and Family: Once a week   . Attends Religious Services: Never   . Active Member of Clubs or Organizations: No   . Attends Banker Meetings: Never   . Marital Status: Married  Catering manager Violence: Not At Risk (02/22/2023)   Humiliation, Afraid, Rape, and Kick questionnaire   . Fear of Current or Ex-Partner: No   . Emotionally Abused: No   . Physically Abused: No   . Sexually Abused: No    Environmental and  Social history  Lives in a house with a dry environment, cat located inside the household, no carpet in the bedroom, no plastic in the bed, no plastic on the pillow, and no smoking ongoing's inside household.  Objective:   Vitals:   03/05/24 0944  BP: 114/68  Pulse: 78  SpO2: 98%   Height: 4' 11 (149.9 cm) Weight: 173 lb (78.5 kg)  Physical Exam Constitutional:      Appearance: She is not diaphoretic.     Comments: Raspy voice, allergic shiners  HENT:     Head: Normocephalic.     Right Ear: Tympanic membrane, ear canal and external ear normal.     Left Ear: Tympanic membrane, ear canal and external ear normal.     Nose: Nose normal. No mucosal edema or rhinorrhea.     Mouth/Throat:     Pharynx: Uvula midline. No oropharyngeal exudate.  Eyes:     Conjunctiva/sclera: Conjunctivae normal.  Neck:     Thyroid: No thyromegaly.     Trachea: Trachea normal. No tracheal tenderness or tracheal deviation.  Cardiovascular:     Rate and Rhythm: Normal rate and regular rhythm.     Heart sounds: Normal heart sounds, S1 normal and S2 normal. No murmur heard. Pulmonary:     Effort: No respiratory distress.     Breath sounds: Normal breath sounds. No stridor. No wheezing or rales.  Lymphadenopathy:     Head:     Right side of head: No tonsillar adenopathy.     Left side of head: No tonsillar adenopathy.     Cervical: No cervical adenopathy.  Skin:    Findings: No erythema or rash.     Nails: There is no clubbing.  Neurological:     Mental Status: She is alert.     Diagnostics: Allergy skin tests were not performed.   Spirometry was performed and demonstrated an FEV1 of 2.43 @ 158 % of predicted. FEV1/FVC = 0.84    Assessment and Plan:    No diagnosis found.  Patient Instructions   1. Return to clinic for skin testing (no antihistamines)  2. Treat and prevent inflammation of airway:   A. Fluticasone 44 - 2 inhalations 2 times per day (empty lungs)  B. Fluticasone - 1  spray each nostril 2  times per day  3. Treat and prevent reflux induced inflammation:   A. Slowly taper caffeine to one per day  B. Replace all throat clearing with drinking / swallowing maneuver  C. Increase Omeprazole  40 mg - 1 tablet 2 times per day  4. If needed:   A. Albuterol +Fluticasone 44 - 2 inhalations TOGETHER every 4-6 hours  B. Cetirizine 10 mg - 1 tablet 1 time per day  5. Influenza = Tamiflu. Covid = Paxlovid  6. Collect esophageal biopsy results from previous upper endoscopy      Patty DOROTHA Denis, MD Allergy / Immunology [place] [facility] [place] 

## 2024-03-05 NOTE — Patient Instructions (Addendum)
  1. Return to clinic for skin testing (no antihistamines)  2. Treat and prevent inflammation of airway:   A. Fluticasone 44 - 2 inhalations 2 times per day (empty lungs)  B. Fluticasone - 1 spray each nostril 2 times per day  3. Treat and prevent reflux induced inflammation:   A. Slowly taper caffeine to one per day  B. Replace all throat clearing with drinking / swallowing maneuver  C. Increase Omeprazole  40 mg - 1 tablet 2 times per day  4. If needed:   A. Albuterol +Fluticasone 44 - 2 inhalations TOGETHER every 4-6 hours  B. Cetirizine 10 mg - 1 tablet 1 time per day  5. Influenza = Tamiflu. Covid = Paxlovid  6. Collect esophageal biopsy results from previous upper endoscopy

## 2024-03-06 ENCOUNTER — Encounter: Payer: Self-pay | Admitting: Allergy and Immunology

## 2024-03-07 ENCOUNTER — Ambulatory Visit (INDEPENDENT_AMBULATORY_CARE_PROVIDER_SITE_OTHER): Admitting: Psychiatry

## 2024-03-07 ENCOUNTER — Other Ambulatory Visit (HOSPITAL_COMMUNITY): Payer: Self-pay

## 2024-03-07 DIAGNOSIS — F411 Generalized anxiety disorder: Secondary | ICD-10-CM

## 2024-03-07 DIAGNOSIS — F331 Major depressive disorder, recurrent, moderate: Secondary | ICD-10-CM | POA: Diagnosis not present

## 2024-03-07 MED ORDER — HYDROXYZINE HCL 25 MG PO TABS
25.0000 mg | ORAL_TABLET | Freq: Two times a day (BID) | ORAL | 1 refills | Status: DC | PRN
  Filled 2024-03-07: qty 60, 30d supply, fill #0
  Filled 2024-03-27 – 2024-04-03 (×2): qty 60, 30d supply, fill #1

## 2024-03-07 NOTE — Progress Notes (Signed)
 [facility] [address] [place] [address] Dept: [phone] Dept Fax: [phone]  Psychotherapy Progress Note  Patient ID: Patty Stanley, female  DOB: 11/16/1980, 43 y.o.  MRN: [id number]  03/07/2024 Start time: 8AM End time: 9AM  Method of Visit: Face-to-Face  Present: patient  Current Concerns: relaxation techniques  Current Symptoms: None  Psychiatric Specialty Exam: General Appearance: appears at stated age, casually dressed and groomed   Behavior: pleasant and cooperative   Psychomotor Activity: no psychomotor agitation or retardation noted   Eye Contact: fair  Speech: normal amount, volume and fluency    Mood: euthymic  Affect: congruent, pleasant and interactive   Thought Process: linear, goal directed, no circumstantial or tangential thought process noted, no racing thoughts or flight of ideas  Descriptions of Associations: intact   Thought Content Hallucinations: denies AH, VH , does not appear responding to stimuli  Delusions: no paranoia, delusions of control, grandeur, ideas of reference, thought broadcasting, and magical thinking  Suicidal Thoughts: denies SI, intention, plan  Homicidal Thoughts: denies HI, intention, plan   Alertness/Orientation: alert and fully oriented   Insight: fair Judgment: fair  Memory: intact   Executive Functions  Concentration: intact  Attention Span: fair  Recall: intact  Fund of Knowledge: fair    Diagnosis: MDD  Anticipated Frequency of Visits: every other week Anticipated Length of Treatment Episode: 6 months  Short Term Goals/Goals for Treatment Session:  -- Working through [employer] through a thought record analysis discussing the situation where her husband's sister needed her car fixed and they went to the automobile dealership and the worker there was taking a long time and it seemed that they did not know what they were doing.  The  patient then had the thought of what is taking so long and he did not know what he was doing so interns she felt angry.  She stated that her alternate thought was realizing that it would not help the situation if everybody was angry so she did something to distract herself like playing on her tablet and she stated that helped her stay more calm.  Also discussed other techniques like 5 senses for mindfulness and deep breathing techniques.  Discussed that breathing through the diaphragm is most effective and recommended the breathing technique with thinking of a safe word simultaneously.  Progress Towards Goals: Progressing as evidenced to completing a thought record analysis in the interim  Treatment Intervention: Cognitive Behavioral therapy  Medical Necessity: Improved patient condition  --Patient requested refill for her hydroxyzine  25 mg as needed, refilled her medications to bridge her to her next medication management appointment.  She states that she takes 25 mg in the morning and at night and a 10 mg tablet as needed during the day.  Assessment Tools:    09/06/2023    3:30 PM 06/07/2023    3:22 PM 02/22/2023   11:17 AM  Depression screen PHQ 2/9  Decreased Interest 0 2 0  Down, Depressed, Hopeless 0 2 1  PHQ - 2 Score 0 4 1  Altered sleeping 2 3 1   Tired, decreased energy 2 2 1   Change in appetite 0 2 2  Feeling bad or failure about yourself  1 2 1   Trouble concentrating 1 2 3   Moving slowly or fidgety/restless 0 3 2  Suicidal thoughts 0 0 0  PHQ-9 Score 6 18 11   Difficult doing work/chores Not difficult at all  Somewhat difficult   Failed to redirect  to the Timeline version of the REVFS SmartLink. Flowsheet Row Counselor from 02/22/2023 in [facility] Office Visit from 02/10/2022 in [facility] Office Visit from 01/19/2022 in [facility]  C-SSRS RISK CATEGORY No Risk Low Risk Low Risk     Collaboration of Care: Psychiatrist AEB myself  Patient/Guardian was advised Release of Information must be obtained prior to any record release in order to collaborate their care with an outside provider. Patient/Guardian was advised if they have not already done so to contact the registration department to sign all necessary forms in order for us  to release information regarding their care.   Consent: Patient/Guardian gives verbal consent for treatment and assignment of benefits for services provided during this visit. Patient/Guardian expressed understanding and agreed to proceed.   Plan: f/u in 2 weeks, practice 1 relaxation technique either deep breathing or the 5 senses and completing 1 thought record analysis  Ismael KATHEE Franco, MD 03/07/2024

## 2024-03-12 ENCOUNTER — Encounter: Payer: Self-pay | Admitting: Allergy and Immunology

## 2024-03-12 ENCOUNTER — Ambulatory Visit (INDEPENDENT_AMBULATORY_CARE_PROVIDER_SITE_OTHER): Admitting: Allergy and Immunology

## 2024-03-12 DIAGNOSIS — J4530 Mild persistent asthma, uncomplicated: Secondary | ICD-10-CM | POA: Diagnosis not present

## 2024-03-12 DIAGNOSIS — J301 Allergic rhinitis due to pollen: Secondary | ICD-10-CM | POA: Diagnosis not present

## 2024-03-13 ENCOUNTER — Encounter: Payer: Self-pay | Admitting: Allergy and Immunology

## 2024-03-13 ENCOUNTER — Other Ambulatory Visit: Payer: Self-pay

## 2024-03-13 ENCOUNTER — Other Ambulatory Visit (HOSPITAL_COMMUNITY): Payer: Self-pay

## 2024-03-13 ENCOUNTER — Other Ambulatory Visit (HOSPITAL_COMMUNITY): Payer: Self-pay | Admitting: Psychiatry

## 2024-03-13 DIAGNOSIS — F331 Major depressive disorder, recurrent, moderate: Secondary | ICD-10-CM

## 2024-03-13 DIAGNOSIS — F411 Generalized anxiety disorder: Secondary | ICD-10-CM

## 2024-03-13 NOTE — Progress Notes (Signed)
 Patty Stanley presents to this clinic for skin testing.  Allergy skin testing identified hypersensitivity against tree and grass pollen.  Her histamine control was 0.  Allergen avoidance measures given.

## 2024-03-18 ENCOUNTER — Encounter: Payer: Self-pay | Admitting: Radiology

## 2024-03-27 ENCOUNTER — Other Ambulatory Visit (HOSPITAL_COMMUNITY): Payer: Self-pay

## 2024-03-27 ENCOUNTER — Other Ambulatory Visit: Payer: Self-pay

## 2024-03-29 ENCOUNTER — Other Ambulatory Visit (HOSPITAL_COMMUNITY): Payer: Self-pay

## 2024-04-03 ENCOUNTER — Other Ambulatory Visit (HOSPITAL_COMMUNITY): Payer: Self-pay

## 2024-04-03 ENCOUNTER — Other Ambulatory Visit: Payer: Self-pay | Admitting: Allergy and Immunology

## 2024-04-03 DIAGNOSIS — K219 Gastro-esophageal reflux disease without esophagitis: Secondary | ICD-10-CM

## 2024-04-03 MED ORDER — OMEPRAZOLE 40 MG PO CPDR
40.0000 mg | DELAYED_RELEASE_CAPSULE | Freq: Two times a day (BID) | ORAL | 5 refills | Status: AC
  Filled 2024-04-03 – 2024-04-04 (×3): qty 60, 30d supply, fill #0
  Filled 2024-04-27: qty 60, 30d supply, fill #1
  Filled 2024-05-27: qty 60, 30d supply, fill #2

## 2024-04-04 ENCOUNTER — Other Ambulatory Visit (HOSPITAL_COMMUNITY): Payer: Self-pay

## 2024-04-04 MED ORDER — MONTELUKAST SODIUM 10 MG PO TABS
10.0000 mg | ORAL_TABLET | Freq: Every evening | ORAL | 1 refills | Status: AC
  Filled 2024-04-04: qty 90, 90d supply, fill #0

## 2024-04-09 ENCOUNTER — Other Ambulatory Visit (HOSPITAL_COMMUNITY): Payer: Self-pay

## 2024-04-09 ENCOUNTER — Ambulatory Visit (INDEPENDENT_AMBULATORY_CARE_PROVIDER_SITE_OTHER): Admitting: Allergy and Immunology

## 2024-04-09 ENCOUNTER — Other Ambulatory Visit: Payer: Self-pay

## 2024-04-09 ENCOUNTER — Ambulatory Visit: Admitting: Allergy and Immunology

## 2024-04-09 VITALS — BP 120/78 | HR 73 | Temp 98.0°F | Ht 61.5 in | Wt 174.8 lb

## 2024-04-09 DIAGNOSIS — J4530 Mild persistent asthma, uncomplicated: Secondary | ICD-10-CM

## 2024-04-09 DIAGNOSIS — K219 Gastro-esophageal reflux disease without esophagitis: Secondary | ICD-10-CM | POA: Diagnosis not present

## 2024-04-09 DIAGNOSIS — J301 Allergic rhinitis due to pollen: Secondary | ICD-10-CM

## 2024-04-09 MED ORDER — OMEPRAZOLE 40 MG PO CPDR
40.0000 mg | DELAYED_RELEASE_CAPSULE | Freq: Two times a day (BID) | ORAL | 5 refills | Status: AC
  Filled 2024-04-09: qty 60, 30d supply, fill #0

## 2024-04-09 MED ORDER — FLUTICASONE PROPIONATE HFA 44 MCG/ACT IN AERO
2.0000 | INHALATION_SPRAY | Freq: Two times a day (BID) | RESPIRATORY_TRACT | 5 refills | Status: AC
  Filled 2024-04-09: qty 10.6, 30d supply, fill #0
  Filled 2024-05-08 – 2024-05-21 (×2): qty 10.6, 30d supply, fill #1
  Filled 2024-06-15: qty 10.6, 30d supply, fill #2

## 2024-04-09 MED ORDER — ALBUTEROL SULFATE HFA 108 (90 BASE) MCG/ACT IN AERS
2.0000 | INHALATION_SPRAY | RESPIRATORY_TRACT | 1 refills | Status: AC | PRN
  Filled 2024-04-09: qty 6.7, 25d supply, fill #0

## 2024-04-09 NOTE — Patient Instructions (Addendum)
  1. Allergen avoidance measure - tree / grass. Check area 2 aeroallegen profile, CBC w/d    2. Treat and prevent inflammation of airway:   A. Fluticasone  44 - 2 inhalations 1-2 times per day (empty lungs)  B. Fluticasone  - 1 spray each nostril 3-7 times per week  3. Treat and prevent reflux induced inflammation:   A. minimize caffeine consumption  B. Replace all throat clearing with drinking / swallowing maneuver  C. Omeprazole  40 mg - 1 tablet 2 times per day  4. If needed:   A. Albuterol +Fluticasone  44 - 2 inhalations TOGETHER every 4-6 hours  B. Cetirizine  10 mg - 1 tablet 1 time per day  5. Influenza = Tamiflu. Covid = Paxlovid  6. Immunotherapy if medical therapy does not work  7. Return to clinic in 12 weeks or earlier if problem

## 2024-04-09 NOTE — Progress Notes (Unsigned)
 [place] [facility] [place] [facility] [place] [facility] [place] [facility] [place]   Follow-up Note  Referring Provider: Gwenn Norris, MD Primary Provider: Gwenn Norris, MD Date of Office Visit: 04/09/2024  Subjective:   Patty Stanley (DOB: 1980/07/22) is a 43 y.o. female who [facility] in re-evaluation of the following:  HPI: Patty Stanley returns to this clinic in evaluation of asthma, allergic rhinitis, LPR, esophageal dysmotility.  I last saw her in this clinic 12 March 2024.  She is doing much better at this point in time.  This is the best that she has been in years.  She has no issues with her nose.  She no longer has any nasal congestion and sneezing.  She has been consistently using her nasal steroid about 3-4 times a week.  She has no problems with coughing, she has no problems with throat clearing or lump stuck in her throat or intermittent raspy voice, she has no posttussive emesis.  She has been consistently inhaling fluticasone  and consistently addressing her reflux with omeprazole  twice a day.  She has minimized her caffeine consumption from 8 cans of soda to 2 cans of soda per day.  Allergies as of 04/09/2024       Reactions   Penicillins Hives   REACTION: Itching   Garlic    Other Reaction(s): excessive coughing   Methocarbamol  Itching        Medication List    albuterol  108 (90 Base) MCG/ACT inhaler Commonly known as: VENTOLIN  HFA Inhale 1 puff by mouth into the lungs every 8 hours as needed   Arnuity Ellipta  100 MCG/ACT Aepb Generic drug: Fluticasone  Furoate Inhale 1 puff into the lungs daily.   budesonide -formoterol  160-4.5 MCG/ACT inhaler Commonly known as: SYMBICORT  Inhale 1 puff into the lungs twice daily as needed.   cetirizine  10 MG tablet Commonly known as: ZYRTEC  Take 1 tablet (10 mg total) by mouth daily.   ferrous sulfate  325 (65 FE) MG tablet Take 1 tablet (325 mg total) by mouth every other day.    fluticasone  50 MCG/ACT nasal spray Commonly known as: FLONASE  Place 2 sprays into both nostrils daily.   hydrOXYzine  25 MG tablet Commonly known as: ATARAX  Take 1 tablet (25 mg total) by mouth 2 (two) times daily as needed.   mirtazapine  30 MG tablet Commonly known as: Remeron  Take 0.5 tablets (15 mg total) by mouth at bedtime.   montelukast  10 MG tablet Commonly known as: SINGULAIR  Take 1 tablet (10 mg total) by mouth daily.   montelukast  10 MG tablet Commonly known as: Singulair  Take 1 tablet (10 mg total) by mouth every evening.   nebivolol  5 MG tablet Commonly known as: BYSTOLIC  TAKE 1 TABLET BY MOUTH ONCE A DAY   nebivolol  5 MG tablet Commonly known as: BYSTOLIC  Take 1 tablet (5 mg total) by mouth daily.   omeprazole  40 MG capsule Commonly known as: PRILOSEC Take 1 capsule (40 mg total) by mouth in the morning and at bedtime.   sertraline  100 MG tablet Commonly known as: ZOLOFT  Take 1.5 tablets (150 mg total) by mouth daily.   VyLibra  0.25-35 MG-MCG tablet Generic drug: norgestimate -ethinyl estradiol  TAKE 1 TABLET BY MOUTH ONCE DAILY CONTINUOUSLY AS DIRECTED   VyLibra  0.25-35 MG-MCG tablet Generic drug: norgestimate -ethinyl estradiol  Take 1 tablet by mouth once daily as directed-ok continuous dosing    Past Medical History:  Diagnosis Date   Alcohol abuse    ALLERGIC RHINITIS    Anxiety  ASTHMA    DEPRESSION    Headache(784.0)    OSTEOARTHRITIS    UNSPECIFIED TACHYCARDIA     Past Surgical History:  Procedure Laterality Date   WISDOM TOOTH EXTRACTION      Review of systems negative except as noted in HPI / PMHx or noted below:  Review of Systems  Constitutional: Negative.   HENT: Negative.    Eyes: Negative.   Respiratory: Negative.    Cardiovascular: Negative.   Gastrointestinal: Negative.   Genitourinary: Negative.   Musculoskeletal: Negative.   Skin: Negative.   Neurological: Negative.   Endo/Heme/Allergies: Negative.    Psychiatric/Behavioral: Negative.       Objective:   Vitals:   04/09/24 1546  BP: 120/78  Pulse: 73  Temp: 98 F (36.7 C)  SpO2: 96%   Height: 5' 1.5 (156.2 cm)  Weight: 174 lb 12.8 oz (79.3 kg)   Physical Exam Constitutional:      Appearance: She is not diaphoretic.  HENT:     Head: Normocephalic.     Right Ear: Tympanic membrane, ear canal and external ear normal.     Left Ear: Tympanic membrane, ear canal and external ear normal.     Nose: Nose normal. No mucosal edema or rhinorrhea.     Mouth/Throat:     Pharynx: Uvula midline. No oropharyngeal exudate.  Eyes:     Conjunctiva/sclera: Conjunctivae normal.  Neck:     Thyroid: No thyromegaly.     Trachea: Trachea normal. No tracheal tenderness or tracheal deviation.  Cardiovascular:     Rate and Rhythm: Normal rate and regular rhythm.     Heart sounds: Normal heart sounds, S1 normal and S2 normal. No murmur heard. Pulmonary:     Effort: No respiratory distress.     Breath sounds: Normal breath sounds. No stridor. No wheezing or rales.  Lymphadenopathy:     Head:     Right side of head: No tonsillar adenopathy.     Left side of head: No tonsillar adenopathy.     Cervical: No cervical adenopathy.  Skin:    Findings: No erythema or rash.     Nails: There is no clubbing.  Neurological:     Mental Status: She is alert.     Diagnostics: Spirometry was performed and demonstrated an FEV1 of 2.55 at 179 % of predicted.  Assessment and Plan:   1. Asthma, well controlled, mild persistent   2. Seasonal allergic rhinitis due to pollen   3. LPRD (laryngopharyngeal reflux disease)    1. Allergen avoidance measure - tree / grass. Check area 2 aeroallegen profile, CBC w/d    2. Treat and prevent inflammation of airway:   A. Fluticasone  44 - 2 inhalations 1-2 times per day (empty lungs)  B. Fluticasone  - 1 spray each nostril 3-7 times per week  3. Treat and prevent reflux induced inflammation:   A. minimize  caffeine consumption  B. Replace all throat clearing with drinking / swallowing maneuver  C. Omeprazole  40 mg - 1 tablet 2 times per day  4. If needed:   A. Albuterol +Fluticasone  44 - 2 inhalations TOGETHER every 4-6 hours  B. Cetirizine  10 mg - 1 tablet 1 time per day  5. Influenza = Tamiflu. Covid = Paxlovid  6. Immunotherapy if medical therapy does not work  7. Return to clinic in 12 weeks or earlier if problem  Bettyjean is doing much better on her current plan.  Whether or not she is going to do this well as  she goes through the spring pollination season is a question that needs to be answered.  For now we are allow her to use her inhaled fluticasone  just 1 time per day and her nasal fluticasone  about 3 times per week while she continues to aggressively treat her LPR with omeprazole  twice a day.  We will further define her aeroallergen hypersensitivity given the fact that her previous skin testing was associated with a negative histamine even though she had positive tree and grass results.  I will contact her with the results of her blood test once they are available for review.  We will give her immunotherapy if she fails medical therapy.  Camellia Denis, MD Allergy  / Immunology [facility]

## 2024-04-10 ENCOUNTER — Encounter: Payer: Self-pay | Admitting: Allergy and Immunology

## 2024-04-10 ENCOUNTER — Other Ambulatory Visit (HOSPITAL_COMMUNITY): Payer: Self-pay

## 2024-04-12 ENCOUNTER — Other Ambulatory Visit (HOSPITAL_COMMUNITY): Payer: Self-pay

## 2024-04-13 LAB — ALLERGENS W/TOTAL IGE AREA 2
Alternaria Alternata IgE: <0.1 kU/L
Aspergillus Fumigatus IgE: <0.1 kU/L
Bermuda Grass IgE: <0.1 kU/L
Cat Dander IgE: <0.1 kU/L
Cedar, Mountain IgE: <0.1 kU/L
Cladosporium Herbarum IgE: <0.1 kU/L
Cockroach, German IgE: <0.1 kU/L
Common Silver Birch IgE: 0.29 kU/L — AB
Cottonwood IgE: <0.1 kU/L
D Farinae IgE: <0.1 kU/L
D Pteronyssinus IgE: <0.1 kU/L
Dog Dander IgE: <0.1 kU/L
Elm, American IgE: <0.1 kU/L
IgE (Immunoglobulin E), Serum: 11 [IU]/mL (ref 6–495)
Johnson Grass IgE: <0.1 kU/L
Maple/Box Elder IgE: <0.1 kU/L
Mouse Urine IgE: <0.1 kU/L
Oak, White IgE: 0.55 kU/L — AB
Pecan, Hickory IgE: 0.26 kU/L — AB
Penicillium Chrysogen IgE: <0.1 kU/L
Pigweed, Rough IgE: <0.1 kU/L
Ragweed, Short IgE: <0.1 kU/L
Sheep Sorrel IgE Qn: <0.1 kU/L
Timothy Grass IgE: <0.1 kU/L
White Mulberry IgE: <0.1 kU/L

## 2024-04-13 LAB — CBC WITH DIFFERENTIAL/PLATELET
Basophils Absolute: 0.1 x10E3/uL (ref 0.0–0.2)
Basos: 1 %
EOS (ABSOLUTE): 0.2 x10E3/uL (ref 0.0–0.4)
Eos: 4 %
Hematocrit: 36.3 % (ref 34.0–46.6)
Hemoglobin: 12.1 g/dL (ref 11.1–15.9)
Immature Grans (Abs): 0 x10E3/uL (ref 0.0–0.1)
Immature Granulocytes: 0 %
Lymphocytes Absolute: 1.9 x10E3/uL (ref 0.7–3.1)
Lymphs: 36 %
MCH: 28.9 pg (ref 26.6–33.0)
MCHC: 33.3 g/dL (ref 31.5–35.7)
MCV: 87 fL (ref 79–97)
Monocytes Absolute: 0.4 x10E3/uL (ref 0.1–0.9)
Monocytes: 7 %
Neutrophils Absolute: 2.7 x10E3/uL (ref 1.4–7.0)
Neutrophils: 52 %
Platelets: 274 x10E3/uL (ref 150–450)
RBC: 4.18 x10E6/uL (ref 3.77–5.28)
RDW: 13.4 % (ref 11.7–15.4)
WBC: 5.2 x10E3/uL (ref 3.4–10.8)

## 2024-04-15 ENCOUNTER — Ambulatory Visit: Payer: Self-pay | Admitting: Allergy and Immunology
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
## 2024-04-16 ENCOUNTER — Other Ambulatory Visit (HOSPITAL_COMMUNITY): Payer: Self-pay

## 2024-04-18 ENCOUNTER — Ambulatory Visit (INDEPENDENT_AMBULATORY_CARE_PROVIDER_SITE_OTHER): Admitting: Psychiatry

## 2024-04-18 DIAGNOSIS — F411 Generalized anxiety disorder: Secondary | ICD-10-CM | POA: Diagnosis not present

## 2024-04-18 NOTE — Progress Notes (Signed)
  BE[facility]IORAL HEALTH HOSP[facility] [address] [place] [address] Dept: [phone] Dept Fax: [phone]  Psychotherapy Progress Note  Patient ID: Patty Stanley, female  DOB: March 28, 1981, 43 y.o.  MRN: [id number]  04/18/2024 Start time: 8 AM End time: 9 AM  Method of Visit: Face-to-Face  Present: patient  Current Concerns: anger at store employee  Current Symptoms: Irritability  Psychiatric Specialty Exam: General Appearance: Fairly Groomed  Eye Contact:  Fair  Speech:  Clear and Coherent  Volume:  Normal  Mood:  Irritable  Affect:  Congruent  Thought Process:  Coherent  Orientation:  Full (Time, Place, and Person)  Thought Content:  Logical  Suicidal Thoughts:  No  Homicidal Thoughts:  No  Memory:  Remote;   Good  Judgement:  Fair  Insight:  Fair  Psychomotor Activity:  Normal  Concentration:  Concentration: Fair  Recall:  Fair  Fund of Knowledge:Fair  Language: Fair  Akathisia:  No  Handed:  Right  AIMS (if indicated):  not done  Assets:  Desire for Improvement Intimacy Leisure Time Transportation  ADL's:  Intact  Cognition: WNL  Sleep:  Fair     Diagnosis: MDD, GAD  Anticipated Frequency of Visits: every 2 weeks Anticipated Length of Treatment Episode: 6 months  Short Term Goals/Goals for Treatment Session:   1) decrease anger 2) identify anger triggers 3) confront anger inducing thoughts 4) use coping strategies:  (deep breathing, diversion, freeze frame, visualization, muscle relaxation)   Progress Towards Goals: Progressing going through thought record analysis during session  Treatment Intervention: Cognitive Behavioral therapy  Medical Necessity: Improved patient condition  Assessment Tools:    09/06/2023    3:30 PM 06/07/2023    3:22 PM 02/22/2023   11:17 AM  Depression screen PHQ 2/9  Decreased Interest 0 2 0  Down, Depressed, Hopeless 0 2 1  PHQ - 2 Score 0 4 1  Altered sleeping 2 3 1    Tired, decreased energy 2 2 1   Change in appetite 0 2 2  Feeling bad or failure about yourself  1 2 1   Trouble concentrating 1 2 3   Moving slowly or fidgety/restless 0 3 2  Suicidal thoughts 0 0 0  PHQ-9 Score 6  18  11    Difficult doing work/chores Not difficult at all  Somewhat difficult     Data saved with a previous flowsheet row definition   Failed to redirect to the Timeline version of the REVFS SmartLink. Flowsheet Row Counselor from 02/22/2023 in [facility] Office Visit from 02/10/2022 in [facility] Office Visit from 01/19/2022 in [facility]  C-SSRS RISK CATEGORY No Risk Low Risk Low Risk     Patient/Guardian was advised Release of Information must be obtained prior to any record release in order to collaborate their care with an outside provider. Patient/Guardian was advised if they have not already done so to contact the registration department to sign all necessary forms in order for us  to release information regarding their care.   Consent: Patient/Guardian gives verbal consent for treatment and assignment of benefits for services provided during this visit. Patient/Guardian expressed understanding and agreed to proceed.   Plan: f/u in 1 week, complete 1 thought record analysis, next appt is last therapy appt  Ismael KATHEE Franco, MD 04/18/2024

## 2024-04-22 ENCOUNTER — Other Ambulatory Visit (HOSPITAL_COMMUNITY): Payer: Self-pay | Admitting: Psychiatry

## 2024-04-22 ENCOUNTER — Other Ambulatory Visit (HOSPITAL_COMMUNITY): Payer: Self-pay

## 2024-04-22 DIAGNOSIS — F331 Major depressive disorder, recurrent, moderate: Secondary | ICD-10-CM

## 2024-04-22 DIAGNOSIS — F411 Generalized anxiety disorder: Secondary | ICD-10-CM

## 2024-04-22 MED ORDER — MIRTAZAPINE 30 MG PO TABS
15.0000 mg | ORAL_TABLET | Freq: Every day | ORAL | 0 refills | Status: DC
  Filled 2024-04-22: qty 30, 60d supply, fill #0

## 2024-04-25 ENCOUNTER — Other Ambulatory Visit: Payer: Self-pay

## 2024-04-25 ENCOUNTER — Other Ambulatory Visit (HOSPITAL_COMMUNITY): Payer: Self-pay

## 2024-04-25 ENCOUNTER — Encounter (HOSPITAL_COMMUNITY): Admitting: Psychiatry

## 2024-04-25 VITALS — BP 118/71 | Wt 170.0 lb

## 2024-04-25 DIAGNOSIS — F411 Generalized anxiety disorder: Secondary | ICD-10-CM

## 2024-04-25 DIAGNOSIS — F331 Major depressive disorder, recurrent, moderate: Secondary | ICD-10-CM

## 2024-04-25 DIAGNOSIS — F79 Unspecified intellectual disabilities: Secondary | ICD-10-CM

## 2024-04-25 MED ORDER — MIRTAZAPINE 30 MG PO TABS
15.0000 mg | ORAL_TABLET | Freq: Every day | ORAL | 0 refills | Status: AC
  Filled 2024-04-25 – 2024-06-21 (×2): qty 45, 90d supply, fill #0

## 2024-04-25 MED ORDER — SERTRALINE HCL 100 MG PO TABS
150.0000 mg | ORAL_TABLET | Freq: Every day | ORAL | 1 refills | Status: AC
  Filled 2024-04-25: qty 45, 30d supply, fill #0
  Filled 2024-05-31: qty 45, 30d supply, fill #1

## 2024-04-25 MED ORDER — HYDROXYZINE HCL 25 MG PO TABS
25.0000 mg | ORAL_TABLET | Freq: Two times a day (BID) | ORAL | 1 refills | Status: AC
  Filled 2024-04-25: qty 60, 30d supply, fill #0
  Filled 2024-05-31: qty 60, 30d supply, fill #1

## 2024-04-25 MED ORDER — HYDROXYZINE HCL 10 MG PO TABS
10.0000 mg | ORAL_TABLET | Freq: Every day | ORAL | 1 refills | Status: AC | PRN
  Filled 2024-04-25: qty 30, 30d supply, fill #0

## 2024-04-26 ENCOUNTER — Other Ambulatory Visit (HOSPITAL_COMMUNITY): Payer: Self-pay

## 2024-04-30 ENCOUNTER — Other Ambulatory Visit (HOSPITAL_COMMUNITY): Payer: Self-pay

## 2024-05-02 ENCOUNTER — Ambulatory Visit (INDEPENDENT_AMBULATORY_CARE_PROVIDER_SITE_OTHER): Admitting: Psychiatry

## 2024-05-02 DIAGNOSIS — F411 Generalized anxiety disorder: Secondary | ICD-10-CM

## 2024-05-02 NOTE — Progress Notes (Signed)
 BEHAVIORAL HEALTH HOSPITAL [facility] [address] [place] [address] Dept: [phone] Dept Fax: [phone]  Psychotherapy Progress Note  Patient ID: Patty Stanley, female  DOB: 04-Dec-1980, 43 y.o.  MRN: [id number]  05/02/2024 Start time: 8am End time: 9am  Method of Visit: Face-to-Face  Present: patient  Current Concerns: not being upset when partner is upset  Current Symptoms: Anxiety  Psychiatric Specialty Exam: General Appearance: appears at stated age, casually dressed and groomed   Behavior: pleasant and cooperative   Psychomotor Activity: no psychomotor agitation or retardation noted   Eye Contact: fair  Speech: normal amount, volume and fluency    Mood: anxious  Affect: congruent, pleasant and interactive   Thought Process: linear, goal directed, no circumstantial or tangential thought process noted, no racing thoughts or flight of ideas  Descriptions of Associations: intact   Thought Content Hallucinations: denies AH, VH , does not appear responding to stimuli  Delusions: no paranoia, delusions of control, grandeur, ideas of reference, thought broadcasting, and magical thinking  Suicidal Thoughts: denies SI, intention, plan  Homicidal Thoughts: denies HI, intention, plan   Alertness/Orientation: alert and fully oriented   Insight: fair Judgment: fair  Memory: intact   Executive Functions  Concentration: intact  Attention Span: fair  Recall: intact  Fund of Knowledge: fair    Diagnosis: GAD  Anticipated Frequency of Visits: every 2 weeks Anticipated Length of Treatment Episode: 6 months  Short Term Goals/Goals for Treatment Session:    1) decrease anxiety - talked about stress with not having a sewing machine 2) resist flight/freeze response 3) identify anxiety inducing thoughts 4) use relaxation strategies (deep breathing, visualization, cognitive cueing, muscle relaxation) - discussed activities  she enjoys like shopping, sewing, and playing with cats 5) activity chart    Progress Towards Goals: Not Progressing aeb not completing her thought record sheet  Treatment Intervention: Cognitive Behavioral therapy  Medical Necessity: Improved patient condition  Assessment Tools:    09/06/2023    3:30 PM 06/07/2023    3:22 PM 02/22/2023   11:17 AM  Depression screen PHQ 2/9  Decreased Interest 0 2 0  Down, Depressed, Hopeless 0 2 1  PHQ - 2 Score 0 4 1  Altered sleeping 2 3 1   Tired, decreased energy 2 2 1   Change in appetite 0 2 2  Feeling bad or failure about yourself  1 2 1   Trouble concentrating 1 2 3   Moving slowly or fidgety/restless 0 3 2  Suicidal thoughts 0 0 0  PHQ-9 Score 6  18  11    Difficult doing work/chores Not difficult at all  Somewhat difficult     Data saved with a previous flowsheet row definition   Failed to redirect to the Timeline version of the REVFS SmartLink. Flowsheet Row Counselor from 02/22/2023 in [facility] Office Visit from 02/10/2022 in [facility] Office Visit from 01/19/2022 in [facility]  C-SSRS RISK CATEGORY No Risk Low Risk Low Risk     Patient/Guardian was advised Release of Information must be obtained prior to any record release in order to collaborate their care with an outside provider. Patient/Guardian was advised if they have not already done so to contact the registration department to sign all necessary forms in order for us  to release information regarding their care.   Consent: Patient/Guardian gives verbal consent for treatment and assignment of benefits for services provided during this visit. Patient/Guardian expressed understanding and agreed  to proceed.   Plan: complete 50% of activity chart, f/u in one month (likely final session)  Ismael KATHEE Franco, MD 05/02/2024

## 2024-05-08 ENCOUNTER — Other Ambulatory Visit: Payer: Self-pay

## 2024-05-08 ENCOUNTER — Other Ambulatory Visit (HOSPITAL_COMMUNITY): Payer: Self-pay

## 2024-05-14 ENCOUNTER — Other Ambulatory Visit (HOSPITAL_COMMUNITY): Payer: Self-pay

## 2024-05-14 MED ORDER — PREDNISONE 20 MG PO TABS
ORAL_TABLET | ORAL | 0 refills | Status: AC
  Filled 2024-05-14: qty 10, 5d supply, fill #0

## 2024-05-14 MED ORDER — PROMETHAZINE-DM 6.25-15 MG/5ML PO SYRP
ORAL_SOLUTION | ORAL | 0 refills | Status: AC
  Filled 2024-05-14: qty 140, 7d supply, fill #0

## 2024-05-15 ENCOUNTER — Other Ambulatory Visit (HOSPITAL_COMMUNITY): Payer: Self-pay

## 2024-05-20 ENCOUNTER — Other Ambulatory Visit (HOSPITAL_COMMUNITY): Payer: Self-pay

## 2024-05-21 ENCOUNTER — Telehealth: Payer: Self-pay

## 2024-05-21 ENCOUNTER — Other Ambulatory Visit (HOSPITAL_COMMUNITY): Payer: Self-pay

## 2024-05-21 NOTE — Telephone Encounter (Signed)
*  AA  Pharmacy Patient Advocate Encounter   Received notification from Pt Calls Messages that prior authorization for Fluticasone  44mcg inhhaler is required/requested.   Insurance verification completed.   The patient is insured through [facility].   Per test claim:  Arnuity is preferred by the insurance.  If suggested medication is appropriate, Please send in a new RX and discontinue this one. If not, please advise as to why it's not appropriate so that we may request a Prior Authorization. Please note, some preferred medications may still require a PA.  If the suggested medications have not been trialed and there are no contraindications to their use, the PA will not be submitted, as it will not be approved.   Key: [id number]

## 2024-05-21 NOTE — Telephone Encounter (Signed)
 PA submitted to plan for Fluticasone  Inhaler

## 2024-05-21 NOTE — Telephone Encounter (Signed)
 Your request has been approved Approved. This drug has been approved under the Member's Medicare Part D benefit. Approved quantity: 10.6 units per 30 day(s). You may fill up to a 90 day supply except for those on Specialty Tier 5, which can be filled up to a 30 day supply. Please call the pharmacy to process the prescription claim. Authorization Expiration12/31/2099

## 2024-05-30 ENCOUNTER — Ambulatory Visit (INDEPENDENT_AMBULATORY_CARE_PROVIDER_SITE_OTHER): Admitting: Psychiatry

## 2024-05-30 DIAGNOSIS — F411 Generalized anxiety disorder: Secondary | ICD-10-CM | POA: Diagnosis not present

## 2024-05-30 NOTE — Progress Notes (Signed)
 " BEHAVIORAL [facility] [address] [place] [address] Dept: [phone] Dept Fax: [phone]  Psychotherapy Progress Note  Patient ID: Patty Stanley, female  DOB: 1980/07/18, 44 y.o.  MRN: [id number]  05/30/2024 Start time: 8 AM End time: 8:30 AM  Method of Visit: Face-to-Face  Present: patient  Current Concerns: trying to keep herself calm  Current Symptoms: Anxiety  Psychiatric Specialty Exam: General Appearance: Casual  Eye Contact:  Good  Speech:  Normal Rate  Volume:  Normal  Mood:  Anxious  Affect:  Congruent  Thought Process:  Coherent  Orientation:  Full (Time, Place, and Person)  Thought Content:  WDL  Suicidal Thoughts:  No  Homicidal Thoughts:  No  Memory:  Remote;   Good  Judgement:  Fair  Insight:  Fair  Psychomotor Activity:  Normal  Concentration:  Concentration: Fair  Recall:  Fair  Fund of Knowledge:Fair  Language: Fair  Akathisia:  No  Handed:  Right  AIMS (if indicated):  not done  Assets:  Communication Skills Desire for Improvement Housing Leisure Time Resilience Social Support Talents/Skills Transportation  ADL's:  Intact  Cognition: WNL  Sleep:  Good    Diagnosis: GAD  Anticipated Frequency of Visits: every other week Anticipated Length of Treatment Episode: 6 months  Short Term Goals/Goals for Treatment Session:   - decrease anxiety - catch, check, change thoughts  - reflected back on prior thought records and how this is helpful with anxiety and anger - identify emotion and severity - constructing healthier behaviors to improve outcome - use relaxation strategies (deep breathing, visualization, cognitive cueing, muscle relaxation)  - states that she enjoys deep breathing technique  Progress Towards Goals: Progressing AEB being able to stay calmer in triggering situations  Treatment Intervention: Cognitive Behavioral therapy  Medical Necessity: Improved patient  condition  Assessment Tools:    09/06/2023    3:30 PM 06/07/2023    3:22 PM 02/22/2023   11:17 AM  Depression screen PHQ 2/9  Decreased Interest 0 2 0  Down, Depressed, Hopeless 0 2 1  PHQ - 2 Score 0 4 1  Altered sleeping 2 3 1   Tired, decreased energy 2 2 1   Change in appetite 0 2 2  Feeling bad or failure about yourself  1 2 1   Trouble concentrating 1 2 3   Moving slowly or fidgety/restless 0 3 2  Suicidal thoughts 0 0 0  PHQ-9 Score 6  18  11    Difficult doing work/chores Not difficult at all  Somewhat difficult     Data saved with a previous flowsheet row definition   Failed to redirect to the Timeline version of the REVFS SmartLink. Flowsheet Row Counselor from 02/22/2023 in [facility] Office Visit from 02/10/2022 in [facility] Office Visit from 01/19/2022 in [facility]  C-SSRS RISK CATEGORY No Risk Low Risk Low Risk    Collaboration of Care: Psychiatrist AEB myself  Patient/Guardian was advised Release of Information must be obtained prior to any record release in order to collaborate their care with an outside provider. Patient/Guardian was advised if they have not already done so to contact the registration department to sign all necessary forms in order for us  to release information regarding their care.   Consent: Patient/Guardian gives verbal consent for treatment and assignment of benefits for services provided during this visit. Patient/Guardian expressed understanding and agreed to proceed.   Plan: This is the final therapy  session. I will continue seeing her in medication management.  Ismael KATHEE Franco, MD 05/30/2024           "

## 2024-05-31 ENCOUNTER — Other Ambulatory Visit (HOSPITAL_COMMUNITY): Payer: Self-pay

## 2024-06-21 ENCOUNTER — Other Ambulatory Visit (HOSPITAL_COMMUNITY): Payer: Self-pay

## 2024-07-16 ENCOUNTER — Ambulatory Visit: Admitting: Allergy and Immunology

## 2024-07-25 ENCOUNTER — Encounter (HOSPITAL_COMMUNITY): Admitting: Psychiatry
